# Patient Record
Sex: Female | Born: 1965
Health system: Southern US, Community
[De-identification: ages and names within clinical notes are randomized; demographics above are authoritative.]

## PROBLEM LIST (undated history)

## (undated) DIAGNOSIS — I639 Cerebral infarction, unspecified: Secondary | ICD-10-CM

## (undated) DIAGNOSIS — I119 Hypertensive heart disease without heart failure: Secondary | ICD-10-CM

## (undated) DIAGNOSIS — E785 Hyperlipidemia, unspecified: Secondary | ICD-10-CM

## (undated) DIAGNOSIS — R7303 Prediabetes: Secondary | ICD-10-CM

## (undated) DIAGNOSIS — I10 Essential (primary) hypertension: Secondary | ICD-10-CM

## (undated) DIAGNOSIS — E669 Obesity, unspecified: Secondary | ICD-10-CM

## (undated) DIAGNOSIS — F419 Anxiety disorder, unspecified: Secondary | ICD-10-CM

## (undated) DIAGNOSIS — I21A1 Myocardial infarction type 2: Secondary | ICD-10-CM

## (undated) DIAGNOSIS — I671 Cerebral aneurysm, nonruptured: Secondary | ICD-10-CM

## (undated) DIAGNOSIS — D219 Benign neoplasm of connective and other soft tissue, unspecified: Secondary | ICD-10-CM

## (undated) DIAGNOSIS — D509 Iron deficiency anemia, unspecified: Secondary | ICD-10-CM

## (undated) DIAGNOSIS — N920 Excessive and frequent menstruation with regular cycle: Secondary | ICD-10-CM

## (undated) DIAGNOSIS — K7689 Other specified diseases of liver: Secondary | ICD-10-CM

## (undated) HISTORY — DX: Essential (primary) hypertension: I10

## (undated) HISTORY — DX: Anxiety disorder, unspecified: F41.9

## (undated) HISTORY — PX: CEREBRAL ANEURYSM REPAIR: SHX164

## (undated) HISTORY — DX: Iron deficiency anemia, unspecified: D50.9

## (undated) HISTORY — DX: Benign neoplasm of connective and other soft tissue, unspecified: D21.9

## (undated) HISTORY — DX: Hyperlipidemia, unspecified: E78.5

## (undated) HISTORY — DX: Hypertensive heart disease without heart failure: I11.9

## (undated) HISTORY — DX: Myocardial infarction type 2: I21.A1

## (undated) HISTORY — DX: Prediabetes: R73.03

## (undated) HISTORY — DX: Obesity, unspecified: E66.9

## (undated) HISTORY — DX: Cerebral infarction, unspecified: I63.9

## (undated) HISTORY — DX: Other specified diseases of liver: K76.89

## (undated) HISTORY — DX: Excessive and frequent menstruation with regular cycle: N92.0

## (undated) HISTORY — DX: Cerebral aneurysm, nonruptured: I67.1

---

## 1998-02-19 ENCOUNTER — Emergency Department (HOSPITAL_COMMUNITY): Admission: EM | Admit: 1998-02-19 | Discharge: 1998-02-19 | Payer: Self-pay | Admitting: Emergency Medicine

## 1998-09-06 ENCOUNTER — Emergency Department (HOSPITAL_COMMUNITY): Admission: EM | Admit: 1998-09-06 | Discharge: 1998-09-06 | Payer: Self-pay | Admitting: Internal Medicine

## 1999-07-21 ENCOUNTER — Inpatient Hospital Stay (HOSPITAL_COMMUNITY): Admission: AD | Admit: 1999-07-21 | Discharge: 1999-07-21 | Payer: Self-pay | Admitting: Obstetrics & Gynecology

## 2000-07-26 ENCOUNTER — Emergency Department (HOSPITAL_COMMUNITY): Admission: EM | Admit: 2000-07-26 | Discharge: 2000-07-26 | Payer: Self-pay | Admitting: Emergency Medicine

## 2003-05-01 ENCOUNTER — Emergency Department (HOSPITAL_COMMUNITY): Admission: EM | Admit: 2003-05-01 | Discharge: 2003-05-02 | Payer: Self-pay | Admitting: Emergency Medicine

## 2006-09-23 ENCOUNTER — Emergency Department (HOSPITAL_COMMUNITY): Admission: EM | Admit: 2006-09-23 | Discharge: 2006-09-23 | Payer: Self-pay | Admitting: Emergency Medicine

## 2007-08-11 ENCOUNTER — Inpatient Hospital Stay (HOSPITAL_COMMUNITY): Admission: AD | Admit: 2007-08-11 | Discharge: 2007-08-11 | Payer: Self-pay | Admitting: Obstetrics & Gynecology

## 2009-05-18 ENCOUNTER — Emergency Department (HOSPITAL_COMMUNITY): Admission: EM | Admit: 2009-05-18 | Discharge: 2009-05-18 | Payer: Self-pay | Admitting: Emergency Medicine

## 2013-01-12 ENCOUNTER — Encounter (HOSPITAL_COMMUNITY): Payer: Self-pay | Admitting: *Deleted

## 2013-01-12 ENCOUNTER — Emergency Department (HOSPITAL_COMMUNITY)
Admission: EM | Admit: 2013-01-12 | Discharge: 2013-01-12 | Disposition: A | Discharge status: Home / Self Care | Payer: Self-pay | Attending: Emergency Medicine | Admitting: Emergency Medicine

## 2013-01-12 ENCOUNTER — Emergency Department (HOSPITAL_COMMUNITY): Payer: Self-pay

## 2013-01-12 DIAGNOSIS — D649 Anemia, unspecified: Secondary | ICD-10-CM | POA: Insufficient documentation

## 2013-01-12 DIAGNOSIS — R0602 Shortness of breath: Secondary | ICD-10-CM | POA: Insufficient documentation

## 2013-01-12 DIAGNOSIS — R079 Chest pain, unspecified: Secondary | ICD-10-CM | POA: Insufficient documentation

## 2013-01-12 DIAGNOSIS — I10 Essential (primary) hypertension: Secondary | ICD-10-CM | POA: Insufficient documentation

## 2013-01-12 DIAGNOSIS — R1013 Epigastric pain: Secondary | ICD-10-CM | POA: Insufficient documentation

## 2013-01-12 LAB — CBC WITH DIFFERENTIAL/PLATELET
Basophils Absolute: 0.1 10*3/uL (ref 0.0–0.1)
Basophils Relative: 1 % (ref 0–1)
Eosinophils Absolute: 0.1 10*3/uL (ref 0.0–0.7)
Eosinophils Relative: 2 % (ref 0–5)
HCT: 30 % — ABNORMAL LOW (ref 36.0–46.0)
Hemoglobin: 7.9 g/dL — ABNORMAL LOW (ref 12.0–15.0)
Lymphocytes Relative: 45 % (ref 12–46)
Lymphs Abs: 3.2 10*3/uL (ref 0.7–4.0)
MCH: 15.6 pg — ABNORMAL LOW (ref 26.0–34.0)
MCHC: 26.3 g/dL — ABNORMAL LOW (ref 30.0–36.0)
MCV: 59.3 fL — ABNORMAL LOW (ref 78.0–100.0)
Monocytes Absolute: 0.4 10*3/uL (ref 0.1–1.0)
Monocytes Relative: 6 % (ref 3–12)
Neutro Abs: 3.4 10*3/uL (ref 1.7–7.7)
Neutrophils Relative %: 46 % (ref 43–77)
Platelets: 167 10*3/uL (ref 150–400)
RBC: 5.06 MIL/uL (ref 3.87–5.11)
RDW: 21.1 % — ABNORMAL HIGH (ref 11.5–15.5)
WBC: 7.2 10*3/uL (ref 4.0–10.5)

## 2013-01-12 LAB — COMPREHENSIVE METABOLIC PANEL
ALT: 14 U/L (ref 0–35)
AST: 19 U/L (ref 0–37)
Albumin: 3.5 g/dL (ref 3.5–5.2)
Alkaline Phosphatase: 63 U/L (ref 39–117)
BUN: 8 mg/dL (ref 6–23)
CO2: 23 mEq/L (ref 19–32)
Calcium: 9.4 mg/dL (ref 8.4–10.5)
Chloride: 102 mEq/L (ref 96–112)
Creatinine, Ser: 0.8 mg/dL (ref 0.50–1.10)
GFR calc Af Amer: >90 mL/min (ref >90)
GFR calc non Af Amer: 88 mL/min — ABNORMAL LOW (ref >90)
Glucose, Bld: 87 mg/dL (ref 70–99)
Potassium: 3.4 mEq/L — ABNORMAL LOW (ref 3.5–5.1)
Sodium: 136 mEq/L (ref 135–145)
Total Bilirubin: 0.3 mg/dL (ref 0.3–1.2)
Total Protein: 8 g/dL (ref 6.0–8.3)

## 2013-01-12 LAB — D-DIMER, QUANTITATIVE: D-Dimer, Quant: <0.27 ug/mL-FEU (ref 0.00–0.48)

## 2013-01-12 LAB — POCT I-STAT TROPONIN I
Troponin i, poc: 0 ng/mL (ref 0.00–0.08)
Troponin i, poc: 0 ng/mL (ref 0.00–0.08)

## 2013-01-12 LAB — OCCULT BLOOD, POC DEVICE: Fecal Occult Bld: NEGATIVE

## 2013-01-12 MED ORDER — ASPIRIN 81 MG PO CHEW
324.0000 mg | CHEWABLE_TABLET | Freq: Once | ORAL | Status: AC
  Administered 2013-01-12: 324 mg via ORAL
  Filled 2013-01-12: qty 4

## 2013-01-12 MED ORDER — GI COCKTAIL ~~LOC~~
30.0000 mL | Freq: Once | ORAL | Status: AC
  Administered 2013-01-12: 30 mL via ORAL
  Filled 2013-01-12: qty 30

## 2013-01-12 MED ORDER — FERROUS SULFATE 325 (65 FE) MG PO TABS: 325.0000 mg | ORAL_TABLET | Freq: Every day | ORAL | Status: DC

## 2013-01-12 NOTE — ED Notes (Signed)
Patient is alert and oriented x3.  She is complaining of chest pain that started today and has progressively gotten worse In the last hour.  She states that she has had this issue before but not this bad.  Currently she rates her pain 6 of 10.

## 2013-01-12 NOTE — ED Provider Notes (Signed)
CSN: [id number]     Arrival date & time 01/12/13  0013 History   First MD Initiated Contact with Patient 01/12/13 0035     Chief Complaint  Patient presents with  . Chest Pain   HPI  History provided by patient. Patient is a 47 year old female with prior history of cerebral aneurysm who presents with complaints of chest pain and discomfort. Symptoms first began earlier in the day and had been present through the evening tonight. Symptoms were initially somewhat mild and patient thought they may be related to gas and took 2 TUMS. She denied any skin relief. She does report 1 episode of severe pains with associated shortness of breath symptoms. Pain did radiate some to the back by the shoulder blades.  She did not have any nausea or diaphoresis. This lasted several minutes but currently her symptoms have improved. She denies any continued shortness of breath. There has been no cough or hemoptysis. She has not traveled anywhere recently or had any pain or swelling in the lower extremities. No prior history of DVT or PE. She has no prior history of similar symptoms. Denies any other aggravating or alleviating factors. No other associated symptoms.     No past medical history on file. Past Surgical History  Procedure Laterality Date  . Cerebral aneurysm repair     No family history on file. History  Substance Use Topics  . Smoking status: Never Smoker   . Smokeless tobacco: Not on file  . Alcohol Use: Yes     Comment: rare   OB History   Grav Para Term Preterm Abortions TAB SAB Ect Mult Living                 Review of Systems  Constitutional: Negative for fever, chills and diaphoresis.  Respiratory: Positive for shortness of breath.   Cardiovascular: Positive for chest pain. Negative for palpitations and leg swelling.  Gastrointestinal: Negative for nausea, vomiting, abdominal pain, diarrhea and constipation.  All other systems reviewed and are negative.    Allergies  Review of  patient's allergies indicates no known allergies.  Home Medications  No current outpatient prescriptions on file. BP 161/95  Pulse 75  Temp(Src) 98.7 F (37.1 C) (Oral)  Resp 14  SpO2 100%  LMP 12/15/2012 Physical Exam  Nursing note and vitals reviewed. Constitutional: She is oriented to person, place, and time. She appears well-developed and well-nourished. No distress.  HENT:  Head: Normocephalic and atraumatic.  Mouth/Throat: Oropharynx is clear and moist.  Eyes: Conjunctivae are normal.  Neck: Normal range of motion. Neck supple. No JVD present.  Cardiovascular: Normal rate and regular rhythm.   No murmur heard. Pulmonary/Chest: Effort normal and breath sounds normal. No respiratory distress. She has no wheezes. She has no rales.  Abdominal: Soft. She exhibits no distension. There is no rigidity, no rebound, no guarding, no CVA tenderness, no tenderness at McBurney's point and negative Murphy's sign.  Mild epigastric tenderness.  Musculoskeletal: Normal range of motion. She exhibits no edema and no tenderness.  No clinical signs concerning for DVT  Neurological: She is alert and oriented to person, place, and time.  Skin: Skin is warm and dry. No rash noted.  Psychiatric: She has a normal mood and affect. Her behavior is normal.    ED Course  Procedures   Results for orders placed during the hospital encounter of 01/12/13  CBC WITH DIFFERENTIAL      Result Value Range   WBC 7.2  4.0 -  10.5 K/uL   RBC 5.06  3.87 - 5.11 MIL/uL   Hemoglobin 7.9 (*) 12.0 - 15.0 g/dL   HCT 40.9 (*) 81.1 - 91.4 %   MCV 59.3 (*) 78.0 - 100.0 fL   MCH 15.6 (*) 26.0 - 34.0 pg   MCHC 26.3 (*) 30.0 - 36.0 g/dL   RDW 78.2 (*) 95.6 - 21.3 %   Platelets 167  150 - 400 K/uL   Neutrophils Relative % 46  43 - 77 %   Lymphocytes Relative 45  12 - 46 %   Monocytes Relative 6  3 - 12 %   Eosinophils Relative 2  0 - 5 %   Basophils Relative 1  0 - 1 %   Neutro Abs 3.4  1.7 - 7.7 K/uL   Lymphs Abs  3.2  0.7 - 4.0 K/uL   Monocytes Absolute 0.4  0.1 - 1.0 K/uL   Eosinophils Absolute 0.1  0.0 - 0.7 K/uL   Basophils Absolute 0.1  0.0 - 0.1 K/uL   RBC Morphology TEARDROP CELLS     Smear Review GIANT PLATELETS SEEN    COMPREHENSIVE METABOLIC PANEL      Result Value Range   Sodium 136  135 - 145 mEq/L   Potassium 3.4 (*) 3.5 - 5.1 mEq/L   Chloride 102  96 - 112 mEq/L   CO2 23  19 - 32 mEq/L   Glucose, Bld 87  70 - 99 mg/dL   BUN 8  6 - 23 mg/dL   Creatinine, Ser 0.86  0.50 - 1.10 mg/dL   Calcium 9.4  8.4 - 57.8 mg/dL   Total Protein 8.0  6.0 - 8.3 g/dL   Albumin 3.5  3.5 - 5.2 g/dL   AST 19  0 - 37 U/L   ALT 14  0 - 35 U/L   Alkaline Phosphatase 63  39 - 117 U/L   Total Bilirubin 0.3  0.3 - 1.2 mg/dL   GFR calc non Af Amer 88 (*) >90 mL/min   GFR calc Af Amer >90  >90 mL/min  D-DIMER, QUANTITATIVE      Result Value Range   D-Dimer, Quant <0.27  0.00 - 0.48 ug/mL-FEU  POCT I-STAT TROPONIN I      Result Value Range   Troponin i, poc 0.00  0.00 - 0.08 ng/mL   Comment 3           OCCULT BLOOD, POC DEVICE      Result Value Range   Fecal Occult Bld NEGATIVE  NEGATIVE  POCT I-STAT TROPONIN I      Result Value Range   Troponin i, poc 0.00  0.00 - 0.08 ng/mL   Comment 3              Imaging Review Dg Chest 2 View  01/12/2013   *RADIOLOGY REPORT*  Clinical Data: Chest pain times 1 day.  CHEST - 2 VIEW  Comparison: None available at time of study interpretation.  Findings: The cardiac silhouette is upper limits of normal, mediastinal silhouette is unremarkable.  The lungs are clear without pleural effusions or focal consolidations.  The pulmonary vasculature is unremarkable.   Trachea projects midline and there is no pneumothorax.  The included soft tissue planes and osseous structures are non suspicious; mild lower thoracic levoscoliosis with mild degenerative change.  IMPRESSION: Borderline cardiomegaly, no acute pulmonary process.   Original Report Authenticated By: Awilda Metro     MDM   1. Chest pain  2. Anemia   3. Hypertension      Patient seen and evaluated. The patient well-appearing no acute distress. Symptoms have been present all day long with some waxing and waning. Unrelieved with TUMS. Patient is PERC negative.  Patient without any other history of hypertension, diabetes or hypercholesterolemia. She is a nonsmoker. She does report an aunt who had cardiac history in her 78s. No other significant family history.  2:20AM Patient with low hemoglobin 7.9. I discussed finding with the patient. She reports past history of anemia and was told by her OB/GYN last year during her annual that she had a low hemoglobin level and to take iron. Patient does not take iron regularly. She does not report significantly heavy menstrual bleeding. She reports 4 days of visual. With the first 2 days being heavy bleeding. She has not noticed any dark stools or rectal bleeding. Will have nurse perform Hemoccult.  Labs have been unremarkable. Patient continues to be asymptomatic. At this time we'll discharge home with PCP and cardiology referral.     Date: 01/12/2013  Rate: 75  Rhythm: normal sinus rhythm  QRS Axis: normal  Intervals: normal  ST/T Wave abnormalities: normal  Conduction Disutrbances:none  Narrative Interpretation:   Old EKG Reviewed: none available      Angus Seller, PA-C 01/12/13 [phone]

## 2013-01-12 NOTE — ED Notes (Signed)
Patient transported to X-ray 

## 2013-01-12 NOTE — ED Notes (Signed)
Patient refused occult blood stool. Patient's hgb was discussed in addition to the necessity of the test and the patient states " I am always low, I was lower than that on my last physical. I do not want anything inserted into my rectum, I'm here for chest pain and I actually feel better". Attending will be informed.

## 2013-01-13 NOTE — ED Provider Notes (Signed)
Medical screening examination/treatment/procedure(s) were performed by non-physician practitioner and as supervising physician I was immediately available for consultation/collaboration.  Naylee Frankowski, MD 01/13/13 0027 

## 2013-07-29 ENCOUNTER — Encounter (HOSPITAL_COMMUNITY): Payer: Self-pay | Admitting: Emergency Medicine

## 2013-07-29 ENCOUNTER — Emergency Department (HOSPITAL_COMMUNITY)
Admission: EM | Admit: 2013-07-29 | Discharge: 2013-07-29 | Disposition: A | Discharge status: Home / Self Care | Payer: 59 | Attending: Emergency Medicine | Admitting: Emergency Medicine

## 2013-07-29 ENCOUNTER — Emergency Department (HOSPITAL_COMMUNITY): Payer: 59

## 2013-07-29 DIAGNOSIS — N201 Calculus of ureter: Secondary | ICD-10-CM

## 2013-07-29 DIAGNOSIS — Z862 Personal history of diseases of the blood and blood-forming organs and certain disorders involving the immune mechanism: Secondary | ICD-10-CM | POA: Insufficient documentation

## 2013-07-29 DIAGNOSIS — N83209 Unspecified ovarian cyst, unspecified side: Secondary | ICD-10-CM | POA: Insufficient documentation

## 2013-07-29 DIAGNOSIS — N83202 Unspecified ovarian cyst, left side: Secondary | ICD-10-CM

## 2013-07-29 DIAGNOSIS — R1032 Left lower quadrant pain: Secondary | ICD-10-CM

## 2013-07-29 DIAGNOSIS — Z3202 Encounter for pregnancy test, result negative: Secondary | ICD-10-CM | POA: Insufficient documentation

## 2013-07-29 LAB — COMPREHENSIVE METABOLIC PANEL
ALT: 12 U/L (ref 0–35)
AST: 18 U/L (ref 0–37)
Albumin: 3.1 g/dL — ABNORMAL LOW (ref 3.5–5.2)
Alkaline Phosphatase: 62 U/L (ref 39–117)
BUN: 9 mg/dL (ref 6–23)
CO2: 23 mEq/L (ref 19–32)
Calcium: 8.9 mg/dL (ref 8.4–10.5)
Chloride: 105 mEq/L (ref 96–112)
Creatinine, Ser: 0.88 mg/dL (ref 0.50–1.10)
GFR calc Af Amer: >90 mL/min (ref >90)
GFR calc non Af Amer: 78 mL/min — ABNORMAL LOW (ref >90)
Glucose, Bld: 117 mg/dL — ABNORMAL HIGH (ref 70–99)
Potassium: 3.6 mEq/L — ABNORMAL LOW (ref 3.7–5.3)
Sodium: 142 mEq/L (ref 137–147)
Total Bilirubin: <0.2 mg/dL — ABNORMAL LOW (ref 0.3–1.2)
Total Protein: 7.3 g/dL (ref 6.0–8.3)

## 2013-07-29 LAB — URINALYSIS, ROUTINE W REFLEX MICROSCOPIC
Bilirubin Urine: NEGATIVE
Glucose, UA: NEGATIVE mg/dL
Ketones, ur: 15 mg/dL — AB
Leukocytes, UA: NEGATIVE
Nitrite: NEGATIVE
Protein, ur: 30 mg/dL — AB
Specific Gravity, Urine: 1.034 — ABNORMAL HIGH (ref 1.005–1.030)
Urobilinogen, UA: 1 mg/dL (ref 0.0–1.0)
pH: 5.5 (ref 5.0–8.0)

## 2013-07-29 LAB — CBC
HCT: 31.2 % — ABNORMAL LOW (ref 36.0–46.0)
Hemoglobin: 9 g/dL — ABNORMAL LOW (ref 12.0–15.0)
MCH: 18.4 pg — ABNORMAL LOW (ref 26.0–34.0)
MCHC: 28.8 g/dL — ABNORMAL LOW (ref 30.0–36.0)
MCV: 63.7 fL — ABNORMAL LOW (ref 78.0–100.0)
Platelets: 309 10*3/uL (ref 150–400)
RBC: 4.9 MIL/uL (ref 3.87–5.11)
RDW: 19.5 % — ABNORMAL HIGH (ref 11.5–15.5)
WBC: 6.6 10*3/uL (ref 4.0–10.5)

## 2013-07-29 LAB — URINE MICROSCOPIC-ADD ON

## 2013-07-29 LAB — WET PREP, GENITAL
Trich, Wet Prep: NONE SEEN
Yeast Wet Prep HPF POC: NONE SEEN

## 2013-07-29 LAB — PREGNANCY, URINE: Preg Test, Ur: NEGATIVE

## 2013-07-29 LAB — GC/CHLAMYDIA PROBE AMP
CT Probe RNA: NEGATIVE
GC Probe RNA: NEGATIVE

## 2013-07-29 MED ORDER — HYDROCODONE-ACETAMINOPHEN 5-325 MG PO TABS: 1.0000 | ORAL_TABLET | ORAL | Status: DC | PRN

## 2013-07-29 MED ORDER — TAMSULOSIN HCL 0.4 MG PO CAPS: 0.4000 mg | ORAL_CAPSULE | Freq: Two times a day (BID) | ORAL | Status: DC

## 2013-07-29 MED ORDER — KETOROLAC TROMETHAMINE 60 MG/2ML IM SOLN
60.0000 mg | Freq: Once | INTRAMUSCULAR | Status: AC
  Administered 2013-07-29: 60 mg via INTRAMUSCULAR
  Filled 2013-07-29: qty 2

## 2013-07-29 MED ORDER — PROMETHAZINE HCL 25 MG PO TABS: 25.0000 mg | ORAL_TABLET | Freq: Four times a day (QID) | ORAL | Status: DC | PRN

## 2013-07-29 NOTE — ED Notes (Signed)
Patient states lower abdominal cramping and scant bleeding, patient states unusual for bleeding for her menstrual cycle, patient states d/c with odor, patient states she also feels constipated and like she is retaining water over past 3-4 days

## 2013-07-29 NOTE — ED Provider Notes (Signed)
CSN: [id number]     Arrival date & time 07/29/13  0422 History   First MD Initiated Contact with Patient 07/29/13 [id number]     Chief Complaint  Patient presents with  . Abdominal Pain    x 4 days  . Vaginal Bleeding     (Consider location/radiation/quality/duration/timing/severity/associated sxs/prior Treatment) Patient is a 48 y.o. female presenting with abdominal pain and vaginal bleeding. The history is provided by the patient and medical records. No language interpreter was used.  Abdominal Pain Associated symptoms: vaginal bleeding and vaginal discharge   Associated symptoms: no chest pain, no constipation, no cough, no diarrhea, no dysuria, no fatigue, no fever, no hematuria, no nausea, no shortness of breath and no vomiting   Vaginal Bleeding Associated symptoms: abdominal pain (LLQ) and vaginal discharge   Associated symptoms: no back pain, no dysuria, no fatigue, no fever and no nausea     Jessica Cruz is a 48 y.o. female  with a hx of cerebral aneurysm repair, anemia presents to the Emergency Department complaining of gradual, persistent, progressively worsening LLQ abd pain and cramping onset 4 days ago.  Pt reports the pain is sharp and radiates to her back. She reports Hx of constipation and took laxative this weekend assuming this was the cause of the pain.  Pt reports adequate bowel movements after this, but the pain persists.  Associated symptoms include scant vaginal bleeding and odorous discharge.  Pt reports she missed her March menses which is very unsusal for her.  She is sexually active with 1 female partner, is not using any birth control and denies hx of STD.  Nothing seems to make the symptoms better and movement and palpation makes it worse.  She denies hx of abd surgeries.  Pt denies fever, chills, headache, neck pain, chest pain, SOB, N/V/D, weakness, dizziness, syncope, dysuria, hematuria.      History reviewed. No pertinent past medical history. Past Surgical History   Procedure Laterality Date  . Cerebral aneurysm repair     No family history on file. History  Substance Use Topics  . Smoking status: Never Smoker   . Smokeless tobacco: Not on file  . Alcohol Use: Yes     Comment: rare   OB History   Grav Para Term Preterm Abortions TAB SAB Ect Mult Living                 Review of Systems  Constitutional: Negative for fever, diaphoresis, appetite change, fatigue and unexpected weight change.  HENT: Negative for mouth sores.   Eyes: Negative for visual disturbance.  Respiratory: Negative for cough, chest tightness, shortness of breath and wheezing.   Cardiovascular: Negative for chest pain.  Gastrointestinal: Positive for abdominal pain (LLQ). Negative for nausea, vomiting, diarrhea and constipation.  Endocrine: Negative for polydipsia, polyphagia and polyuria.  Genitourinary: Positive for vaginal bleeding and vaginal discharge. Negative for dysuria, urgency, frequency and hematuria.  Musculoskeletal: Negative for back pain and neck stiffness.  Skin: Negative for rash.  Allergic/Immunologic: Negative for immunocompromised state.  Neurological: Negative for syncope, light-headedness and headaches.  Hematological: Does not bruise/bleed easily.  Psychiatric/Behavioral: Negative for sleep disturbance. The patient is not nervous/anxious.       Allergies  Review of patient's allergies indicates no known allergies.  Home Medications   Current Outpatient Rx  Name  Route  Sig  Dispense  Refill  . HYDROcodone-acetaminophen (NORCO/VICODIN) 5-325 MG per tablet   Oral   Take 1-2 tablets by mouth every 4 (four)  hours as needed for severe pain.   10 tablet   0   . promethazine (PHENERGAN) 25 MG tablet   Oral   Take 1 tablet (25 mg total) by mouth every 6 (six) hours as needed for nausea or vomiting.   12 tablet   0   . tamsulosin (FLOMAX) 0.4 MG CAPS capsule   Oral   Take 1 capsule (0.4 mg total) by mouth 2 (two) times daily.   10  capsule   0    BP 141/84  Pulse 59  Temp(Src) 98.6 F (37 C) (Oral)  Resp 18  Ht 5\' 5"  (1.651 m)  Wt 220 lb (99.791 kg)  BMI 36.61 kg/m2  SpO2 99% Physical Exam  Nursing note and vitals reviewed. Constitutional: She appears well-developed and well-nourished. No distress.  Awake, alert, nontoxic appearance  HENT:  Head: Normocephalic and atraumatic.  Mouth/Throat: Oropharynx is clear and moist. No oropharyngeal exudate.  Eyes: Conjunctivae are normal. No scleral icterus.  Neck: Normal range of motion. Neck supple.  Cardiovascular: Normal rate, regular rhythm, normal heart sounds and intact distal pulses.   No murmur heard. RRR  Pulmonary/Chest: Effort normal and breath sounds normal. No respiratory distress. She has no wheezes.  Abdominal: Soft. Bowel sounds are normal. She exhibits no distension and no mass. There is no hepatosplenomegaly. There is tenderness in the left lower quadrant. There is guarding. There is no rebound and no CVA tenderness. Hernia confirmed negative in the right inguinal area and confirmed negative in the left inguinal area.  LLQ abd tenderness with mild guarding; no rebound or peritoneal signs  Genitourinary: Uterus normal. Pelvic exam was performed with patient supine. There is no rash, tenderness or lesion on the right labia. There is no rash, tenderness or lesion on the left labia. Uterus is not deviated, not fixed and not tender. Cervix exhibits no motion tenderness, no discharge and no friability. Right adnexum displays no mass, no tenderness and no fullness. Left adnexum displays tenderness. Left adnexum displays no mass and no fullness. There is bleeding (scant) around the vagina. No erythema or tenderness around the vagina. No foreign body around the vagina. No signs of injury around the vagina. Vaginal discharge (thick, white, malodorous) found.  L adnexal tenderness without fullness or mass  Musculoskeletal: Normal range of motion. She exhibits no  edema.  Lymphadenopathy:    She has no cervical adenopathy.       Right: No inguinal adenopathy present.       Left: No inguinal adenopathy present.  Neurological: She is alert.  Speech is clear and goal oriented Moves extremities without ataxia  Skin: Skin is warm and dry. No rash noted. She is not diaphoretic.  Psychiatric: She has a normal mood and affect.    ED Course  Procedures (including critical care time) Labs Review Labs Reviewed  WET PREP, GENITAL - Abnormal; Notable for the following:    Clue Cells Wet Prep HPF POC FEW (*)    WBC, Wet Prep HPF POC FEW (*)    All other components within normal limits  URINALYSIS, ROUTINE W REFLEX MICROSCOPIC - Abnormal; Notable for the following:    APPearance CLOUDY (*)    Specific Gravity, Urine 1.034 (*)    Hgb urine dipstick LARGE (*)    Ketones, ur 15 (*)    Protein, ur 30 (*)    All other components within normal limits  URINE MICROSCOPIC-ADD ON - Abnormal; Notable for the following:    Squamous Epithelial /  LPF FEW (*)    Bacteria, UA FEW (*)    All other components within normal limits  CBC - Abnormal; Notable for the following:    Hemoglobin 9.0 (*)    HCT 31.2 (*)    MCV 63.7 (*)    MCH 18.4 (*)    MCHC 28.8 (*)    RDW 19.5 (*)    All other components within normal limits  COMPREHENSIVE METABOLIC PANEL - Abnormal; Notable for the following:    Potassium 3.6 (*)    Glucose, Bld 117 (*)    Albumin 3.1 (*)    Total Bilirubin <0.2 (*)    GFR calc non Af Amer 78 (*)    All other components within normal limits  GC/CHLAMYDIA PROBE AMP  PREGNANCY, URINE  HCG, QUANTITATIVE, PREGNANCY   Imaging Review Ct Abdomen Pelvis Wo Contrast  07/29/2013   CLINICAL DATA:  ABDOMINAL PAIN VAGINAL BLEEDING ABDOMINAL PAIN VAGINAL BLEEDING  EXAM: CT ABDOMEN AND PELVIS WITHOUT CONTRAST  TECHNIQUE: Multidetector CT imaging of the abdomen and pelvis was performed following the standard protocol without intravenous contrast.  COMPARISON:   US PELVIS COMPLETE dated 07/29/2013  FINDINGS: The lung bases are clear  The noncontrast evaluation of the liver, spleen, adrenals, pancreas, kidneys are unremarkable.  The bowel is negative. The appendix is appreciated and is unremarkable.  No abdominal masses, free fluid, loculated fluid collections nor adenopathy.  No abdominal aortic aneurysm.  Findings consistent with left ovarian cysts appreciated on previous ultrasound.  Multiple small phleboliths are identified within the pelvis. The left ureter is poorly visualized distally. There are small 1-2 mm size calcifications appreciated along the expected course of the distal left ureter and a small nonobstructing a distal left ureteral calculus cannot be excluded. There is no evidence of hydronephrosis nor hydroureter on the right or left.  Otherwise no evidence of pelvic free fluid, loculated fluid collections, masses nor adenopathy.  No evidence of abdominal wall nor inguinal hernia.  There no aggressive appearing osseous lesions.  IMPRESSION: No evidence of obstructive uropathy. There are small 1-2 mm sized calcific densities appreciated along the expected course of the distal left ureter. A small nonobstructing distal left ureteral calculus cannot be totally excluded.  Findings consistent with left ovarian cysts.  Otherwise no evidence of abdominal or pelvic pathology.   Electronically Signed   By: Margaree Mackintosh M.D.   On: 07/29/2013 11:31   Dg Abd 1 View  07/29/2013   CLINICAL DATA:  Abdominal pain.  EXAM: ABDOMEN - 1 VIEW  COMPARISON:  None.  FINDINGS: Small calcified scratch has small rounded calcifications in the pelvis, most likely calcified phleboliths. Recommend correlation for flank pain or hematuria to completely exclude distal ureteral stone. Normal bowel gas pattern. No free air organomegaly. No acute bony abnormality.  IMPRESSION: Suspect multiple calcified phleboliths in the anatomic pelvis. Recommend clinical correlation for flank  pain/hematuria to completely exclude ureteral stone.   Electronically Signed   By: Rolm Baptise M.D.   On: 07/29/2013 07:44   US Ob Transvaginal  07/29/2013   CLINICAL DATA:  ABDOMINAL PAIN; LLQ VAGINAL BLEEDING r/o torsion  EXAM: TRANSABDOMINAL AND TRANSVAGINAL ULTRASOUND OF PELVIS  DOPPLER ULTRASOUND OF OVARIES  TECHNIQUE: Both transabdominal and transvaginal ultrasound examinations of the pelvis were performed. Transabdominal technique was performed for global imaging of the pelvis including uterus, ovaries, adnexal regions, and pelvic cul-de-sac.  It was necessary to proceed with endovaginal exam following the transabdominal exam to visualize the uterus, endometrium, ovaries and  adnexal regions. Color and duplex Doppler ultrasound was utilized to evaluate blood flow to the ovaries.  COMPARISON:  None.  FINDINGS: Uterus  Measurements: 9.7 x 6.3 x 6.2 cm. No fibroids or other mass visualized.  Endometrium  Thickness: 6 mm.  No focal abnormality visualized.  Right ovary  Measurements: 2.4 x 2.1 x 3.7 cm. Normal appearance/no adnexal mass.  Left ovary  Measurements: 7.1 x 3.5 x 6.7 cm.  The left ovary contains 2 benign-appearing cysts measuring 3.6 x 2.4 x 4.9 cm and 2.9 x 1.9 x 3.4 cm. These dominant cysts contribute to the increased size of the left ovary which is otherwise unremarkable.  Pulsed Doppler evaluation of both ovaries demonstrates normal low-resistance arterial and venous waveforms.  Other findings  No free fluid.  IMPRESSION: Unremarkable pelvic ultrasound, specifically no sonographic evidence of ovarian torsion.   Electronically Signed   By: Margaree Mackintosh M.D.   On: 07/29/2013 08:24   US Pelvis Complete  07/29/2013   CLINICAL DATA:  ABDOMINAL PAIN; LLQ VAGINAL BLEEDING r/o torsion  EXAM: TRANSABDOMINAL AND TRANSVAGINAL ULTRASOUND OF PELVIS  DOPPLER ULTRASOUND OF OVARIES  TECHNIQUE: Both transabdominal and transvaginal ultrasound examinations of the pelvis were performed. Transabdominal  technique was performed for global imaging of the pelvis including uterus, ovaries, adnexal regions, and pelvic cul-de-sac.  It was necessary to proceed with endovaginal exam following the transabdominal exam to visualize the uterus, endometrium, ovaries and adnexal regions. Color and duplex Doppler ultrasound was utilized to evaluate blood flow to the ovaries.  COMPARISON:  None.  FINDINGS: Uterus  Measurements: 9.7 x 6.3 x 6.2 cm. No fibroids or other mass visualized.  Endometrium  Thickness: 6 mm.  No focal abnormality visualized.  Right ovary  Measurements: 2.4 x 2.1 x 3.7 cm. Normal appearance/no adnexal mass.  Left ovary  Measurements: 7.1 x 3.5 x 6.7 cm.  The left ovary contains 2 benign-appearing cysts measuring 3.6 x 2.4 x 4.9 cm and 2.9 x 1.9 x 3.4 cm. These dominant cysts contribute to the increased size of the left ovary which is otherwise unremarkable.  Pulsed Doppler evaluation of both ovaries demonstrates normal low-resistance arterial and venous waveforms.  Other findings  No free fluid.  IMPRESSION: Unremarkable pelvic ultrasound, specifically no sonographic evidence of ovarian torsion.   Electronically Signed   By: Margaree Mackintosh M.D.   On: 07/29/2013 08:24   [place] Art/ven Flow Abd Pelv Doppler  07/29/2013   CLINICAL DATA:  ABDOMINAL PAIN; LLQ VAGINAL BLEEDING r/o torsion  EXAM: TRANSABDOMINAL AND TRANSVAGINAL ULTRASOUND OF PELVIS  DOPPLER ULTRASOUND OF OVARIES  TECHNIQUE: Both transabdominal and transvaginal ultrasound examinations of the pelvis were performed. Transabdominal technique was performed for global imaging of the pelvis including uterus, ovaries, adnexal regions, and pelvic cul-de-sac.  It was necessary to proceed with endovaginal exam following the transabdominal exam to visualize the uterus, endometrium, ovaries and adnexal regions. Color and duplex Doppler ultrasound was utilized to evaluate blood flow to the ovaries.  COMPARISON:  None.  FINDINGS: Uterus  Measurements: 9.7 x 6.3 x  6.2 cm. No fibroids or other mass visualized.  Endometrium  Thickness: 6 mm.  No focal abnormality visualized.  Right ovary  Measurements: 2.4 x 2.1 x 3.7 cm. Normal appearance/no adnexal mass.  Left ovary  Measurements: 7.1 x 3.5 x 6.7 cm.  The left ovary contains 2 benign-appearing cysts measuring 3.6 x 2.4 x 4.9 cm and 2.9 x 1.9 x 3.4 cm. These dominant cysts contribute to the increased size of the left  ovary which is otherwise unremarkable.  Pulsed Doppler evaluation of both ovaries demonstrates normal low-resistance arterial and venous waveforms.  Other findings  No free fluid.  IMPRESSION: Unremarkable pelvic ultrasound, specifically no sonographic evidence of ovarian torsion.   Electronically Signed   By: Margaree Mackintosh M.D.   On: 07/29/2013 08:24     EKG Interpretation None      MDM   Final diagnoses:  LLQ abdominal pain  Left ovarian cyst  Ureteral stone   Jessica Cruz presents with LLQ abd pain, vaginal discharge and vaginal bleeding.  Pt with L adnexal tenderness and discharge on pelvic exam.  CBC with anemia but no leukocytosis. CMP. Sharing. Wet prep without evidence of infection. Pregnancy test negative.  UA with hemoglobin, increased specific gravity and ketones.  Patient without CVA tenderness on exam. Will obtain ultrasound of the pelvis and KUB to assess for constipation.  Pt declines pain control at this time.    9:00AM Labs returned without leukocytosis. Wet prep without evidence of infection. Pregnancy test negative. Urinalysis with large hemoglobin and increased specific gravity.  Unclear whether or not hematuria is from possible stones versus vaginal bleeding.   KUB shows Suspect multiple calcified phleboliths in the anatomic pelvis.  Ultrasound of pelvis shows 2 benign-appearing cysts on the left ovary but no evidence of torsion.  Will obtain CT without contrast to assess for possible kidney stones versus diverticulitis. Patient remains alert, oriented, nontoxic nonseptic  appearing. She remained afebrile without nausea and vomiting.  9:30AM Pt with increasing pain after [place].  Will give Toradol.    10:28 AM Patient with pain control at this time. CT abdomen pelvis pending.  11:54 AM CT abd pelvis with No evidence of obstructive uropathy. There are small 1-2 mm sized calcific densities appreciated along the expected course of the distal left ureter. Findings consistent with left ovarian cysts.  I personally reviewed the imaging tests through PACS system  I reviewed available ER/hospitalization records through the EMR  Etiology of pt's hematuria is unclear as she has both likely ureteral stones and a scant amount of vaginal bleeding.  Recommended close followup within 3 days the patient's OB/GYN for evaluation of her vaginal bleeding and left lower course abdominal pain in addition to her left ovarian cyst.  Patient will be discharged home with pain control, Flomax and nausea prevention. She is alert and oriented, nontoxic and nonseptic appearing. She is tolerating by mouth here in the department without difficulty.  Patient has been instructed to return to the emergency department for intractable vomiting, increasing pain or high fevers.  It has been determined that no acute conditions requiring further emergency intervention are present at this time. The patient/guardian have been advised of the diagnosis and plan. We have discussed signs and symptoms that warrant return to the ED, such as changes or worsening in symptoms.   Vital signs are stable at discharge.   BP 141/84  Pulse 59  Temp(Src) 98.6 F (37 C) (Oral)  Resp 18  Ht 5\' 5"  (1.651 m)  Wt 220 lb (99.791 kg)  BMI 36.61 kg/m2  SpO2 99%  Patient/guardian has voiced understanding and agreed to follow-up with the PCP or specialist.          Abigail Butts, PA-C 07/29/13 1157

## 2013-07-29 NOTE — Discharge Instructions (Signed)
1. Medications: flomax to help pass stones, vicodin for pain as needed, phenergan for nausea as needed, usual home medications 2. Treatment: rest, drink plenty of fluids,  3. Follow Up: Please followup with OB/GYN in 3 days for discussion of your LLQ abd pain and vaginal bleeding and further evaluation after today's visit; if you do not have a primary care doctor use the resource guide provided to find one;  Return to the ED for worsening pain, high fevers, or vomiting that will not stop  Ovarian Cyst An ovarian cyst is a fluid-filled sac that forms on an ovary. The ovaries are small organs that produce eggs in women. Various types of cysts can form on the ovaries. Most are not cancerous. Many do not cause problems, and they often go away on their own. Some may cause symptoms and require treatment. Common types of ovarian cysts include:  Functional cysts These cysts may occur every month during the menstrual cycle. This is normal. The cysts usually go away with the next menstrual cycle if the woman does not get pregnant. Usually, there are no symptoms with a functional cyst.  Endometrioma cysts These cysts form from the tissue that lines the uterus. They are also called "chocolate cysts" because they become filled with blood that turns brown. This type of cyst can cause pain in the lower abdomen during intercourse and with your menstrual period.  Cystadenoma cysts This type develops from the cells on the outside of the ovary. These cysts can get very big and cause lower abdomen pain and pain with intercourse. This type of cyst can twist on itself, cut off its blood supply, and cause severe pain. It can also easily rupture and cause a lot of pain.  Dermoid cysts This type of cyst is sometimes found in both ovaries. These cysts may contain different kinds of body tissue, such as skin, teeth, hair, or cartilage. They usually do not cause symptoms unless they get very big.  Theca lutein cysts These cysts  occur when too much of a certain hormone (human chorionic gonadotropin) is produced and overstimulates the ovaries to produce an egg. This is most common after procedures used to assist with the conception of a baby (in vitro fertilization). CAUSES   Fertility drugs can cause a condition in which multiple large cysts are formed on the ovaries. This is called ovarian hyperstimulation syndrome.  A condition called polycystic ovary syndrome can cause hormonal imbalances that can lead to nonfunctional ovarian cysts. SIGNS AND SYMPTOMS  Many ovarian cysts do not cause symptoms. If symptoms are present, they may include:  Pelvic pain or pressure.  Pain in the lower abdomen.  Pain during sexual intercourse.  Increasing girth (swelling) of the abdomen.  Abnormal menstrual periods.  Increasing pain with menstrual periods.  Stopping having menstrual periods without being pregnant. DIAGNOSIS  These cysts are commonly found during a routine or annual pelvic exam. Tests may be ordered to find out more about the cyst. These tests may include:  Ultrasound.  X-ray of the pelvis.  CT scan.  MRI.  Blood tests. TREATMENT  Many ovarian cysts go away on their own without treatment. Your health care provider may want to check your cyst regularly for 2 3 months to see if it changes. For women in menopause, it is particularly important to monitor a cyst closely because of the higher rate of ovarian cancer in menopausal women. When treatment is needed, it may include any of the following:  A procedure to  drain the cyst (aspiration). This may be done using a long needle and ultrasound. It can also be done through a laparoscopic procedure. This involves using a thin, lighted tube with a tiny camera on the end (laparoscope) inserted through a small incision.  Surgery to remove the whole cyst. This may be done using laparoscopic surgery or an open surgery involving a larger incision in the lower  abdomen.  Hormone treatment or birth control pills. These methods are sometimes used to help dissolve a cyst. HOME CARE INSTRUCTIONS   Only take over-the-counter or prescription medicines as directed by your health care provider.  Follow up with your health care provider as directed.  Get regular pelvic exams and Pap tests. SEEK MEDICAL CARE IF:   Your periods are late, irregular, or painful, or they stop.  Your pelvic pain or abdominal pain does not go away.  Your abdomen becomes larger or swollen.  You have pressure on your bladder or trouble emptying your bladder completely.  You have pain during sexual intercourse.  You have feelings of fullness, pressure, or discomfort in your stomach.  You lose weight for no apparent reason.  You feel generally ill.  You become constipated.  You lose your appetite.  You develop acne.  You have an increase in body and facial hair.  You are gaining weight, without changing your exercise and eating habits.  You think you are pregnant. SEEK IMMEDIATE MEDICAL CARE IF:   You have increasing abdominal pain.  You feel sick to your stomach (nauseous), and you throw up (vomit).  You develop a fever that comes on suddenly.  You have abdominal pain during a bowel movement.  Your menstrual periods become heavier than usual. Document Released: 04/03/2005 Document Revised: 01/22/2013 Document Reviewed: 12/09/2012 [facility] Patient Information 2014 [place].   Ureteral Colic (Kidney Stones) Ureteral colic is the result of a condition when kidney stones form inside the kidney. Once kidney stones are formed they may move into the tube that connects the kidney with the bladder (ureter). If this occurs, this condition may cause pain (colic) in the ureter.  CAUSES  Pain is caused by stone movement in the ureter and the obstruction caused by the stone. SYMPTOMS  The pain comes and goes as the ureter contracts around the stone. The  pain is usually intense, sharp, and stabbing in character. The location of the pain may move as the stone moves through the ureter. When the stone is near the kidney the pain is usually located in the back and radiates to the belly (abdomen). When the stone is ready to pass into the bladder the pain is often located in the lower abdomen on the side the stone is located. At this location, the symptoms may mimic those of a urinary tract infection with urinary frequency. Once the stone is located here it often passes into the bladder and the pain disappears completely. TREATMENT   Your caregiver will provide you with medicine for pain relief.  You may require specialized follow-up X-rays.  The absence of pain does not always mean that the stone has passed. It may have just stopped moving. If the urine remains completely obstructed, it can cause loss of kidney function or even complete destruction of the involved kidney. It is your responsibility and in your interest that X-rays and follow-ups as suggested by your caregiver are completed. Relief of pain without passage of the stone can be associated with severe damage to the kidney, including loss of kidney  function on that side.  If your stone does not pass on its own, additional measures may be taken by your caregiver to ensure its removal. HOME CARE INSTRUCTIONS   Increase your fluid intake. Water is the preferred fluid since juices containing vitamin C may acidify the urine making it less likely for certain stones (uric acid stones) to pass.  Strain all urine. A strainer will be provided. Keep all particulate matter or stones for your caregiver to inspect.  Take your pain medicine as directed.  Make a follow-up appointment with your caregiver as directed.  Remember that the goal is passage of your stone. The absence of pain does not mean the stone is gone. Follow your caregiver's instructions.  Only take over-the-counter or prescription  medicines for pain, discomfort, or fever as directed by your caregiver. SEEK MEDICAL CARE IF:   Pain cannot be controlled with the prescribed medicine.  You have a fever.  Pain continues for longer than your caregiver advises it should.  There is a change in the pain, and you develop chest discomfort or constant abdominal pain.  You feel faint or pass out. MAKE SURE YOU:   Understand these instructions.  Will watch your condition.  Will get help right away if you are not doing well or get worse. Document Released: 01/11/2005 Document Revised: 07/29/2012 Document Reviewed: 09/28/2010 [facility] Patient Information 2014 [place], [place].

## 2013-07-30 NOTE — ED Provider Notes (Signed)
History/physical exam/procedure(s) were performed by non-physician practitioner and as supervising physician I was immediately available for consultation/collaboration. I have reviewed all notes and am in agreement with care and plan.   Shaune Pollack, MD 07/30/13 [phone]

## 2013-11-12 ENCOUNTER — Emergency Department (HOSPITAL_COMMUNITY): Payer: 59

## 2013-11-12 ENCOUNTER — Emergency Department (HOSPITAL_COMMUNITY)
Admission: EM | Admit: 2013-11-12 | Discharge: 2013-11-12 | Disposition: A | Discharge status: Home / Self Care | Payer: 59 | Attending: Emergency Medicine | Admitting: Emergency Medicine

## 2013-11-12 ENCOUNTER — Encounter (HOSPITAL_COMMUNITY): Payer: Self-pay | Admitting: Emergency Medicine

## 2013-11-12 DIAGNOSIS — G44209 Tension-type headache, unspecified, not intractable: Secondary | ICD-10-CM | POA: Diagnosis not present

## 2013-11-12 DIAGNOSIS — R51 Headache: Secondary | ICD-10-CM | POA: Insufficient documentation

## 2013-11-12 DIAGNOSIS — Z79899 Other long term (current) drug therapy: Secondary | ICD-10-CM | POA: Insufficient documentation

## 2013-11-12 MED ORDER — KETOROLAC TROMETHAMINE 30 MG/ML IJ SOLN
30.0000 mg | Freq: Once | INTRAMUSCULAR | Status: AC
  Administered 2013-11-12: 30 mg via INTRAMUSCULAR

## 2013-11-12 MED ORDER — IBUPROFEN 600 MG PO TABS: 600.0000 mg | ORAL_TABLET | Freq: Four times a day (QID) | ORAL | Status: DC | PRN

## 2013-11-12 MED ORDER — KETOROLAC TROMETHAMINE 30 MG/ML IJ SOLN
30.0000 mg | Freq: Once | INTRAMUSCULAR | Status: DC
  Filled 2013-11-12: qty 1

## 2013-11-12 MED ORDER — METOCLOPRAMIDE HCL 5 MG/ML IJ SOLN
10.0000 mg | Freq: Once | INTRAMUSCULAR | Status: DC
  Filled 2013-11-12: qty 2

## 2013-11-12 MED ORDER — METOCLOPRAMIDE HCL 5 MG/ML IJ SOLN
10.0000 mg | Freq: Once | INTRAMUSCULAR | Status: AC
  Administered 2013-11-12: 10 mg via INTRAMUSCULAR

## 2013-11-12 MED ORDER — SODIUM CHLORIDE 0.9 % IV BOLUS (SEPSIS): 250.0000 mL | Freq: Once | INTRAVENOUS | Status: DC

## 2013-11-12 NOTE — Discharge Instructions (Signed)

## 2013-11-12 NOTE — ED Provider Notes (Signed)
CSN: [id number]     Arrival date & time 11/12/13  1915 History   First MD Initiated Contact with Patient 11/12/13 1957     Chief Complaint  Patient presents with  . Headache     (Consider location/radiation/quality/duration/timing/severity/associated sxs/prior Treatment) HPI Comments: Patient reports, with a two-week history of neck pain, that she is attributing to an old pillow.  The pain is in her neck, bilaterally, and across her shoulders, and she also reports, that for the past 2-3, days.  She's had intermittent, right frontal headache.  She is tried Tylenol, once or twice, but nothing on a consistent basis.  She also states, that she had some nausea associated with this headache.  She denies any photophobia, sinus congestion, rhinitis, wrote, myalgias. She does, state that while she was in the store today.  Bonnie dipstick.  She had trouble ascertaining called a but is able to read for herself and she's had no slurred speech.  No difficulty swallowing.  No ataxia.  No depth perception difficulties  Patient is a 48 y.o. female presenting with headaches. The history is provided by the patient.  Headache Pain location:  Frontal Quality:  Dull Radiates to:  Does not radiate Severity currently:  5/10 Severity at highest:  9/10 Onset quality:  Gradual Timing:  Intermittent Progression:  Unchanged Chronicity:  New Similar to prior headaches: yes   Context: not exposure to bright light, not coughing, not eating and not straining   Relieved by:  Nothing Worsened by:  Nothing tried Ineffective treatments:  Acetaminophen Associated symptoms: neck pain   Associated symptoms: no pain, no facial pain, no fever, no focal weakness, no hearing loss, no loss of balance, no myalgias, no numbness, no paresthesias, no photophobia, no sinus pressure, no sore throat and no weakness     History reviewed. No pertinent past medical history. Past Surgical History  Procedure Laterality Date  . Cerebral  aneurysm repair     No family history on file. History  Substance Use Topics  . Smoking status: Never Smoker   . Smokeless tobacco: Not on file  . Alcohol Use: Yes     Comment: rare   OB History   Grav Para Term Preterm Abortions TAB SAB Ect Mult Living                 Review of Systems  Constitutional: Negative for fever.  HENT: Negative for hearing loss, sinus pressure and sore throat.   Eyes: Negative for photophobia and pain.  Respiratory: Negative for shortness of breath.   Cardiovascular: Negative for chest pain.  Genitourinary: Negative for dysuria.  Musculoskeletal: Positive for neck pain. Negative for myalgias.  Skin: Negative for rash.  Neurological: Positive for headaches. Negative for focal weakness, numbness, paresthesias and loss of balance.  All other systems reviewed and are negative.     Allergies  Review of patient's allergies indicates no known allergies.  Home Medications   Prior to Admission medications   Medication Sig Start Date End Date Taking? Authorizing Provider  tamsulosin (FLOMAX) 0.4 MG CAPS capsule Take 0.4 mg by mouth 2 (two) times daily.   Yes Historical Provider, MD  ibuprofen (ADVIL,MOTRIN) 600 MG tablet Take 1 tablet (600 mg total) by mouth every 6 (six) hours as needed. 11/12/13   Garald Balding, NP   BP 148/92  Pulse 77  Temp(Src) 98.8 F (37.1 C) (Oral)  Resp 18  Ht 5\' 6"  (1.676 m)  Wt 230 lb (104.327 kg)  BMI 37.14  kg/m2  SpO2 100%  LMP 10/18/2013 Physical Exam  Nursing note and vitals reviewed. Constitutional: She is oriented to person, place, and time. She appears well-developed and well-nourished.  HENT:  Head: Normocephalic.  Right Ear: External ear normal.  Left Ear: External ear normal.  Eyes: Pupils are equal, round, and reactive to light.  Neck: Normal range of motion.  Cardiovascular: Normal rate.   Pulmonary/Chest: Effort normal.  Musculoskeletal: Normal range of motion. She exhibits tenderness. She exhibits  no edema.  Lymphadenopathy:    She has no cervical adenopathy.  Neurological: She is alert and oriented to person, place, and time. No cranial nerve deficit. Coordination normal.  Skin: Skin is warm and dry. No rash noted.    ED Course  Procedures (including critical care time) Labs Review Labs Reviewed - No data to display  Imaging Review Ct Head (brain) Wo Contrast  11/12/2013   CLINICAL DATA:  Headaches  EXAM: CT HEAD WITHOUT CONTRAST  TECHNIQUE: Contiguous axial images were obtained from the base of the skull through the vertex without intravenous contrast.  COMPARISON:  None.  FINDINGS: Bony calvarium shows postoperative changes on the left with evidence of aneurysm clipping. No acute hemorrhage, acute infarction or space-occupying mass lesion is noted.  IMPRESSION: Postsurgical changes are seen.  No acute abnormality is noted.   Electronically Signed   By: Inez Catalina M.D.   On: 11/12/2013 20:10     EKG Interpretation None      MDM  Patient has a normal, neuro examination, and a normal CT scan we'll treat patient with headache Reglan and Toradol and reevaluate headaceh improved  Final diagnoses:  Tension-type headache, not intractable, unspecified chronicity pattern         Garald Balding, NP 11/12/13 2156

## 2013-11-12 NOTE — ED Notes (Signed)
Pt. reports intermittent headache for 1 week , occasional nausea/vomitting , denies injury .

## 2013-11-12 NOTE — ED Notes (Signed)
IV attempted x 1 without success.  Thedore Mins, Charge RN to attempt [place] IV.

## 2013-11-13 NOTE — ED Provider Notes (Signed)
  Medical screening examination/treatment/procedure(s) were performed by non-physician practitioner and as supervising physician I was immediately available for consultation/collaboration.   EKG Interpretation None         Carmin Muskrat, MD 11/13/13 [phone]

## 2016-02-15 ENCOUNTER — Emergency Department (HOSPITAL_COMMUNITY): Payer: 59

## 2016-02-15 ENCOUNTER — Encounter (HOSPITAL_COMMUNITY): Payer: Self-pay | Admitting: *Deleted

## 2016-02-15 ENCOUNTER — Inpatient Hospital Stay (HOSPITAL_COMMUNITY)
Admission: EM | Admit: 2016-02-15 | Discharge: 2016-02-18 | DRG: 287 | Disposition: A | Discharge status: Home / Self Care | Payer: 59 | Attending: Internal Medicine | Admitting: Internal Medicine

## 2016-02-15 DIAGNOSIS — N920 Excessive and frequent menstruation with regular cycle: Secondary | ICD-10-CM | POA: Diagnosis present

## 2016-02-15 DIAGNOSIS — I248 Other forms of acute ischemic heart disease: Secondary | ICD-10-CM | POA: Diagnosis present

## 2016-02-15 DIAGNOSIS — Z87442 Personal history of urinary calculi: Secondary | ICD-10-CM | POA: Diagnosis not present

## 2016-02-15 DIAGNOSIS — R0902 Hypoxemia: Secondary | ICD-10-CM

## 2016-02-15 DIAGNOSIS — I10 Essential (primary) hypertension: Secondary | ICD-10-CM | POA: Diagnosis present

## 2016-02-15 DIAGNOSIS — Z8249 Family history of ischemic heart disease and other diseases of the circulatory system: Secondary | ICD-10-CM | POA: Diagnosis not present

## 2016-02-15 DIAGNOSIS — I249 Acute ischemic heart disease, unspecified: Secondary | ICD-10-CM | POA: Diagnosis present

## 2016-02-15 DIAGNOSIS — K7689 Other specified diseases of liver: Secondary | ICD-10-CM | POA: Diagnosis present

## 2016-02-15 DIAGNOSIS — D500 Iron deficiency anemia secondary to blood loss (chronic): Secondary | ICD-10-CM | POA: Diagnosis present

## 2016-02-15 DIAGNOSIS — R079 Chest pain, unspecified: Secondary | ICD-10-CM

## 2016-02-15 DIAGNOSIS — I214 Non-ST elevation (NSTEMI) myocardial infarction: Secondary | ICD-10-CM

## 2016-02-15 DIAGNOSIS — I161 Hypertensive emergency: Secondary | ICD-10-CM | POA: Diagnosis present

## 2016-02-15 LAB — DIFFERENTIAL
Basophils Absolute: 0 10*3/uL (ref 0.0–0.1)
Basophils Relative: 0 %
Eosinophils Absolute: 0.1 10*3/uL (ref 0.0–0.7)
Eosinophils Relative: 2 %
Lymphocytes Relative: 37 %
Lymphs Abs: 2.5 10*3/uL (ref 0.7–4.0)
Monocytes Absolute: 0.3 10*3/uL (ref 0.1–1.0)
Monocytes Relative: 5 %
Neutro Abs: 3.9 10*3/uL (ref 1.7–7.7)
Neutrophils Relative %: 56 %

## 2016-02-15 LAB — CBC
HCT: 27.3 % — ABNORMAL LOW (ref 36.0–46.0)
Hemoglobin: 7.2 g/dL — ABNORMAL LOW (ref 12.0–15.0)
MCH: 15.8 pg — ABNORMAL LOW (ref 26.0–34.0)
MCHC: 26.4 g/dL — ABNORMAL LOW (ref 30.0–36.0)
MCV: 60 fL — ABNORMAL LOW (ref 78.0–100.0)
Platelets: 297 10*3/uL (ref 150–400)
RBC: 4.55 MIL/uL (ref 3.87–5.11)
RDW: 19.3 % — ABNORMAL HIGH (ref 11.5–15.5)
WBC: 6.8 10*3/uL (ref 4.0–10.5)

## 2016-02-15 LAB — LIPID PANEL
Cholesterol: 196 mg/dL (ref 0–200)
HDL: 33 mg/dL — ABNORMAL LOW (ref >40)
LDL Cholesterol: 131 mg/dL — ABNORMAL HIGH (ref 0–99)
Total CHOL/HDL Ratio: 5.9 RATIO
Triglycerides: 158 mg/dL — ABNORMAL HIGH (ref <150)
VLDL: 32 mg/dL (ref 0–40)

## 2016-02-15 LAB — COMPREHENSIVE METABOLIC PANEL
ALT: 16 U/L (ref 14–54)
AST: 21 U/L (ref 15–41)
Albumin: 3.4 g/dL — ABNORMAL LOW (ref 3.5–5.0)
Alkaline Phosphatase: 58 U/L (ref 38–126)
Anion gap: 6 (ref 5–15)
BUN: 9 mg/dL (ref 6–20)
CO2: 23 mmol/L (ref 22–32)
Calcium: 8.5 mg/dL — ABNORMAL LOW (ref 8.9–10.3)
Chloride: 108 mmol/L (ref 101–111)
Creatinine, Ser: 0.74 mg/dL (ref 0.44–1.00)
GFR calc Af Amer: >60 mL/min (ref >60)
GFR calc non Af Amer: >60 mL/min (ref >60)
Glucose, Bld: 138 mg/dL — ABNORMAL HIGH (ref 65–99)
Potassium: 3.1 mmol/L — ABNORMAL LOW (ref 3.5–5.1)
Sodium: 137 mmol/L (ref 135–145)
Total Bilirubin: 0.6 mg/dL (ref 0.3–1.2)
Total Protein: 7.3 g/dL (ref 6.5–8.1)

## 2016-02-15 LAB — PROTIME-INR
INR: 1.13
Prothrombin Time: 14.5 seconds (ref 11.4–15.2)

## 2016-02-15 LAB — TROPONIN I: Troponin I: 0.29 ng/mL (ref <0.03)

## 2016-02-15 LAB — APTT: aPTT: 28 seconds (ref 24–36)

## 2016-02-15 MED ORDER — IOPAMIDOL (ISOVUE-370) INJECTION 76%
INTRAVENOUS | Status: AC
  Filled 2016-02-15: qty 100

## 2016-02-15 MED ORDER — IOPAMIDOL (ISOVUE-370) INJECTION 76%
100.0000 mL | Freq: Once | INTRAVENOUS | Status: AC | PRN
  Administered 2016-02-15: 100 mL via INTRAVENOUS

## 2016-02-15 MED ORDER — NITROGLYCERIN 0.4 MG SL SUBL
0.4000 mg | SUBLINGUAL_TABLET | SUBLINGUAL | Status: DC | PRN
  Administered 2016-02-15 (×2): 0.4 mg via SUBLINGUAL
  Filled 2016-02-15 (×3): qty 1

## 2016-02-15 MED ORDER — HEPARIN SODIUM (PORCINE) 5000 UNIT/ML IJ SOLN: 4000.0000 [IU] | Freq: Once | INTRAMUSCULAR | Status: DC

## 2016-02-15 MED ORDER — NITROGLYCERIN IN D5W 200-5 MCG/ML-% IV SOLN
INTRAVENOUS | Status: AC
  Administered 2016-02-15: 5 ug via INTRAVENOUS
  Filled 2016-02-15: qty 250

## 2016-02-15 MED ORDER — NITROGLYCERIN IN D5W 200-5 MCG/ML-% IV SOLN
0.0000 ug/min | Freq: Once | INTRAVENOUS | Status: AC
  Administered 2016-02-15: 5 ug via INTRAVENOUS

## 2016-02-15 MED ORDER — ASPIRIN 81 MG PO CHEW
324.0000 mg | CHEWABLE_TABLET | Freq: Once | ORAL | Status: AC
  Administered 2016-02-15: 324 mg via ORAL

## 2016-02-15 MED ORDER — ONDANSETRON HCL 4 MG/2ML IJ SOLN
INTRAMUSCULAR | Status: AC
  Administered 2016-02-15: 4 mg
  Filled 2016-02-15: qty 2

## 2016-02-15 NOTE — ED Provider Notes (Signed)
[facility] DEPT Provider Note   CSN: [id number] Arrival date & time: 02/15/16  2056     History   Chief Complaint Chief Complaint  Patient presents with  . Chest Pain    HPI Jessica Cruz is a 50 y.o. female.  Patient presents with intermittent left chest pressure radiating to her upper back and left arm, worsened over the last 1-2 hours prior to admission. Denies dyspnea. She has no significant past medical history and denies history of heart disease or hypertension. Does have a strong family history of heart disease as her father died of an MI in his 63s.    The history is provided by the patient. No language interpreter was used.  Chest Pain   This is a new problem. The current episode started 12 to 24 hours ago. The problem occurs constantly. The problem has been gradually worsening. The pain is associated with rest. The pain is present in the substernal region. The pain is at a severity of 8/10. The pain is moderate. The quality of the pain is described as heavy and pressure-like. The pain radiates to the upper back and left arm. Duration of episode(s) is 2 days. The symptoms are aggravated by deep breathing. Associated symptoms include back pain, nausea and vomiting. Pertinent negatives include no abdominal pain, no cough, no dizziness, no fever, no headaches and no shortness of breath. She has tried rest for the symptoms. The treatment provided no relief. There are no known risk factors.  Pertinent negatives for past medical history include no CAD, no hypertension and no MI.  Her family medical history is significant for early MI.  Procedure history is negative for cardiac catheterization, stress echo and exercise treadmill test.    History reviewed. No pertinent past medical history.  Patient Active Problem List   Diagnosis Date Noted  . ACS (acute coronary syndrome) ([place]) 02/16/2016  . Hypertension 02/16/2016  . Hypertensive emergency   . Hypoxemia   . Chest pain     . NSTEMI (non-ST elevated myocardial infarction) [facility])     Past Surgical History:  Procedure Laterality Date  . CARDIAC CATHETERIZATION N/A 02/16/2016   Procedure: Left Heart Cath and Coronary Angiography;  Surgeon: Wellington Hampshire, MD;  Location: [facility];  Service: Cardiovascular;  Laterality: N/A;  . CEREBRAL ANEURYSM REPAIR      OB History    No data available       Home Medications    Prior to Admission medications   Medication Sig Start Date End Date Taking? Authorizing Provider  acetaminophen (TYLENOL) 325 MG tablet Take 325-650 mg by mouth every 6 (six) hours as needed for headache.   Yes Historical Provider, MD  diphenhydramine-acetaminophen (TYLENOL PM EXTRA STRENGTH) 25-500 MG TABS tablet Take 1 tablet by mouth at bedtime as needed (for sleep).   Yes Historical Provider, MD  ibuprofen (ADVIL,MOTRIN) 600 MG tablet Take 1 tablet (600 mg total) by mouth every 6 (six) hours as needed. Patient not taking: Reported on 02/15/2016 11/12/13   Junius Creamer, NP    Family History Family History  Problem Relation Age of Onset  . Heart attack Father 61  . Hypertension Brother     Social History Social History  Substance Use Topics  . Smoking status: Never Smoker  . Smokeless tobacco: Never Used  . Alcohol use Yes     Comment: rare     Allergies   Pineapple and Shellfish allergy   Review of Systems Review of Systems  Constitutional: Negative for fever.  HENT: Negative.   Respiratory: Negative for cough and shortness of breath.   Cardiovascular: Positive for chest pain.  Gastrointestinal: Positive for nausea and vomiting. Negative for abdominal pain.  Genitourinary: Negative.   Musculoskeletal: Positive for back pain.  Skin: Negative.   Neurological: Negative for dizziness and headaches.  Hematological: Does not bruise/bleed easily.  Psychiatric/Behavioral: Negative.      Physical Exam Updated Vital Signs BP (!) 158/104   Pulse 88   Temp 97.8 F  (36.6 C) (Oral)   Resp 20   Ht 5\' 5"  (1.651 m)   Wt 105.1 kg   LMP 01/15/2016   SpO2 100%   BMI 38.56 kg/m   Physical Exam  Constitutional: She is oriented to person, place, and time. She appears well-developed and well-nourished. No distress.  HENT:  Head: Normocephalic and atraumatic.  Eyes: Conjunctivae and EOM are normal. No scleral icterus.  Neck: Normal range of motion. Neck supple.  Cardiovascular: Normal rate, regular rhythm, normal heart sounds and intact distal pulses.  Exam reveals no friction rub.   No murmur heard. Pulses:      Radial pulses are 2+ on the right side, and 2+ on the left side.  Equal bilateral radial pulses  Pulmonary/Chest: Effort normal and breath sounds normal. No accessory muscle usage. She has no decreased breath sounds. She has no wheezes. She has no rhonchi. She has no rales.  Abdominal: Soft. She exhibits no distension. There is no tenderness. There is no guarding.  Neurological: She is alert and oriented to person, place, and time.  Skin: Skin is warm and dry. No rash noted. She is not diaphoretic. No pallor.  Psychiatric: She has a normal mood and affect. Her behavior is normal. Judgment and thought content normal.     ED Treatments / Results  Labs (all labs ordered are listed, but only abnormal results are displayed) Labs Reviewed  CBC - Abnormal; Notable for the following:       Result Value   Hemoglobin 7.2 (*)    HCT 27.3 (*)    MCV 60.0 (*)    MCH 15.8 (*)    MCHC 26.4 (*)    RDW 19.3 (*)    All other components within normal limits  COMPREHENSIVE METABOLIC PANEL - Abnormal; Notable for the following:    Potassium 3.1 (*)    Glucose, Bld 138 (*)    Calcium 8.5 (*)    Albumin 3.4 (*)    All other components within normal limits  TROPONIN I - Abnormal; Notable for the following:    Troponin I 0.29 (*)    All other components within normal limits  LIPID PANEL - Abnormal; Notable for the following:    Triglycerides 158 (*)     HDL 33 (*)    LDL Cholesterol 131 (*)    All other components within normal limits  RAPID URINE DRUG SCREEN, HOSP PERFORMED - Abnormal; Notable for the following:    Benzodiazepines POSITIVE (*)    All other components within normal limits  TROPONIN I - Abnormal; Notable for the following:    Troponin I 0.68 (*)    All other components within normal limits  LIPID PANEL - Abnormal; Notable for the following:    Cholesterol 206 (*)    HDL 39 (*)    LDL Cholesterol 152 (*)    All other components within normal limits  BASIC METABOLIC PANEL - Abnormal; Notable for the following:    Glucose,  Bld 140 (*)    Calcium 8.6 (*)    All other components within normal limits  TROPONIN I - Abnormal; Notable for the following:    Troponin I 2.11 (*)    All other components within normal limits  TROPONIN I - Abnormal; Notable for the following:    Troponin I 3.43 (*)    All other components within normal limits  CBC - Abnormal; Notable for the following:    Hemoglobin 7.2 (*)    HCT 27.9 (*)    MCV 60.1 (*)    MCH 15.5 (*)    MCHC 25.8 (*)    RDW 19.1 (*)    All other components within normal limits  IRON AND TIBC - Abnormal; Notable for the following:    Iron 13 (*)    Saturation Ratios 3 (*)    All other components within normal limits  FERRITIN - Abnormal; Notable for the following:    Ferritin 4 (*)    All other components within normal limits  MRSA PCR SCREENING  DIFFERENTIAL  PROTIME-INR  APTT  VITAMIN B12  FOLATE  RETICULOCYTES  HEMOGLOBIN A1C  TROPONIN I  CBC  I-STAT BETA HCG BLOOD, ED ([facility], [facility], [facility] ONLY)  POC URINE PREG, ED  TYPE AND SCREEN  ABO/RH  PREPARE RBC (CROSSMATCH)    EKG  EKG Interpretation  Date/Time:  Tuesday February 15 2016 21:17:16 EDT Ventricular Rate:  107 PR Interval:    QRS Duration: 87 QT Interval:  374 QTC Calculation: 499 R Axis:   3 Text Interpretation:  Sinus tachycardia Borderline ST depression, lateral leads ST elevation, consider  inferior injury Borderline prolonged QT interval Confirmed by Maryan Rued  MD, WHITNEY ([id number]) on 02/15/2016 9:23:52 PM       Radiology Dg Chest Portable 1 View  Result Date: 02/15/2016 CLINICAL DATA:  Worsening chest pain and dyspnea for 1 day. EXAM: PORTABLE CHEST 1 VIEW COMPARISON:  01/12/2013 FINDINGS: A single AP portable view of the chest demonstrates no focal airspace consolidation or alveolar edema. The lungs are grossly clear. There is no large effusion or pneumothorax. Cardiac and mediastinal contours appear unremarkable. IMPRESSION: No active disease. Electronically Signed   By: Andreas Newport M.D.   On: 02/15/2016 21:38   Ct Angio Chest/abd/pel For Dissection W And/or Wo Contrast  Result Date: 02/15/2016 CLINICAL DATA:  50 y/o F; chest pain and left arm pain to the back and vomiting since this morning. EXAM: CT ANGIOGRAPHY CHEST, ABDOMEN AND PELVIS TECHNIQUE: Multidetector CT imaging through the chest, abdomen and pelvis was performed using the standard protocol during bolus administration of intravenous contrast. Multiplanar reconstructed images and MIPs were obtained and reviewed to evaluate the vascular anatomy. CONTRAST:  100 cc Isovue 370 COMPARISON:  07/29/2013 CT of abdomen and pelvis. FINDINGS: CTA CHEST FINDINGS Cardiovascular: Preferential opacification of the thoracic aorta. No evidence of thoracic aortic aneurysm or dissection. Normal heart size. No pericardial effusion. Normal caliber main pulmonary artery. No central or lobar pulmonary embolus. Mediastinum/Nodes: No enlarged mediastinal, hilar, or axillary lymph nodes. Thyroid gland, trachea, and esophagus demonstrate no significant findings. Lungs/Pleura: Lungs are clear. No pleural effusion or pneumothorax. Musculoskeletal: No chest wall abnormality. No acute or significant osseous findings. Review of the MIP images confirms the above findings. CTA ABDOMEN AND PELVIS FINDINGS VASCULAR Aorta: Normal caliber aorta without  aneurysm, dissection, vasculitis or significant stenosis. Celiac: Patent without evidence of aneurysm, dissection, vasculitis or significant stenosis. SMA: Patent without evidence of aneurysm, dissection, vasculitis or significant stenosis.  Renals: Both renal arteries are patent without evidence of aneurysm, dissection, vasculitis, fibromuscular dysplasia or significant stenosis. IMA: Patent without evidence of aneurysm, dissection, vasculitis or significant stenosis. Inflow: Patent without evidence of aneurysm, dissection, vasculitis or significant stenosis. Veins: No obvious venous abnormality within the limitations of this arterial phase study. Review of the MIP images confirms the above findings. NON-VASCULAR Hepatobiliary: 6 mm hyper attenuating focus in segment 2 (series 7, image 66). Otherwise no focal liver abnormality is seen. No gallstones, gallbladder wall thickening, or biliary dilatation. Pancreas: Unremarkable. No pancreatic ductal dilatation or surrounding inflammatory changes. Spleen: Normal in size without focal abnormality. Several small splenules anterior to the spleen the largest measuring 17 mm per Adrenals/Urinary Tract: Adrenal glands are unremarkable. Kidneys are normal, without renal calculi, focal lesion, or hydronephrosis. Bladder is unremarkable. Stomach/Bowel: Stomach is within normal limits. Appendix appears normal. No evidence of bowel wall thickening, distention, or inflammatory changes. Lymphatic: No significant vascular findings are present. No enlarged abdominal or pelvic lymph nodes. Reproductive: Uterus and bilateral adnexa are unremarkable. Other: No abdominal wall hernia or abnormality. No abdominopelvic ascites. Musculoskeletal: No acute or significant osseous findings. Right fourth lateral rib a bone island. Mild thoracic spine endplate degenerative changes of lower lumbar facet arthrosis. Review of the MIP images confirms the above findings. IMPRESSION: 1. No acute finding  as explanation for pain. 2. Normal aorta without evidence for aneurysm, dissection, vasculitis, or significant stenosis. 3. 6 mm hyperattenuating focus in liver segment 2, probably benign. In a low risk patient no additional follow-up is necessary. In a high risk patient follow-up with MRI in 3-6 months is recommended. This recommendation follows ACR consensus guidelines: Management of Incidental Liver Lesions on CT: A White Paper of the ACR Incidental Findings Committee. J Am Coll Radiol 2017; article currently in press. 4. Otherwise unremarkable CTA of chest abdomen and pelvis. Electronically Signed   By: Kristine Garbe M.D.   On: 02/15/2016 23:55    Procedures Procedures (including critical care time)  Medications Ordered in ED Medications  nitroGLYCERIN (NITROSTAT) SL tablet 0.4 mg ( Sublingual MAR Unhold 02/16/16 0944)  iopamidol (ISOVUE-370) 76 % injection (not administered)  lisinopril (PRINIVIL,ZESTRIL) tablet 10 mg (10 mg Oral Given 02/16/16 1129)  aspirin chewable tablet 324 mg ( Oral MAR Unhold 02/16/16 0944)    Or  aspirin suppository 300 mg ( Rectal MAR Unhold 02/16/16 0944)  aspirin EC tablet 81 mg ( Oral MAR Unhold 02/16/16 0944)  acetaminophen (TYLENOL) tablet 650 mg ( Oral MAR Unhold 02/16/16 0944)  ondansetron (ZOFRAN) injection 4 mg ( Intravenous MAR Unhold 02/16/16 0944)  morphine 4 MG/ML injection 2 mg ( Intravenous MAR Unhold 02/16/16 0944)  morphine 4 MG/ML injection (not administered)  nitroGLYCERIN 50 mg in dextrose 5 % 250 mL (0.2 mg/mL) infusion (0 mcg/min Intravenous Stopped 02/16/16 1148)  atorvastatin (LIPITOR) tablet 80 mg ( Oral MAR Unhold 02/16/16 0944)  sodium chloride flush (NS) 0.9 % injection 3 mL (3 mLs Intravenous Given 02/16/16 1330)  sodium chloride flush (NS) 0.9 % injection 3 mL (not administered)  0.9 %  sodium chloride infusion (not administered)  0.9 %  sodium chloride infusion ( Intravenous Rate/Dose Change 02/16/16 0945)  carvedilol (COREG)  tablet 6.25 mg (not administered)  enoxaparin (LOVENOX) injection 40 mg (not administered)  ferumoxytol (FERAHEME) 510 mg in sodium chloride 0.9 % 100 mL IVPB (not administered)  aspirin chewable tablet 324 mg (324 mg Oral Given 02/15/16 2120)  ondansetron (ZOFRAN) 4 MG/2ML injection (4 mg  Given 02/15/16  2221)  iopamidol (ISOVUE-370) 76 % injection 100 mL (100 mLs Intravenous Contrast Given 02/15/16 2247)  nitroGLYCERIN 50 mg in dextrose 5 % 250 mL (0.2 mg/mL) infusion (50 mcg/min Intravenous Rate/Dose Change 02/16/16 0322)  heparin bolus via infusion 4,000 Units (4,000 Units Intravenous Bolus from Bag 02/16/16 0317)  0.9 %  sodium chloride infusion ( Intravenous New Bag/Given 02/16/16 1030)  acetaminophen (TYLENOL) tablet 650 mg (650 mg Oral Given 02/16/16 1231)  diphenhydrAMINE (BENADRYL) injection 25 mg (25 mg Intravenous Given 02/16/16 1232)     Initial Impression / Assessment and Plan / ED Course  I have reviewed the triage vital signs and the nursing notes.  Pertinent labs & imaging results that were available during my care of the patient were reviewed by me and considered in my medical decision making (see chart for details).  Clinical Course    Patient presents with concern for hypertensive emergency versus NSTEMI after she has been having worsening chest pain radiating to her upper back and left arm, no reported exacerbating factors. She has a strong family history of early heart disease. She was initially called as a code STEMI due to her EKG printout though on review of multiple serial EKGs, along with discussion with cardiology, she has no actual ST elevations. Does have borderline lateral T wave depressions which are stable through multiple EKGs here. Pressures elevated to the 200s/110s with no known history of hypertension. She was given 324 mg of chewable aspirin. Initial EKG was 0.29. Discussed this with cardiology who felt that this was more consistent with hypertensive emergency  and would hold anticoagulation now. CTA was performed to evaluate for dissection given pain radiating to her back and her new hypertension, this was negative for dissection or aneurysm. She was started on a nitro drip for blood pressure control. Critical care team was consulted given need for vasoactive drip, recommended stepdown care. She will be admitted to hospitalist service with cardiology consulting on the stepdown unit.  Final Clinical Impressions(s) / ED Diagnoses   Final diagnoses:  NSTEMI (non-ST elevated myocardial infarction) [facility])  Hypertensive emergency    New Prescriptions Current Discharge Medication List       Harlin Heys, MD 02/16/16 [place], MD 02/16/16 1739

## 2016-02-15 NOTE — ED Notes (Signed)
Positive troponin reported to dr Dulcy Fanny

## 2016-02-15 NOTE — Progress Notes (Signed)
Responded to code stemi, canceled after Chaplain arrived.  No assessment was made.  [employer] [phone]

## 2016-02-15 NOTE — ED Triage Notes (Signed)
The pt is c/o chest pain all day getting worse with sob and nausea  No past medical history

## 2016-02-16 ENCOUNTER — Encounter (HOSPITAL_COMMUNITY)
Admission: EM | Disposition: A | Discharge status: Home / Self Care | Payer: Self-pay | Source: Home / Self Care | Attending: Internal Medicine

## 2016-02-16 ENCOUNTER — Encounter (HOSPITAL_COMMUNITY): Payer: Self-pay | Admitting: Internal Medicine

## 2016-02-16 DIAGNOSIS — Z8249 Family history of ischemic heart disease and other diseases of the circulatory system: Secondary | ICD-10-CM | POA: Diagnosis not present

## 2016-02-16 DIAGNOSIS — Z87442 Personal history of urinary calculi: Secondary | ICD-10-CM | POA: Diagnosis not present

## 2016-02-16 DIAGNOSIS — R748 Abnormal levels of other serum enzymes: Secondary | ICD-10-CM

## 2016-02-16 DIAGNOSIS — K7689 Other specified diseases of liver: Secondary | ICD-10-CM | POA: Diagnosis present

## 2016-02-16 DIAGNOSIS — I214 Non-ST elevation (NSTEMI) myocardial infarction: Secondary | ICD-10-CM

## 2016-02-16 DIAGNOSIS — I10 Essential (primary) hypertension: Secondary | ICD-10-CM | POA: Diagnosis present

## 2016-02-16 DIAGNOSIS — D500 Iron deficiency anemia secondary to blood loss (chronic): Secondary | ICD-10-CM | POA: Diagnosis present

## 2016-02-16 DIAGNOSIS — R0902 Hypoxemia: Secondary | ICD-10-CM | POA: Diagnosis present

## 2016-02-16 DIAGNOSIS — I161 Hypertensive emergency: Principal | ICD-10-CM

## 2016-02-16 DIAGNOSIS — I248 Other forms of acute ischemic heart disease: Secondary | ICD-10-CM | POA: Diagnosis present

## 2016-02-16 DIAGNOSIS — I249 Acute ischemic heart disease, unspecified: Secondary | ICD-10-CM | POA: Diagnosis not present

## 2016-02-16 DIAGNOSIS — R079 Chest pain, unspecified: Secondary | ICD-10-CM

## 2016-02-16 DIAGNOSIS — I160 Hypertensive urgency: Secondary | ICD-10-CM

## 2016-02-16 DIAGNOSIS — N920 Excessive and frequent menstruation with regular cycle: Secondary | ICD-10-CM | POA: Diagnosis present

## 2016-02-16 HISTORY — PX: CARDIAC CATHETERIZATION: SHX172

## 2016-02-16 LAB — CBC
HCT: 27.9 % — ABNORMAL LOW (ref 36.0–46.0)
HCT: 30.4 % — ABNORMAL LOW (ref 36.0–46.0)
Hemoglobin: 7.2 g/dL — ABNORMAL LOW (ref 12.0–15.0)
Hemoglobin: 8.2 g/dL — ABNORMAL LOW (ref 12.0–15.0)
MCH: 15.5 pg — ABNORMAL LOW (ref 26.0–34.0)
MCH: 17 pg — ABNORMAL LOW (ref 26.0–34.0)
MCHC: 25.8 g/dL — ABNORMAL LOW (ref 30.0–36.0)
MCHC: 27 g/dL — ABNORMAL LOW (ref 30.0–36.0)
MCV: 60.1 fL — ABNORMAL LOW (ref 78.0–100.0)
MCV: 63.1 fL — ABNORMAL LOW (ref 78.0–100.0)
Platelets: 389 10*3/uL (ref 150–400)
Platelets: 301 10*3/uL (ref 150–400)
RBC: 4.64 MIL/uL (ref 3.87–5.11)
RBC: 4.82 MIL/uL (ref 3.87–5.11)
RDW: 19.1 % — ABNORMAL HIGH (ref 11.5–15.5)
RDW: 21.4 % — ABNORMAL HIGH (ref 11.5–15.5)
WBC: 9.4 10*3/uL (ref 4.0–10.5)
WBC: 8.9 10*3/uL (ref 4.0–10.5)

## 2016-02-16 LAB — LIPID PANEL
Cholesterol: 206 mg/dL — ABNORMAL HIGH (ref 0–200)
HDL: 39 mg/dL — ABNORMAL LOW (ref >40)
LDL Cholesterol: 152 mg/dL — ABNORMAL HIGH (ref 0–99)
Total CHOL/HDL Ratio: 5.3 RATIO
Triglycerides: 75 mg/dL (ref <150)
VLDL: 15 mg/dL (ref 0–40)

## 2016-02-16 LAB — IRON AND TIBC
Iron: 13 ug/dL — ABNORMAL LOW (ref 28–170)
Saturation Ratios: 3 % — ABNORMAL LOW (ref 10.4–31.8)
TIBC: 441 ug/dL (ref 250–450)
UIBC: 428 ug/dL

## 2016-02-16 LAB — BASIC METABOLIC PANEL
Anion gap: 7 (ref 5–15)
BUN: 7 mg/dL (ref 6–20)
CO2: 24 mmol/L (ref 22–32)
Calcium: 8.6 mg/dL — ABNORMAL LOW (ref 8.9–10.3)
Chloride: 104 mmol/L (ref 101–111)
Creatinine, Ser: 0.7 mg/dL (ref 0.44–1.00)
GFR calc Af Amer: >60 mL/min (ref >60)
GFR calc non Af Amer: >60 mL/min (ref >60)
Glucose, Bld: 140 mg/dL — ABNORMAL HIGH (ref 65–99)
Potassium: 3.6 mmol/L (ref 3.5–5.1)
Sodium: 135 mmol/L (ref 135–145)

## 2016-02-16 LAB — TROPONIN I
Troponin I: 0.68 ng/mL (ref <0.03)
Troponin I: 2.11 ng/mL (ref <0.03)
Troponin I: 3.43 ng/mL (ref <0.03)
Troponin I: 3.03 ng/mL (ref <0.03)

## 2016-02-16 LAB — FERRITIN: Ferritin: 4 ng/mL — ABNORMAL LOW (ref 11–307)

## 2016-02-16 LAB — MRSA PCR SCREENING: MRSA by PCR: NEGATIVE

## 2016-02-16 LAB — RAPID URINE DRUG SCREEN, HOSP PERFORMED
Amphetamines: NOT DETECTED
Barbiturates: NOT DETECTED
Benzodiazepines: POSITIVE — AB
Cocaine: NOT DETECTED
Opiates: NOT DETECTED
Tetrahydrocannabinol: NOT DETECTED

## 2016-02-16 LAB — FOLATE: Folate: 13.4 ng/mL (ref >5.9)

## 2016-02-16 LAB — RETICULOCYTES
RBC.: 4.62 MIL/uL (ref 3.87–5.11)
Retic Count, Absolute: 55.4 10*3/uL (ref 19.0–186.0)
Retic Ct Pct: 1.2 % (ref 0.4–3.1)

## 2016-02-16 LAB — POC URINE PREG, ED: Preg Test, Ur: NEGATIVE

## 2016-02-16 LAB — PREPARE RBC (CROSSMATCH)

## 2016-02-16 LAB — VITAMIN B12: Vitamin B-12: 637 pg/mL (ref 180–914)

## 2016-02-16 LAB — ABO/RH: ABO/RH(D): O POS

## 2016-02-16 SURGERY — LEFT HEART CATH AND CORONARY ANGIOGRAPHY

## 2016-02-16 MED ORDER — ACETAMINOPHEN 325 MG PO TABS
650.0000 mg | ORAL_TABLET | Freq: Once | ORAL | Status: AC
  Administered 2016-02-16: 650 mg via ORAL
  Filled 2016-02-16: qty 2

## 2016-02-16 MED ORDER — MIDAZOLAM HCL 2 MG/2ML IJ SOLN
INTRAMUSCULAR | Status: DC | PRN
  Administered 2016-02-16: 1 mg via INTRAVENOUS

## 2016-02-16 MED ORDER — FUROSEMIDE 10 MG/ML IJ SOLN: 20.0000 mg | Freq: Once | INTRAMUSCULAR | Status: DC

## 2016-02-16 MED ORDER — SODIUM CHLORIDE 0.9 % WEIGHT BASED INFUSION: 3.0000 mL/kg/h | INTRAVENOUS | Status: DC

## 2016-02-16 MED ORDER — FENTANYL CITRATE (PF) 100 MCG/2ML IJ SOLN
INTRAMUSCULAR | Status: AC
  Filled 2016-02-16: qty 2

## 2016-02-16 MED ORDER — ATORVASTATIN CALCIUM 80 MG PO TABS
80.0000 mg | ORAL_TABLET | Freq: Every day | ORAL | Status: DC
  Administered 2016-02-16 – 2016-02-17 (×2): 80 mg via ORAL
  Filled 2016-02-16 (×2): qty 1

## 2016-02-16 MED ORDER — SODIUM CHLORIDE 0.9 % WEIGHT BASED INFUSION: 1.0000 mL/kg/h | INTRAVENOUS | Status: DC

## 2016-02-16 MED ORDER — ASPIRIN EC 81 MG PO TBEC
81.0000 mg | DELAYED_RELEASE_TABLET | Freq: Every day | ORAL | Status: DC
  Administered 2016-02-17 – 2016-02-18 (×2): 81 mg via ORAL
  Filled 2016-02-16 (×2): qty 1

## 2016-02-16 MED ORDER — FENTANYL CITRATE (PF) 100 MCG/2ML IJ SOLN
INTRAMUSCULAR | Status: DC | PRN
  Administered 2016-02-16: 50 ug via INTRAVENOUS

## 2016-02-16 MED ORDER — VERAPAMIL HCL 2.5 MG/ML IV SOLN
INTRAVENOUS | Status: DC | PRN
  Administered 2016-02-16: 10 mL via INTRA_ARTERIAL

## 2016-02-16 MED ORDER — ENOXAPARIN SODIUM 40 MG/0.4ML ~~LOC~~ SOLN
40.0000 mg | SUBCUTANEOUS | Status: DC
  Administered 2016-02-16 – 2016-02-17 (×2): 40 mg via SUBCUTANEOUS
  Filled 2016-02-16 (×2): qty 0.4

## 2016-02-16 MED ORDER — NITROGLYCERIN IN D5W 200-5 MCG/ML-% IV SOLN: 0.0000 ug/min | INTRAVENOUS | Status: DC

## 2016-02-16 MED ORDER — SODIUM CHLORIDE 0.9 % IV SOLN
INTRAVENOUS | Status: DC | PRN
  Administered 2016-02-16: 315 mL/h via INTRAVENOUS

## 2016-02-16 MED ORDER — SODIUM CHLORIDE 0.9 % IV SOLN: INTRAVENOUS | Status: AC

## 2016-02-16 MED ORDER — SODIUM CHLORIDE 0.9% FLUSH: 3.0000 mL | INTRAVENOUS | Status: DC | PRN

## 2016-02-16 MED ORDER — MORPHINE SULFATE (PF) 4 MG/ML IV SOLN
2.0000 mg | INTRAVENOUS | Status: DC | PRN
  Administered 2016-02-16 (×3): 2 mg via INTRAVENOUS
  Filled 2016-02-16 (×4): qty 1

## 2016-02-16 MED ORDER — HEPARIN (PORCINE) IN NACL 2-0.9 UNIT/ML-% IJ SOLN
INTRAMUSCULAR | Status: AC
  Filled 2016-02-16: qty 500

## 2016-02-16 MED ORDER — LISINOPRIL 10 MG PO TABS
10.0000 mg | ORAL_TABLET | Freq: Every day | ORAL | Status: DC
  Administered 2016-02-16 – 2016-02-17 (×2): 10 mg via ORAL
  Filled 2016-02-16 (×2): qty 1

## 2016-02-16 MED ORDER — ACETAMINOPHEN 325 MG PO TABS
650.0000 mg | ORAL_TABLET | ORAL | Status: DC | PRN
  Administered 2016-02-17 – 2016-02-18 (×3): 650 mg via ORAL
  Filled 2016-02-16 (×3): qty 2

## 2016-02-16 MED ORDER — ASPIRIN 81 MG PO CHEW
CHEWABLE_TABLET | ORAL | Status: DC | PRN
  Administered 2016-02-16: 81 mg via ORAL

## 2016-02-16 MED ORDER — MORPHINE SULFATE (PF) 4 MG/ML IV SOLN
INTRAVENOUS | Status: AC
  Filled 2016-02-16: qty 1

## 2016-02-16 MED ORDER — ASPIRIN 81 MG PO CHEW
CHEWABLE_TABLET | ORAL | Status: AC
  Filled 2016-02-16: qty 1

## 2016-02-16 MED ORDER — ASPIRIN 300 MG RE SUPP: 300.0000 mg | RECTAL | Status: AC

## 2016-02-16 MED ORDER — ATORVASTATIN CALCIUM 10 MG PO TABS: 10.0000 mg | ORAL_TABLET | Freq: Every day | ORAL | Status: DC

## 2016-02-16 MED ORDER — SODIUM CHLORIDE 0.9% FLUSH: 3.0000 mL | Freq: Two times a day (BID) | INTRAVENOUS | Status: DC

## 2016-02-16 MED ORDER — IOPAMIDOL (ISOVUE-370) INJECTION 76%
INTRAVENOUS | Status: DC | PRN
  Administered 2016-02-16: 135 mL via INTRA_ARTERIAL

## 2016-02-16 MED ORDER — LIDOCAINE HCL (PF) 1 % IJ SOLN
INTRAMUSCULAR | Status: AC
  Filled 2016-02-16: qty 30

## 2016-02-16 MED ORDER — MIDAZOLAM HCL 2 MG/2ML IJ SOLN
INTRAMUSCULAR | Status: AC
  Filled 2016-02-16: qty 2

## 2016-02-16 MED ORDER — SODIUM CHLORIDE 0.9 % IV SOLN: 250.0000 mL | INTRAVENOUS | Status: DC | PRN

## 2016-02-16 MED ORDER — HEPARIN (PORCINE) IN NACL 2-0.9 UNIT/ML-% IJ SOLN
INTRAMUSCULAR | Status: AC
  Filled 2016-02-16: qty 1000

## 2016-02-16 MED ORDER — VERAPAMIL HCL 2.5 MG/ML IV SOLN
INTRAVENOUS | Status: AC
  Filled 2016-02-16: qty 2

## 2016-02-16 MED ORDER — HEPARIN SODIUM (PORCINE) 1000 UNIT/ML IJ SOLN
INTRAMUSCULAR | Status: AC
  Filled 2016-02-16: qty 1

## 2016-02-16 MED ORDER — SODIUM CHLORIDE 0.9 % IV SOLN
Freq: Once | INTRAVENOUS | Status: AC
  Administered 2016-02-16: 11:00:00 via INTRAVENOUS

## 2016-02-16 MED ORDER — ASPIRIN 81 MG PO CHEW: 324.0000 mg | CHEWABLE_TABLET | ORAL | Status: AC

## 2016-02-16 MED ORDER — HEPARIN SODIUM (PORCINE) 1000 UNIT/ML IJ SOLN
INTRAMUSCULAR | Status: DC | PRN
  Administered 2016-02-16: 5000 [IU] via INTRAVENOUS

## 2016-02-16 MED ORDER — IOPAMIDOL (ISOVUE-370) INJECTION 76%
INTRAVENOUS | Status: AC
  Filled 2016-02-16: qty 100

## 2016-02-16 MED ORDER — ONDANSETRON HCL 4 MG/2ML IJ SOLN
INTRAMUSCULAR | Status: DC | PRN
  Administered 2016-02-16: 4 mg via INTRAVENOUS

## 2016-02-16 MED ORDER — METOPROLOL TARTRATE 12.5 MG HALF TABLET: 12.5000 mg | ORAL_TABLET | Freq: Two times a day (BID) | ORAL | Status: DC

## 2016-02-16 MED ORDER — SODIUM CHLORIDE 0.9 % IV SOLN
510.0000 mg | INTRAVENOUS | Status: DC
  Administered 2016-02-16: 510 mg via INTRAVENOUS
  Filled 2016-02-16 (×2): qty 17

## 2016-02-16 MED ORDER — HEPARIN (PORCINE) IN NACL 100-0.45 UNIT/ML-% IJ SOLN
950.0000 [IU]/h | INTRAMUSCULAR | Status: DC
  Administered 2016-02-16: 950 [IU]/h via INTRAVENOUS
  Filled 2016-02-16: qty 250

## 2016-02-16 MED ORDER — HEPARIN BOLUS VIA INFUSION
4000.0000 [IU] | Freq: Once | INTRAVENOUS | Status: AC
  Administered 2016-02-16: 4000 [IU] via INTRAVENOUS
  Filled 2016-02-16: qty 4000

## 2016-02-16 MED ORDER — SODIUM CHLORIDE 0.9% FLUSH
3.0000 mL | Freq: Two times a day (BID) | INTRAVENOUS | Status: DC
  Administered 2016-02-16 – 2016-02-18 (×5): 3 mL via INTRAVENOUS

## 2016-02-16 MED ORDER — DIPHENHYDRAMINE HCL 50 MG/ML IJ SOLN
25.0000 mg | Freq: Once | INTRAMUSCULAR | Status: AC
  Administered 2016-02-16: 25 mg via INTRAVENOUS
  Filled 2016-02-16: qty 1

## 2016-02-16 MED ORDER — LIDOCAINE HCL (PF) 1 % IJ SOLN
INTRAMUSCULAR | Status: DC | PRN
Start: 2016-02-16 — End: 2016-02-16
  Administered 2016-02-16: 4 mL

## 2016-02-16 MED ORDER — ONDANSETRON HCL 4 MG/2ML IJ SOLN: 4.0000 mg | Freq: Four times a day (QID) | INTRAMUSCULAR | Status: DC | PRN

## 2016-02-16 MED ORDER — CARVEDILOL 3.125 MG PO TABS
6.2500 mg | ORAL_TABLET | Freq: Two times a day (BID) | ORAL | Status: DC
  Administered 2016-02-16: 6.25 mg via ORAL
  Filled 2016-02-16 (×2): qty 2

## 2016-02-16 MED ORDER — HEPARIN (PORCINE) IN NACL 2-0.9 UNIT/ML-% IJ SOLN
INTRAMUSCULAR | Status: DC | PRN
  Administered 2016-02-16: 1000 mL

## 2016-02-16 MED ORDER — ONDANSETRON HCL 4 MG/2ML IJ SOLN
INTRAMUSCULAR | Status: AC
  Filled 2016-02-16: qty 2

## 2016-02-16 MED ORDER — ASPIRIN 81 MG PO CHEW
81.0000 mg | CHEWABLE_TABLET | ORAL | Status: DC
Start: 2016-02-17 — End: 2016-02-16

## 2016-02-16 SURGICAL SUPPLY — 9 items
CATH 5FR JL3.5 JR4 ANG PIG MP (CATHETERS) ×1 IMPLANT
DEVICE RAD COMP TR BAND LRG (VASCULAR PRODUCTS) ×1 IMPLANT
GLIDESHEATH SLEND SS 6F .021 (SHEATH) ×1 IMPLANT
KIT HEART LEFT (KITS) ×2 IMPLANT
PACK CARDIAC CATHETERIZATION (CUSTOM PROCEDURE TRAY) ×2 IMPLANT
SYR MEDRAD MARK V 150ML (SYRINGE) ×2 IMPLANT
TRANSDUCER W/STOPCOCK (MISCELLANEOUS) ×2 IMPLANT
TUBING CIL FLEX 10 FLL-RA (TUBING) ×2 IMPLANT
WIRE SAFE-T 1.5MM-J .035X260CM (WIRE) ×1 IMPLANT

## 2016-02-16 NOTE — H&P (Signed)
History and Physical    Jessica Cruz [id number] DOB: October 02, 1965 DOA: 02/15/2016   PCP: No PCP Per Patient Chief Complaint:  Chief Complaint  Patient presents with  . Chest Pain  . Code STEMI    HPI: Jessica Cruz is a 50 y.o. female with medical history significant of cerebral aneurysm repair.  Patient presents to the ED with c/o chest pain.  Symptoms onset yesterday.  Pain was severe, located in central chest with radiation to back.  Pain worse with deep breaths or any activity.  Came in to the ED, was hypertensive with BP 200/132.  Ct chest showed no dissection.  Cardiology was consulted and they felt initially that this was more likely hypertensive urgency and recommended NTG gtt.  Patient was placed on NTG gtt and pain initially improved some what as did her BP; however, despite remaining on NTG gtt and BP now 160/100, patients CP has become somewhat severe again (patient states 7-8 in intensity).  Initial troponin was 0.29, a repeat troponin has now come back at 0.68.  Review of Systems: As per HPI otherwise 10 point review of systems negative.    History reviewed. No pertinent past medical history.  Past Surgical History:  Procedure Laterality Date  . CEREBRAL ANEURYSM REPAIR       reports that she has never smoked. She has never used smokeless tobacco. She reports that she drinks alcohol. She reports that she does not use drugs.  Allergies  Allergen Reactions  . Pineapple Itching and Swelling    TONGUE AND THROAT AFFECTED  . Shellfish Allergy Itching and Swelling    THROAT AND TONGUE ARE AFFECTED    Family History  Problem Relation Age of Onset  . Heart attack Father 73  . Hypertension Brother       Prior to Admission medications   Medication Sig Start Date End Date Taking? Authorizing Provider  acetaminophen (TYLENOL) 325 MG tablet Take 325-650 mg by mouth every 6 (six) hours as needed for headache.   Yes Historical Provider, MD    diphenhydramine-acetaminophen (TYLENOL PM EXTRA STRENGTH) 25-500 MG TABS tablet Take 1 tablet by mouth at bedtime as needed (for sleep).   Yes Historical Provider, MD  ibuprofen (ADVIL,MOTRIN) 600 MG tablet Take 1 tablet (600 mg total) by mouth every 6 (six) hours as needed. Patient not taking: Reported on 02/15/2016 11/12/13   Junius Creamer, NP    Physical Exam: Vitals:   02/16/16 0115 02/16/16 0130 02/16/16 0145 02/16/16 0200  BP: 177/97 174/93 (!) 190/115 163/93  Pulse: 91 73 85 92  Resp: 12 17 18 17   Temp:      TempSrc:      SpO2: 100% 100% 100% 100%  Weight:      Height:          Constitutional: NAD, calm, comfortable Eyes: PERRL, lids and conjunctivae normal ENMT: Mucous membranes are moist. Posterior pharynx clear of any exudate or lesions.Normal dentition.  Neck: normal, supple, no masses, no thyromegaly Respiratory: clear to auscultation bilaterally, no wheezing, no crackles. Normal respiratory effort. No accessory muscle use.  Cardiovascular: Regular rate and rhythm, no murmurs / rubs / gallops. No extremity edema. 2+ pedal pulses. No carotid bruits.  Abdomen: no tenderness, no masses palpated. No hepatosplenomegaly. Bowel sounds positive.  Musculoskeletal: no clubbing / cyanosis. No joint deformity upper and lower extremities. Good ROM, no contractures. Normal muscle tone.  Skin: no rashes, lesions, ulcers. No induration Neurologic: CN 2-12 grossly intact. Sensation intact, DTR normal.  Strength 5/5 in all 4.  Psychiatric: Normal judgment and insight. Alert and oriented x 3. Normal mood.    Labs on Admission: I have personally reviewed following labs and imaging studies  CBC:  Recent Labs Lab 02/15/16 2111  WBC 6.8  NEUTROABS 3.9  HGB 7.2*  HCT 27.3*  MCV 60.0*  PLT 123XX123   Basic Metabolic Panel:  Recent Labs Lab 02/15/16 2111  NA 137  K 3.1*  CL 108  CO2 23  GLUCOSE 138*  BUN 9  CREATININE 0.74  CALCIUM 8.5*   GFR: Estimated Creatinine Clearance:  98.2 mL/min (by C-G formula based on SCr of 0.74 mg/dL). Liver Function Tests:  Recent Labs Lab 02/15/16 2111  AST 21  ALT 16  ALKPHOS 58  BILITOT 0.6  PROT 7.3  ALBUMIN 3.4*   No results for input(s): LIPASE, AMYLASE in the last 168 hours. No results for input(s): AMMONIA in the last 168 hours. Coagulation Profile:  Recent Labs Lab 02/15/16 2111  INR 1.13   Cardiac Enzymes:  Recent Labs Lab 02/15/16 2111 02/16/16 0144  TROPONINI 0.29* 0.68*   BNP (last 3 results) No results for input(s): PROBNP in the last 8760 hours. HbA1C: No results for input(s): HGBA1C in the last 72 hours. CBG: No results for input(s): GLUCAP in the last 168 hours. Lipid Profile:  Recent Labs  02/15/16 2111  CHOL 196  HDL 33*  LDLCALC 131*  TRIG 158*  CHOLHDL 5.9   Thyroid Function Tests: No results for input(s): TSH, T4TOTAL, FREET4, T3FREE, THYROIDAB in the last 72 hours. Anemia Panel: No results for input(s): VITAMINB12, FOLATE, FERRITIN, TIBC, IRON, RETICCTPCT in the last 72 hours. Urine analysis:    Component Value Date/Time   COLORURINE YELLOW 07/29/2013 0507   APPEARANCEUR CLOUDY (A) 07/29/2013 0507   LABSPEC 1.034 (H) 07/29/2013 0507   PHURINE 5.5 07/29/2013 0507   GLUCOSEU NEGATIVE 07/29/2013 0507   HGBUR LARGE (A) 07/29/2013 0507   BILIRUBINUR NEGATIVE 07/29/2013 0507   KETONESUR 15 (A) 07/29/2013 0507   PROTEINUR 30 (A) 07/29/2013 0507   UROBILINOGEN 1.0 07/29/2013 0507   NITRITE NEGATIVE 07/29/2013 0507   LEUKOCYTESUR NEGATIVE 07/29/2013 0507   Sepsis Labs: @LABRCNTIP (procalcitonin:4,lacticidven:4) )No results found for this or any previous visit (from the past 240 hour(s)).   Radiological Exams on Admission: Dg Chest Portable 1 View  Result Date: 02/15/2016 CLINICAL DATA:  Worsening chest pain and dyspnea for 1 day. EXAM: PORTABLE CHEST 1 VIEW COMPARISON:  01/12/2013 FINDINGS: A single AP portable view of the chest demonstrates no focal airspace  consolidation or alveolar edema. The lungs are grossly clear. There is no large effusion or pneumothorax. Cardiac and mediastinal contours appear unremarkable. IMPRESSION: No active disease. Electronically Signed   By: Andreas Newport M.D.   On: 02/15/2016 21:38   Ct Angio Chest/abd/pel For Dissection W And/or Wo Contrast  Result Date: 02/15/2016 CLINICAL DATA:  50 y/o F; chest pain and left arm pain to the back and vomiting since this morning. EXAM: CT ANGIOGRAPHY CHEST, ABDOMEN AND PELVIS TECHNIQUE: Multidetector CT imaging through the chest, abdomen and pelvis was performed using the standard protocol during bolus administration of intravenous contrast. Multiplanar reconstructed images and MIPs were obtained and reviewed to evaluate the vascular anatomy. CONTRAST:  100 cc Isovue 370 COMPARISON:  07/29/2013 CT of abdomen and pelvis. FINDINGS: CTA CHEST FINDINGS Cardiovascular: Preferential opacification of the thoracic aorta. No evidence of thoracic aortic aneurysm or dissection. Normal heart size. No pericardial effusion. Normal caliber main pulmonary artery.  No central or lobar pulmonary embolus. Mediastinum/Nodes: No enlarged mediastinal, hilar, or axillary lymph nodes. Thyroid gland, trachea, and esophagus demonstrate no significant findings. Lungs/Pleura: Lungs are clear. No pleural effusion or pneumothorax. Musculoskeletal: No chest wall abnormality. No acute or significant osseous findings. Review of the MIP images confirms the above findings. CTA ABDOMEN AND PELVIS FINDINGS VASCULAR Aorta: Normal caliber aorta without aneurysm, dissection, vasculitis or significant stenosis. Celiac: Patent without evidence of aneurysm, dissection, vasculitis or significant stenosis. SMA: Patent without evidence of aneurysm, dissection, vasculitis or significant stenosis. Renals: Both renal arteries are patent without evidence of aneurysm, dissection, vasculitis, fibromuscular dysplasia or significant stenosis. IMA:  Patent without evidence of aneurysm, dissection, vasculitis or significant stenosis. Inflow: Patent without evidence of aneurysm, dissection, vasculitis or significant stenosis. Veins: No obvious venous abnormality within the limitations of this arterial phase study. Review of the MIP images confirms the above findings. NON-VASCULAR Hepatobiliary: 6 mm hyper attenuating focus in segment 2 (series 7, image 66). Otherwise no focal liver abnormality is seen. No gallstones, gallbladder wall thickening, or biliary dilatation. Pancreas: Unremarkable. No pancreatic ductal dilatation or surrounding inflammatory changes. Spleen: Normal in size without focal abnormality. Several small splenules anterior to the spleen the largest measuring 17 mm per Adrenals/Urinary Tract: Adrenal glands are unremarkable. Kidneys are normal, without renal calculi, focal lesion, or hydronephrosis. Bladder is unremarkable. Stomach/Bowel: Stomach is within normal limits. Appendix appears normal. No evidence of bowel wall thickening, distention, or inflammatory changes. Lymphatic: No significant vascular findings are present. No enlarged abdominal or pelvic lymph nodes. Reproductive: Uterus and bilateral adnexa are unremarkable. Other: No abdominal wall hernia or abnormality. No abdominopelvic ascites. Musculoskeletal: No acute or significant osseous findings. Right fourth lateral rib a bone island. Mild thoracic spine endplate degenerative changes of lower lumbar facet arthrosis. Review of the MIP images confirms the above findings. IMPRESSION: 1. No acute finding as explanation for pain. 2. Normal aorta without evidence for aneurysm, dissection, vasculitis, or significant stenosis. 3. 6 mm hyperattenuating focus in liver segment 2, probably benign. In a low risk patient no additional follow-up is necessary. In a high risk patient follow-up with MRI in 3-6 months is recommended. This recommendation follows ACR consensus guidelines: Management of  Incidental Liver Lesions on CT: A White Paper of the ACR Incidental Findings Committee. J Am Coll Radiol 2017; article currently in press. 4. Otherwise unremarkable CTA of chest abdomen and pelvis. Electronically Signed   By: Kristine Garbe M.D.   On: 02/15/2016 23:55    EKG: Independently reviewed.  Assessment/Plan Principal Problem:   ACS (acute coronary syndrome) (HCC) Active Problems:   Hypertension    1. ACS - Troponin elevating and CP persisting / worsening despite NTG gtt and BPs 160/100.  Picture is worrisome for NSTEMI.  Have re-paged cardiology to update. 1. In addition to NTG gtt, lisinopril, ASA: 2. Lipitor 10 3. Lipid pnl 4. Heparin gtt 5. Tele monitor in SDU 6. Morphine PRN pain 7. Continue to up-titrate nitro gtt 8. Serial trops ordered 9. Checking A1C 10. Keeping NPO, as I am suspicious she may end up getting heart cath tomorrow.   DVT prophylaxis: Heparin gtt Code Status: Full Family Communication: Family at bedside Consults called: Cardiology Admission status: Admit to inpatient   Etta Quill DO [facility] Pager [phone] from 7PM-7AM  If 7AM-7PM, please contact the day physician for the patient [email or web address] Password [email or web address]  02/16/2016, 2:48 AM

## 2016-02-16 NOTE — Care Management Note (Signed)
Case Management Note  Patient Details  Name: Jessica Cruz MRN: [id number] Date of Birth: 18-May-1965  Subjective/Objective:     NSTEMI, HTN                Action/Plan: Discharge Planning: NCM spoke to pt and states her new insurance coverage begins today. States insurance coverage is with her husband's job. Pt will have husband bring a card. Explained to pt that she can call the toll free number on her new insurance card to inquire about provider list. Explained she will need to follow up with PCP. Pt states she works full-time.     Expected Discharge Date:                  Expected Discharge Plan:  Home/Self Care  In-House Referral:  NA  Discharge planning Services  CM Consult  Post Acute Care Choice:  NA Choice offered to:  NA  DME Arranged:  N/A DME Agency:  NA  HH Arranged:  NA HH Agency:  NA  Status of Service:  Completed, signed off  If discussed at [place] of Stay Meetings, dates discussed:    Additional Comments:  Erenest Rasher, RN 02/16/2016, 4:43 PM

## 2016-02-16 NOTE — Progress Notes (Signed)
MEDICATION RELATED CONSULT NOTE - INITIAL   Pharmacy Consult for IV iron Indication: anemia  Allergies  Allergen Reactions  . Pineapple Itching and Swelling    TONGUE AND THROAT AFFECTED  . Shellfish Allergy Itching and Swelling    THROAT AND TONGUE ARE AFFECTED    Patient Measurements: Height: 5\' 5"  (165.1 cm) Weight: 231 lb 11.3 oz (105.1 kg) IBW/kg (Calculated) : 57   Labs:  Recent Labs  02/15/16 2111 02/16/16 0543  WBC 6.8 9.4  HGB 7.2* 7.2*  HCT 27.3* 27.9*  PLT 297 389  APTT 28  --   CREATININE 0.74 0.70  ALBUMIN 3.4*  --   PROT 7.3  --   AST 21  --   ALT 16  --   ALKPHOS 58  --   BILITOT 0.6  --    Estimated Creatinine Clearance: 103.5 mL/min (by C-G formula based on SCr of 0.7 mg/dL).  Lab Results  Component Value Date   IRON 13 (L) 02/16/2016   TIBC 441 02/16/2016   FERRITIN 4 (L) 02/16/2016    Medical History: History reviewed. No pertinent past medical history.  Medications:  Scheduled:  . aspirin  324 mg Oral NOW   Or  . aspirin  300 mg Rectal NOW  . [START ON 02/17/2016] aspirin EC  81 mg Oral Daily  . atorvastatin  80 mg Oral q1800  . carvedilol  6.25 mg Oral BID WC  . enoxaparin (LOVENOX) injection  40 mg Subcutaneous Q24H  . lisinopril  10 mg Oral Daily  . morphine      . sodium chloride flush  3 mL Intravenous Q12H     Plan:  Will give Feraheme 510 mg IV x 2 doses, given 3 days apart.  Uvaldo Rising, BCPS  Clinical Pharmacist Pager [phone]  02/16/2016 1:46 PM

## 2016-02-16 NOTE — ED Notes (Signed)
Attempted report 

## 2016-02-16 NOTE — Progress Notes (Signed)
PROGRESS NOTE    Jessica Cruz  [id number] DOB: 02/07/1966 DOA: 02/15/2016 PCP: No PCP Per Patient    Brief Narrative:  Jessica Cruz is a 50 yo female with a medical history significant for cerebral aneurysm repair, Fe def anemia from fibroids, presented to the hospital on 10/31 with chest pain and hypertensive emergency. She was admitted and started on nitroglycerin infusion, her troponins gradually increased, cardiology was consulted, she underwent a cardiac catheterization that showed normal coronaries.    Assessment & Plan:  NSTEMI: Troponin markedly elevated-underwent LHC-showed normal coronaries. Chest CT showed no dissection. Since continued to have chest pain earlier this am-we will go ahead and transfuse 1 unit of PRBC. Continue aspirin, statin and Coreg. Attempt to Wean off nitroglycerin drip.Await Echo  Hypertensive emergency: Started on a nitroglycerin infusion on admission, has not been started on lisinopril and Coreg-plans are to slowly titrate off nitroglycerin infusion. We will follow and adjust medications accordingly.  Microcytic anemia due to Fe Deficiency: Likely from menorrhagia-has known history of fibroid uterus. Since continues to have chest pain, will go it and transfuse 1 unit of PRBC. Will dose IV Iron per pharmacy, plan to start oral Fe supplementation on discharge. Patient to follow up with her primary GYN for definite management of fibroid uterus  H/O intracranial aneurysm rupture: Clipped at [facility] 2007; no records available.Needs BP optimization  Liver nodule: fu per primary care   DVT prophylaxis: Lovenox Code Status: Full Family Communication: Husband at bedside.  Disposition Plan: Home in next 1-2 days   Consultants:   Cardiology  Procedures:   Cardiac catheterization 11/1 shows left ventricular ejection fraction is 55-65% by visual estimate.Very tortuous vessels suggestive of prolonged hypertension. Normal LV systolic function and LV  end-diastolic pressure.  Echo 11/1 pending  Antimicrobials:   None    Subjective: Patient states CP was a 20/10 on admission, 5/10 before medication this morning-but during my eval-she was chest pain free.    Family history is significant for heart disease in mom and dad. Dad died of MI at age 20.  Objective: Vitals:   02/16/16 0927 02/16/16 0941 02/16/16 0944 02/16/16 0956  BP: 127/83 (!) 142/85  119/72  Pulse: 91  85 78  Resp: 14  15 14   Temp:      TempSrc:      SpO2: 100%  96% 98%  Weight:      Height:        Intake/Output Summary (Last 24 hours) at 02/16/16 1027 Last data filed at 02/16/16 1000  Gross per 24 hour  Intake           615.86 ml  Output                0 ml  Net           615.86 ml   Filed Weights   02/15/16 2104 02/16/16 0420  Weight: 95.3 kg (210 lb) 105.1 kg (231 lb 11.3 oz)    Examination:  General exam: Appears calm and comfortable  Respiratory system: Clear to auscultation. Respiratory effort normal. Cardiovascular system: S1 & S2 heard, RRR. No JVD, murmurs, rubs, gallops or clicks. No pedal edema. Gastrointestinal system: Abdomen is nondistended, soft and nontender. No organomegaly or masses felt.  Central nervous system: Alert and oriented. No focal neurological deficits. Psychiatry: Judgement and insight appear normal. Mood & affect appropriate.     Data Reviewed: I have personally reviewed following labs and imaging studies  CBC:  Recent Labs  Lab 02/15/16 2111 02/16/16 0543  WBC 6.8 9.4  NEUTROABS 3.9  --   HGB 7.2* 7.2*  HCT 27.3* 27.9*  MCV 60.0* 60.1*  PLT 297 [id number]   Basic Metabolic Panel:  Recent Labs Lab 02/15/16 2111 02/16/16 0543  NA 137 135  K 3.1* 3.6  CL 108 104  CO2 23 24  GLUCOSE 138* 140*  BUN 9 7  CREATININE 0.74 0.70  CALCIUM 8.5* 8.6*   GFR: Estimated Creatinine Clearance: 103.5 mL/min (by C-G formula based on SCr of 0.7 mg/dL). Liver Function Tests:  Recent Labs Lab 02/15/16 2111  AST 21    ALT 16  ALKPHOS 58  BILITOT 0.6  PROT 7.3  ALBUMIN 3.4*   No results for input(s): LIPASE, AMYLASE in the last 168 hours. No results for input(s): AMMONIA in the last 168 hours. Coagulation Profile:  Recent Labs Lab 02/15/16 2111  INR 1.13   Cardiac Enzymes:  Recent Labs Lab 02/15/16 2111 02/16/16 0144 02/16/16 0543  TROPONINI 0.29* 0.68* 2.11*   BNP (last 3 results) No results for input(s): PROBNP in the last 8760 hours. HbA1C: No results for input(s): HGBA1C in the last 72 hours. CBG: No results for input(s): GLUCAP in the last 168 hours. Lipid Profile:  Recent Labs  02/15/16 2111 02/16/16 0543  CHOL 196 206*  HDL 33* 39*  LDLCALC 131* 152*  TRIG 158* 75  CHOLHDL 5.9 5.3   Thyroid Function Tests: No results for input(s): TSH, T4TOTAL, FREET4, T3FREE, THYROIDAB in the last 72 hours. Anemia Panel: No results for input(s): VITAMINB12, FOLATE, FERRITIN, TIBC, IRON, RETICCTPCT in the last 72 hours. Sepsis Labs: No results for input(s): PROCALCITON, LATICACIDVEN in the last 168 hours.  Recent Results (from the past 240 hour(s))  MRSA PCR Screening     Status: None   Collection Time: 02/16/16  4:13 AM  Result Value Ref Range Status   MRSA by PCR NEGATIVE NEGATIVE Final    Comment:        The GeneXpert MRSA Assay (FDA approved for NASAL specimens only), is one component of a comprehensive MRSA colonization surveillance program. It is not intended to diagnose MRSA infection nor to guide or monitor treatment for MRSA infections.          Radiology Studies: Dg Chest Portable 1 View  Result Date: 02/15/2016 CLINICAL DATA:  Worsening chest pain and dyspnea for 1 day. EXAM: PORTABLE CHEST 1 VIEW COMPARISON:  01/12/2013 FINDINGS: A single AP portable view of the chest demonstrates no focal airspace consolidation or alveolar edema. The lungs are grossly clear. There is no large effusion or pneumothorax. Cardiac and mediastinal contours appear  unremarkable. IMPRESSION: No active disease. Electronically Signed   By: Andreas Newport M.D.   On: 02/15/2016 21:38   Ct Angio Chest/abd/pel For Dissection W And/or Wo Contrast  Result Date: 02/15/2016 CLINICAL DATA:  50 y/o F; chest pain and left arm pain to the back and vomiting since this morning. EXAM: CT ANGIOGRAPHY CHEST, ABDOMEN AND PELVIS TECHNIQUE: Multidetector CT imaging through the chest, abdomen and pelvis was performed using the standard protocol during bolus administration of intravenous contrast. Multiplanar reconstructed images and MIPs were obtained and reviewed to evaluate the vascular anatomy. CONTRAST:  100 cc Isovue 370 COMPARISON:  07/29/2013 CT of abdomen and pelvis. FINDINGS: CTA CHEST FINDINGS Cardiovascular: Preferential opacification of the thoracic aorta. No evidence of thoracic aortic aneurysm or dissection. Normal heart size. No pericardial effusion. Normal caliber main pulmonary artery. No central or lobar pulmonary  embolus. Mediastinum/Nodes: No enlarged mediastinal, hilar, or axillary lymph nodes. Thyroid gland, trachea, and esophagus demonstrate no significant findings. Lungs/Pleura: Lungs are clear. No pleural effusion or pneumothorax. Musculoskeletal: No chest wall abnormality. No acute or significant osseous findings. Review of the MIP images confirms the above findings. CTA ABDOMEN AND PELVIS FINDINGS VASCULAR Aorta: Normal caliber aorta without aneurysm, dissection, vasculitis or significant stenosis. Celiac: Patent without evidence of aneurysm, dissection, vasculitis or significant stenosis. SMA: Patent without evidence of aneurysm, dissection, vasculitis or significant stenosis. Renals: Both renal arteries are patent without evidence of aneurysm, dissection, vasculitis, fibromuscular dysplasia or significant stenosis. IMA: Patent without evidence of aneurysm, dissection, vasculitis or significant stenosis. Inflow: Patent without evidence of aneurysm, dissection,  vasculitis or significant stenosis. Veins: No obvious venous abnormality within the limitations of this arterial phase study. Review of the MIP images confirms the above findings. NON-VASCULAR Hepatobiliary: 6 mm hyper attenuating focus in segment 2 (series 7, image 66). Otherwise no focal liver abnormality is seen. No gallstones, gallbladder wall thickening, or biliary dilatation. Pancreas: Unremarkable. No pancreatic ductal dilatation or surrounding inflammatory changes. Spleen: Normal in size without focal abnormality. Several small splenules anterior to the spleen the largest measuring 17 mm per Adrenals/Urinary Tract: Adrenal glands are unremarkable. Kidneys are normal, without renal calculi, focal lesion, or hydronephrosis. Bladder is unremarkable. Stomach/Bowel: Stomach is within normal limits. Appendix appears normal. No evidence of bowel wall thickening, distention, or inflammatory changes. Lymphatic: No significant vascular findings are present. No enlarged abdominal or pelvic lymph nodes. Reproductive: Uterus and bilateral adnexa are unremarkable. Other: No abdominal wall hernia or abnormality. No abdominopelvic ascites. Musculoskeletal: No acute or significant osseous findings. Right fourth lateral rib a bone island. Mild thoracic spine endplate degenerative changes of lower lumbar facet arthrosis. Review of the MIP images confirms the above findings. IMPRESSION: 1. No acute finding as explanation for pain. 2. Normal aorta without evidence for aneurysm, dissection, vasculitis, or significant stenosis. 3. 6 mm hyperattenuating focus in liver segment 2, probably benign. In a low risk patient no additional follow-up is necessary. In a high risk patient follow-up with MRI in 3-6 months is recommended. This recommendation follows ACR consensus guidelines: Management of Incidental Liver Lesions on CT: A White Paper of the ACR Incidental Findings Committee. J Am Coll Radiol 2017; article currently in press. 4.  Otherwise unremarkable CTA of chest abdomen and pelvis. Electronically Signed   By: Kristine Garbe M.D.   On: 02/15/2016 23:55        Scheduled Meds: . aspirin  324 mg Oral NOW   Or  . aspirin  300 mg Rectal NOW  . [START ON 02/17/2016] aspirin EC  81 mg Oral Daily  . atorvastatin  80 mg Oral q1800  . carvedilol  6.25 mg Oral BID WC  . iopamidol      . lisinopril  10 mg Oral Daily  . morphine      . sodium chloride flush  3 mL Intravenous Q12H   Continuous Infusions: . sodium chloride 100 mL/hr at 02/16/16 0945  . nitroGLYCERIN 20 mcg/min (02/16/16 0915)     LOS: 0 days    Time spent: [place], PA-S [facility] Pager [phone]  If 7PM-7AM, please contact night-coverage [email or web address] Password [facility] 02/16/2016, 10:27 AM   Attending MD note  Patient was seen, examined,treatment plan was discussed with the PA-S.  I have personally reviewed the clinical findings, lab, imaging studies and management of this patient in detail. I agree  with the documentation, as recorded by the PA-S.   50 year old female with prior history of aneurysmal rupture requiring clipping-does not follow up with a primary M.D. for regular care-admitted with chest pain, uncontrolled hypertension. Troponins became significantly elevated post admission. She was started on IV nitroglycerin, IV heparin and cardiology was consulted. This morning, she continues to have intermittent pain, she was just given IV morphine prior to our rounds this morning-with subsequent relief of her chest pain.  On Exam: Gen. exam: Awake, alert, not in any distress Chest: Good air entry bilaterally, no rhonchi or rales CVS: S1-S2 regular, no murmurs Abdomen: Soft, nontender and nondistended Neurology: Non-focal Skin: No rash or lesions  Impression: Hypertensive emergency Non-STEMI Iron deficiency anemia due to menorrhagia Fibroid uterus  Plan Titrate off nitroglycerin infusion-continue  lisinopril and Coreg-titrate accordingly Will transfuse one unit of PRBC as patient continues to have chest pain Iron supplementation Will need outpatient follow-up with GI and for definite management of fibroid uterus  Rest as above  [facility]

## 2016-02-16 NOTE — Progress Notes (Signed)
    Subjective:  Denies CP at present but intermittent through the evening; dyspnea when having CP; HA   Objective:  Vitals:   02/16/16 0315 02/16/16 0330 02/16/16 0420 02/16/16 0515  BP: 167/93 157/95 (!) 145/97 139/76  Pulse: 82 94 91 96  Resp: 20 16 19 20   Temp:   98.4 F (36.9 C)   TempSrc:      SpO2: 95% 98% 100% 97%  Weight:   231 lb 11.3 oz (105.1 kg)   Height:   5\' 5"  (1.651 m)     Intake/Output from previous day:  Intake/Output Summary (Last 24 hours) at 02/16/16 0735 Last data filed at 02/16/16 0600  Gross per 24 hour  Intake             86.4 ml  Output                0 ml  Net             86.4 ml    Physical Exam: Physical exam: Well-developed well-nourished in no acute distress.  Skin is warm and dry.  HEENT is normal.  Neck is supple.  Chest is clear to auscultation with normal expansion.  Cardiovascular exam is regular rate and rhythm.  Abdominal exam nontender or distended. No masses palpated. Extremities show no edema. neuro grossly intact    Lab Results: Basic Metabolic Panel:  Recent Labs  02/15/16 2111 02/16/16 0543  NA 137 135  K 3.1* 3.6  CL 108 104  CO2 23 24  GLUCOSE 138* 140*  BUN 9 7  CREATININE 0.74 0.70  CALCIUM 8.5* 8.6*   CBC:  Recent Labs  02/15/16 2111 02/16/16 0543  WBC 6.8 9.4  NEUTROABS 3.9  --   HGB 7.2* 7.2*  HCT 27.3* 27.9*  MCV 60.0* 60.1*  PLT 297 389   Cardiac Enzymes:  Recent Labs  02/15/16 2111 02/16/16 0144 02/16/16 0543  TROPONINI 0.29* 0.68* 2.11*     Assessment/Plan:  1 NSTEMI-pt has ruled in; continue ASA, heparin; change lipitor to 80 mg daily; add metoprolol. Proceed with cardiac cath (risks and benefits including MI, CVA and death discussed and pt agrees to proceed). Note chest CT showed no dissection. Echo to assess LV function.   2 Hypertension-continue present meds and increase as needed.  3 Microcytic anemia-likely from heavy menstrual cycles; add Feso4.  4 H/O intracranial  aneurysm rupture-clipped at [facility] 2007; no records available.  5 Liver nodule-fu per primary care.  Kirk Ruths 02/16/2016, 7:35 AM

## 2016-02-16 NOTE — Progress Notes (Signed)
TR band removed at 1245 and proper dressing applied. Level 0. Pt instructed to still be cautious with arm. BP stable.

## 2016-02-16 NOTE — Progress Notes (Signed)
[facility] Progress Note Patient Name: Jessica Cruz DOB: 22-Jun-1965 MRN: [id number]   Date of Service  02/16/2016  HPI/Events of Note  Patient transferred from the emergency room to stepdown unit. Admitted with chest pain and hypertensive urgency.   Patient seen, sleeping, comfortable, not in distress.  Blood pressure 145/97, heart rate 92, respiratory rate 20, sats 96% on nasal cannula.   On nitroglycerin drip. Labs reviewed.   eICU Interventions  Continue to wean off nitroglycerin drip. Overlap with by mouth antihypertensives. Continue other meds  TH is primary service.      Intervention Category Evaluation Type: Other  [place] 02/16/2016, 4:26 AM

## 2016-02-16 NOTE — Consult Note (Signed)
Name: LENIYA FREGOSO MRN: [id number] DOB: [birth year]    ADMISSION DATE:  02/15/2016 CONSULTATION DATE:  02/16/2016  REFERRING MD :  Dr. Maryan Rued EDP  CHIEF COMPLAINT:  Chest pain  BRIEF PATIENT DESCRIPTION: 50 year old female with past medical history including cerebral aneurysm presenting with chest pain 10/31. Found to be profoundly hypertensive in the emergency department started on nitroglycerin infusion. Cardiology does not think this is ACS.  SIGNIFICANT EVENTS    STUDIES:  CT angio 10/31> No acute finding as explanation for pain. Normal aorta without evidence for aneurysm, dissection, vasculitis, or significant stenosis. 6 mm hyperattenuating focus in liver segment 2, probably benign. In a low risk patient no additional follow-up is necessary.   HISTORY OF PRESENT ILLNESS:  50 year old female with past medical history of cerebral aneurysm status post clip in 2007. She denies tobacco, alcohol, illegal drug use. Presented to the emergency department 10/31 with complaints of chest pain. She reports a strong family history of cardiac disease. On the morning of her presentation she was awoken from sleep several times with chest pain radiating to the back and left arm. She progressed to develop shortness of breath, nausea, and vomiting. For these reasons she presented to [facility] emergency department. In the ED she was profoundly hypertensive with systolic blood pressures greater than 200. Troponin elevated. She was started on a nitroglycerin infusion for blood pressure and pain control. She was seen by cardiology who feels as though this pain is likely not related to acute coronary syndrome. PCCM asked to admit.  PAST MEDICAL HISTORY :   has no past medical history on file.  has a past surgical history that includes Cerebral aneurysm repair. Prior to Admission medications   Medication Sig Start Date End Date Taking? Authorizing Provider  acetaminophen (TYLENOL) 325 MG tablet Take  325-650 mg by mouth every 6 (six) hours as needed for headache.   Yes Historical Provider, MD  diphenhydramine-acetaminophen (TYLENOL PM EXTRA STRENGTH) 25-500 MG TABS tablet Take 1 tablet by mouth at bedtime as needed (for sleep).   Yes Historical Provider, MD  ibuprofen (ADVIL,MOTRIN) 600 MG tablet Take 1 tablet (600 mg total) by mouth every 6 (six) hours as needed. Patient not taking: Reported on 02/15/2016 11/12/13   Junius Creamer, NP   Allergies  Allergen Reactions  . Pineapple Itching and Swelling    TONGUE AND THROAT AFFECTED  . Shellfish Allergy Itching and Swelling    THROAT AND TONGUE ARE AFFECTED    FAMILY HISTORY:  family history is not on file. SOCIAL HISTORY:  reports that she has never smoked. She has never used smokeless tobacco. She reports that she drinks alcohol. She reports that she does not use drugs.  REVIEW OF SYSTEMS:   Bolds are positive  Constitutional: weight loss, gain, night sweats, Fevers, chills, fatigue .  HEENT: headaches, Sore throat, sneezing, nasal congestion, post nasal drip, Difficulty swallowing, Tooth/dental problems, visual complaints visual changes, ear ache CV:  chest pain, radiates: back and L arm, however, worse with deep inspiration and palpation. ,Orthopnea, PND, swelling in lower extremities, dizziness, palpitations, syncope.  GI  heartburn, indigestion, abdominal pain, nausea, vomiting, diarrhea, change in bowel habits, loss of appetite, bloody stools.  Resp: cough, productive: , hemoptysis, dyspnea, chest pain, pleuritic.  Skin: rash or itching or icterus GU: dysuria, change in color of urine, urgency or frequency. flank pain, hematuria  MS: joint pain or swelling. decreased range of motion  Psych: change in mood or affect. depression or anxiety.  Neuro: difficulty with speech, weakness, numbness, ataxia    SUBJECTIVE:   VITAL SIGNS: Temp:  [98.2 F (36.8 C)] 98.2 F (36.8 C) (10/31 2103) Pulse Rate:  [73-102] 73 (11/01 0130) Resp:   [12-22] 17 (11/01 0130) BP: (166-200)/(91-137) 174/93 (11/01 0130) SpO2:  [98 %-100 %] 100 % (11/01 0130) Weight:  [95.3 kg (210 lb)] 95.3 kg (210 lb) (10/31 2104)  PHYSICAL EXAMINATION: General:  Obese female in no acute distress Neuro:  Alert, oriented, nonfocal HEENT:  /AT, PERRL, no JVD Cardiovascular:  RRR, no MRG Lungs:  Clear, no distress Abdomen:  Soft, non-tender, non-distended Musculoskeletal:  No acute deformity or ROM limitation Skin:  Grossly intact   Recent Labs Lab 02/15/16 2111  NA 137  K 3.1*  CL 108  CO2 23  BUN 9  CREATININE 0.74  GLUCOSE 138*    Recent Labs Lab 02/15/16 2111  HGB 7.2*  HCT 27.3*  WBC 6.8  PLT 297   Dg Chest Portable 1 View  Result Date: 02/15/2016 CLINICAL DATA:  Worsening chest pain and dyspnea for 1 day. EXAM: PORTABLE CHEST 1 VIEW COMPARISON:  01/12/2013 FINDINGS: A single AP portable view of the chest demonstrates no focal airspace consolidation or alveolar edema. The lungs are grossly clear. There is no large effusion or pneumothorax. Cardiac and mediastinal contours appear unremarkable. IMPRESSION: No active disease. Electronically Signed   By: Andreas Newport M.D.   On: 02/15/2016 21:38   Ct Angio Chest/abd/pel For Dissection W And/or Wo Contrast  Result Date: 02/15/2016 CLINICAL DATA:  50 y/o F; chest pain and left arm pain to the back and vomiting since this morning. EXAM: CT ANGIOGRAPHY CHEST, ABDOMEN AND PELVIS TECHNIQUE: Multidetector CT imaging through the chest, abdomen and pelvis was performed using the standard protocol during bolus administration of intravenous contrast. Multiplanar reconstructed images and MIPs were obtained and reviewed to evaluate the vascular anatomy. CONTRAST:  100 cc Isovue 370 COMPARISON:  07/29/2013 CT of abdomen and pelvis. FINDINGS: CTA CHEST FINDINGS Cardiovascular: Preferential opacification of the thoracic aorta. No evidence of thoracic aortic aneurysm or dissection. Normal heart size.  No pericardial effusion. Normal caliber main pulmonary artery. No central or lobar pulmonary embolus. Mediastinum/Nodes: No enlarged mediastinal, hilar, or axillary lymph nodes. Thyroid gland, trachea, and esophagus demonstrate no significant findings. Lungs/Pleura: Lungs are clear. No pleural effusion or pneumothorax. Musculoskeletal: No chest wall abnormality. No acute or significant osseous findings. Review of the MIP images confirms the above findings. CTA ABDOMEN AND PELVIS FINDINGS VASCULAR Aorta: Normal caliber aorta without aneurysm, dissection, vasculitis or significant stenosis. Celiac: Patent without evidence of aneurysm, dissection, vasculitis or significant stenosis. SMA: Patent without evidence of aneurysm, dissection, vasculitis or significant stenosis. Renals: Both renal arteries are patent without evidence of aneurysm, dissection, vasculitis, fibromuscular dysplasia or significant stenosis. IMA: Patent without evidence of aneurysm, dissection, vasculitis or significant stenosis. Inflow: Patent without evidence of aneurysm, dissection, vasculitis or significant stenosis. Veins: No obvious venous abnormality within the limitations of this arterial phase study. Review of the MIP images confirms the above findings. NON-VASCULAR Hepatobiliary: 6 mm hyper attenuating focus in segment 2 (series 7, image 66). Otherwise no focal liver abnormality is seen. No gallstones, gallbladder wall thickening, or biliary dilatation. Pancreas: Unremarkable. No pancreatic ductal dilatation or surrounding inflammatory changes. Spleen: Normal in size without focal abnormality. Several small splenules anterior to the spleen the largest measuring 17 mm per Adrenals/Urinary Tract: Adrenal glands are unremarkable. Kidneys are normal, without renal calculi, focal lesion, or hydronephrosis. Bladder is  unremarkable. Stomach/Bowel: Stomach is within normal limits. Appendix appears normal. No evidence of bowel wall thickening,  distention, or inflammatory changes. Lymphatic: No significant vascular findings are present. No enlarged abdominal or pelvic lymph nodes. Reproductive: Uterus and bilateral adnexa are unremarkable. Other: No abdominal wall hernia or abnormality. No abdominopelvic ascites. Musculoskeletal: No acute or significant osseous findings. Right fourth lateral rib a bone island. Mild thoracic spine endplate degenerative changes of lower lumbar facet arthrosis. Review of the MIP images confirms the above findings. IMPRESSION: 1. No acute finding as explanation for pain. 2. Normal aorta without evidence for aneurysm, dissection, vasculitis, or significant stenosis. 3. 6 mm hyperattenuating focus in liver segment 2, probably benign. In a low risk patient no additional follow-up is necessary. In a high risk patient follow-up with MRI in 3-6 months is recommended. This recommendation follows ACR consensus guidelines: Management of Incidental Liver Lesions on CT: A White Paper of the ACR Incidental Findings Committee. J Am Coll Radiol 2017; article currently in press. 4. Otherwise unremarkable CTA of chest abdomen and pelvis. Electronically Signed   By: Kristine Garbe M.D.   On: 02/15/2016 23:55    ASSESSMENT / PLAN:  Hypertensive urgency  - Culture monitoring - Admitted to stepdown unit under hospitalist care - Antihypertensives per cardiology, however, I feel as though this can be managed PRN with the addition of oral agents rather than continuous nitroglycerin infusion if felt not to be acute coronary syndrome. Especially considering nitroglycerin has not improved her pain. - Target 25% reduction in max SBP for first 24 hours. SBP goal 150-155mmHg - Trend troponin - Assess urine drug screen  Chest pain with elevated troponin: EKG with non-specific ST changes. - Management per cardiology - Echo pending - Trend troponin  Georgann Housekeeper, AGACNP-BC [facility] [facility] Pager [phone] or  [phone]  02/16/2016 2:23 AM  Attending Note:  50 year old female with PMH of cerebral aneurysm who presents with CP and severe HTN.  Seen by cards, recommended NTG drip.  On exam, lungs are clear and CP worse with palpation and deep inspiration.  I reviewed chest CT myself, no evidence of dissection.  Discussed with PCCM-NP.  CP:  - Morphine as needed.  - O2  - ASA  - Cards consult  HTN:  - NTG drip.  - Lisinopril.  - May need to consider beta blockers given chest pain but will defer to cards.  Positive troponin:  - Cards following.  - EKG  - Serial troponins.  Hypoxemia:  - Titrate O2 for sat of 92-95%  - Assume will come off quickly once above is addressed.  No further role for PCCM here.  Recommendations placed.  PCCM will sign off, please call back if needed.  Patient seen and examined, agree with above note.  I dictated the care and orders written for this patient under my direction.  Rush Farmer, MD [phone]

## 2016-02-16 NOTE — H&P (View-Only) (Signed)
    Subjective:  Denies CP at present but intermittent through the evening; dyspnea when having CP; HA   Objective:  Vitals:   02/16/16 0315 02/16/16 0330 02/16/16 0420 02/16/16 0515  BP: 167/93 157/95 (!) 145/97 139/76  Pulse: 82 94 91 96  Resp: 20 16 19 20   Temp:   98.4 F (36.9 C)   TempSrc:      SpO2: 95% 98% 100% 97%  Weight:   231 lb 11.3 oz (105.1 kg)   Height:   5\' 5"  (1.651 m)     Intake/Output from previous day:  Intake/Output Summary (Last 24 hours) at 02/16/16 0735 Last data filed at 02/16/16 0600  Gross per 24 hour  Intake             86.4 ml  Output                0 ml  Net             86.4 ml    Physical Exam: Physical exam: Well-developed well-nourished in no acute distress.  Skin is warm and dry.  HEENT is normal.  Neck is supple.  Chest is clear to auscultation with normal expansion.  Cardiovascular exam is regular rate and rhythm.  Abdominal exam nontender or distended. No masses palpated. Extremities show no edema. neuro grossly intact    Lab Results: Basic Metabolic Panel:  Recent Labs  02/15/16 2111 02/16/16 0543  NA 137 135  K 3.1* 3.6  CL 108 104  CO2 23 24  GLUCOSE 138* 140*  BUN 9 7  CREATININE 0.74 0.70  CALCIUM 8.5* 8.6*   CBC:  Recent Labs  02/15/16 2111 02/16/16 0543  WBC 6.8 9.4  NEUTROABS 3.9  --   HGB 7.2* 7.2*  HCT 27.3* 27.9*  MCV 60.0* 60.1*  PLT 297 389   Cardiac Enzymes:  Recent Labs  02/15/16 2111 02/16/16 0144 02/16/16 0543  TROPONINI 0.29* 0.68* 2.11*     Assessment/Plan:  1 NSTEMI-pt has ruled in; continue ASA, heparin; change lipitor to 80 mg daily; add metoprolol. Proceed with cardiac cath (risks and benefits including MI, CVA and death discussed and pt agrees to proceed). Note chest CT showed no dissection. Echo to assess LV function.   2 Hypertension-continue present meds and increase as needed.  3 Microcytic anemia-likely from heavy menstrual cycles; add Feso4.  4 H/O intracranial  aneurysm rupture-clipped at [facility] 2007; no records available.  5 Liver nodule-fu per primary care.  Kirk Ruths 02/16/2016, 7:35 AM

## 2016-02-16 NOTE — ED Notes (Signed)
Transported to [facility] unit in stable condition .

## 2016-02-16 NOTE — Progress Notes (Addendum)
ANTICOAGULATION CONSULT NOTE - Initial Consult  Pharmacy Consult for Heparin Indication: chest pain/ACS  Allergies  Allergen Reactions  . Pineapple Itching and Swelling    TONGUE AND THROAT AFFECTED  . Shellfish Allergy Itching and Swelling    THROAT AND TONGUE ARE AFFECTED    Patient Measurements: Height: 5\' 5"  (165.1 cm) Weight: 210 lb (95.3 kg) IBW/kg (Calculated) : 57 Heparin Dosing Weight: 78 kg  Vital Signs: Temp: 98.2 F (36.8 C) (10/31 2103) Temp Source: Oral (10/31 2103) BP: 163/93 (11/01 0200) Pulse Rate: 92 (11/01 0200)  Labs:  Recent Labs  02/15/16 2111 02/16/16 0144  HGB 7.2*  --   HCT 27.3*  --   PLT 297  --   APTT 28  --   LABPROT 14.5  --   INR 1.13  --   CREATININE 0.74  --   TROPONINI 0.29* 0.68*    Estimated Creatinine Clearance: 98.2 mL/min (by C-G formula based on SCr of 0.74 mg/dL).   Medical History: History reviewed. No pertinent past medical history.  Medications:  See electronic med rec  Assessment: 50 y.o. F presents with CP. Now trop up to 0.68. To begin heparin for r/o ACS. Hgb 7.2 (baseline 7.9-9 over past couple of years). Plt wnl. Noted pt with fibroid dz which causes GYN bleeding. F/u Hgb closely.  Goal of Therapy:  Heparin level 0.3-0.7 units/ml Monitor platelets by anticoagulation protocol: Yes   Plan:  Heparin IV bolus 4000 units Heparin gtt at 950 units/hr Will f/u heparin level in 6 hours Daily heparin level and CBC  Sherlon Handing, PharmD, BCPS Clinical pharmacist, pager [phone] 02/16/2016,2:51 AM

## 2016-02-16 NOTE — Consult Note (Signed)
CARDIOLOGY CONSULT NOTE      Patient ID: Jessica Cruz MRN: [id number] DOB/AGE: 48/03/1966 48 y.o.  Admit date: 02/15/2016 Referring PhysicianWhitney Maryan Rued, MD Primary PhysicianNo PCP Per Patient Primary Cardiologist new Reason for Consultation Chest pain, elevated troponin, high blood pressure  HPI: 50 year old woman with a strong family history of coronary artery disease. She has had dyspnea on exertion when walking at work for several months. She has had occasional arm pain and numbness.  Yesterday, she developed severe chest discomfort radiating to her back. She came to the emergency room. She was found to be very hypertensive with systolic greater than [id number] and diastolic greater than [id number]. Due to the character of her chest pain, chest CT was performed. This showed no aortic dissection. Troponin was found to be elevated. She is referred to [place] for further management.  The patient has not had a primary care doctor in several years. She has seen a gynecologist that there was no mention of high blood pressure. She was told she had high blood pressure a few years ago when she had a kidney stone. It was attributed to pain from the kidney stone at that time. She has never been on blood pressure medicines. She does have a younger brother who takes blood pressure medicines. She states her father was very young when he developed high blood pressure.  Earlier today, a code STEMI was called on the patient. This was canceled due to her ECG not meeting STEMI criteria. IV nitroglycerin was started. This has brought down her blood pressure somewhat. She does report a headache at this time.  Of note, she has fibroid disease and this causes GYN bleeding. She has not been taking iron regularly to treat her anemia.  Review of systems complete and found to be negative unless listed above   History reviewed. No pertinent past medical history.  No family history on file.  Social History   Social History    . Marital status: Single    Spouse name: N/A  . Number of children: N/A  . Years of education: N/A   Occupational History  . Not on file.   Social History Main Topics  . Smoking status: Never Smoker  . Smokeless tobacco: Never Used  . Alcohol use Yes     Comment: rare  . Drug use: No  . Sexual activity: Not on file   Other Topics Concern  . Not on file   Social History Narrative  . No narrative on file    Past Surgical History:  Procedure Laterality Date  . CEREBRAL ANEURYSM REPAIR        (Not in a hospital admission)  Physical Exam: Vitals:   Vitals:   02/15/16 2230 02/16/16 0000 02/16/16 0115 02/16/16 0130  BP: (!) 177/112 166/91 177/97 174/93  Pulse: 91 91 91 73  Resp: 21 20 12 17   Temp:      TempSrc:      SpO2: 100% 98% 100% 100%  Weight:      Height:       I&O's:  No intake or output data in the 24 hours ending 02/16/16 0147 Physical exam:  [place]/AT EOMI No JVD, No carotid bruit RRR S1S2  No wheezing Soft. NT, nondistended Chronic right ankle edema. No focal motor or sensory deficits Normal affect No rash  Labs:   Lab Results  Component Value Date   WBC 6.8 02/15/2016   HGB 7.2 (L) 02/15/2016   HCT 27.3 (L) 02/15/2016   MCV  60.0 (L) 02/15/2016   PLT 297 02/15/2016    Recent Labs Lab 02/15/16 2111  NA 137  K 3.1*  CL 108  CO2 23  BUN 9  CREATININE 0.74  CALCIUM 8.5*  PROT 7.3  BILITOT 0.6  ALKPHOS 58  ALT 16  AST 21  GLUCOSE 138*   Lab Results  Component Value Date   TROPONINI 0.29 (HH) 02/15/2016    Lab Results  Component Value Date   CHOL 196 02/15/2016   Lab Results  Component Value Date   HDL 33 (L) 02/15/2016   Lab Results  Component Value Date   LDLCALC 131 (H) 02/15/2016   Lab Results  Component Value Date   TRIG 158 (H) 02/15/2016   Lab Results  Component Value Date   CHOLHDL 5.9 02/15/2016   No results found for: LDLDIRECT    Radiology:No active disease on chest x-ray from today EKG:*Normal sinus  rhythm, nonspecific ST-T segment changes  ASSESSMENT AND PLAN:  Active Problems:   * No active hospital problems. *  Hypertensive urgency: Continue IV nitroglycerin to help bring down blood pressure. Start lisinopril 10 mg daily. Uptitrate this quickly as needed for blood pressure control. She may need more than one medication.  Elevated troponin: This may be strain. We'll have to check the pattern of her troponin elevation. Check echocardiogram as well. If there is a clear rise and fall, would pursue invasive ischemia workup. May need to do this regardless depending on her LV function.  Long-term, she will need follow-up with a primary care doctor.  Signed:   Mina Marble, MD, [facility] 02/16/2016, 1:47 AM

## 2016-02-16 NOTE — Interval H&P Note (Signed)
Cath Lab Visit (complete for each Cath Lab visit)  Clinical Evaluation Leading to the Procedure:   ACS: Yes.    Non-ACS:    Anginal Classification: CCS IV  Anti-ischemic medical therapy: Maximal Therapy (2 or more classes of medications)  Non-Invasive Test Results: No non-invasive testing performed  Prior CABG: No previous CABG      History and Physical Interval Note:  02/16/2016 8:39 AM  Jessica Cruz  has presented today for surgery, with the diagnosis of elevator trip  The various methods of treatment have been discussed with the patient and family. After consideration of risks, benefits and other options for treatment, the patient has consented to  Procedure(s): Left Heart Cath and Coronary Angiography (N/A) as a surgical intervention .  The patient's history has been reviewed, patient examined, no change in status, stable for surgery.  I have reviewed the patient's chart and labs.  Questions were answered to the patient's satisfaction.     Kathlyn Sacramento

## 2016-02-17 ENCOUNTER — Inpatient Hospital Stay (HOSPITAL_COMMUNITY): Payer: 59

## 2016-02-17 DIAGNOSIS — D500 Iron deficiency anemia secondary to blood loss (chronic): Secondary | ICD-10-CM

## 2016-02-17 DIAGNOSIS — I10 Essential (primary) hypertension: Secondary | ICD-10-CM

## 2016-02-17 DIAGNOSIS — I214 Non-ST elevation (NSTEMI) myocardial infarction: Secondary | ICD-10-CM

## 2016-02-17 LAB — BASIC METABOLIC PANEL
Anion gap: 6 (ref 5–15)
BUN: 9 mg/dL (ref 6–20)
CO2: 24 mmol/L (ref 22–32)
Calcium: 8.4 mg/dL — ABNORMAL LOW (ref 8.9–10.3)
Chloride: 107 mmol/L (ref 101–111)
Creatinine, Ser: 0.77 mg/dL (ref 0.44–1.00)
GFR calc Af Amer: >60 mL/min (ref >60)
GFR calc non Af Amer: >60 mL/min (ref >60)
Glucose, Bld: 126 mg/dL — ABNORMAL HIGH (ref 65–99)
Potassium: 3.7 mmol/L (ref 3.5–5.1)
Sodium: 137 mmol/L (ref 135–145)

## 2016-02-17 LAB — HEMOGLOBIN A1C
Hgb A1c MFr Bld: 6.2 % — ABNORMAL HIGH (ref 4.8–5.6)
Mean Plasma Glucose: 131 mg/dL

## 2016-02-17 LAB — CBC
HCT: 28.9 % — ABNORMAL LOW (ref 36.0–46.0)
Hemoglobin: 7.8 g/dL — ABNORMAL LOW (ref 12.0–15.0)
MCH: 17 pg — ABNORMAL LOW (ref 26.0–34.0)
MCHC: 27 g/dL — ABNORMAL LOW (ref 30.0–36.0)
MCV: 63 fL — ABNORMAL LOW (ref 78.0–100.0)
Platelets: 260 10*3/uL (ref 150–400)
RBC: 4.59 MIL/uL (ref 3.87–5.11)
RDW: 21.6 % — ABNORMAL HIGH (ref 11.5–15.5)
WBC: 6.8 10*3/uL (ref 4.0–10.5)

## 2016-02-17 LAB — ECHOCARDIOGRAM COMPLETE
Height: 65 in
Weight: 3707.26 oz

## 2016-02-17 LAB — TYPE AND SCREEN
ABO/RH(D): O POS
Antibody Screen: NEGATIVE
Unit division: 0

## 2016-02-17 MED ORDER — CARVEDILOL 12.5 MG PO TABS
12.5000 mg | ORAL_TABLET | Freq: Two times a day (BID) | ORAL | Status: DC
  Administered 2016-02-17 – 2016-02-18 (×3): 12.5 mg via ORAL
  Filled 2016-02-17 (×3): qty 1

## 2016-02-17 MED ORDER — HYDROCHLOROTHIAZIDE 12.5 MG PO CAPS
12.5000 mg | ORAL_CAPSULE | Freq: Every day | ORAL | Status: DC
  Administered 2016-02-17 – 2016-02-18 (×2): 12.5 mg via ORAL
  Filled 2016-02-17 (×2): qty 1

## 2016-02-17 NOTE — Progress Notes (Signed)
     Marcene Brawn, RN called about ms Kochan's chest pain. Discussed with Dr. Stanford Breed who felt we should treat with tylenol.   Angelena Form PA-C  [facility]

## 2016-02-17 NOTE — Progress Notes (Signed)
*  PRELIMINARY RESULTS* Echocardiogram 2D Echocardiogram has been performed.  Leavy Cella 02/17/2016, 9:18 AM

## 2016-02-17 NOTE — Progress Notes (Signed)
PROGRESS NOTE    Jessica Cruz  [id number] DOB: [birth year] DOA: 02/15/2016 PCP: No PCP Per Patient    Brief Narrative: Jessica Cruz is a 50 yo female with a medical history significant for cerebral aneurysm repair, Fe def anemia from fibroids, presented to the hospital on 10/31 with chest pain and hypertensive emergency. She was admitted and started on nitroglycerin infusion, her troponins gradually increased, cardiology was consulted, she underwent a cardiac catheterization that showed normal coronaries. See below for further details    Assessment & Plan: NSTEMI: Troponin markedly elevated-underwent LHC-showed normal coronaries. Chest CT showed no dissection. Had 1 unit of PRBC transfused 11/1. Continue aspirin, statin and Coreg.Has been weaned off nitroglycerin drip. Await Echo.  Hypertensive emergency: Started on a nitroglycerin infusion on admission which has been weaned off-BP still elevated-will continue to adjust dosing of lisinopril and Coreg. Cardiology has added HCTZ  Microcytic anemia due to Fe Deficiency: Likely from menorrhagia-has known history of fibroid uterus. Since continues to have chest pain, had 1 unit of PRBC transfused 11/1. Continue IV Iron, plan to start oral Fe supplementation on discharge. Patient to follow up with her primary GYN for definite management of fibroid uterus  H/O intracranial aneurysm rupture: Clipped at [facility] 2007; no records available.Needs BP optimization  Liver nodule: fu per primary care   DVT prophylaxis: Lovenox Code Status: Full Family Communication: None at bedside.  Disposition Plan: Home in next 1-2 days-transfer to telemetry today  Consultants:   Cardiology  Procedures:   Cardiac catheterization 11/1 shows left ventricular ejection fraction is 55-65% by visual estimate.Very tortuous vessels suggestive of prolonged hypertension. Normal LV systolic function and LV end-diastolic pressure.  Echo 11/1  pending  Antimicrobials:   None    Subjective: Patient is doing much better today. She continues to have chest pain but this is significantly improved.   Objective: Vitals:   02/17/16 0400 02/17/16 0819 02/17/16 0821 02/17/16 0823  BP: (!) 150/91 (!) 148/88 (!) 148/88   Pulse: 78 83  (P) 83  Resp: 20   (P) 18  Temp:    (P) 98.4 F (36.9 C)  TempSrc:    (P) Oral  SpO2: 96%     Weight:      Height:        Intake/Output Summary (Last 24 hours) at 02/17/16 0830 Last data filed at 02/16/16 1647  Gross per 24 hour  Intake          1291.48 ml  Output                0 ml  Net          1291.48 ml   Filed Weights   02/15/16 2104 02/16/16 0420  Weight: 95.3 kg (210 lb) 105.1 kg (231 lb 11.3 oz)    Examination:  General exam: Appears calm and comfortable  Respiratory system: Clear to auscultation. Respiratory effort normal. Cardiovascular system: S1 & S2 heard, RRR. No JVD, murmurs, rubs, gallops or clicks. No pedal edema. Central nervous system: Alert and oriented. No focal neurological deficits. Psychiatry: Judgement and insight appear normal. Mood & affect appropriate.     Data Reviewed: I have personally reviewed following labs and imaging studies  CBC:  Recent Labs Lab 02/15/16 2111 02/16/16 0543 02/16/16 1805 02/17/16 0331  WBC 6.8 9.4 8.9 6.8  NEUTROABS 3.9  --   --   --   HGB 7.2* 7.2* 8.2* 7.8*  HCT 27.3* 27.9* 30.4* 28.9*  MCV 60.0* 60.1* 63.1*  63.0*  PLT 297 389 301 [id number]   Basic Metabolic Panel:  Recent Labs Lab 02/15/16 2111 02/16/16 0543 02/17/16 0331  NA 137 135 137  K 3.1* 3.6 3.7  CL 108 104 107  CO2 23 24 24   GLUCOSE 138* 140* 126*  BUN 9 7 9   CREATININE 0.74 0.70 0.77  CALCIUM 8.5* 8.6* 8.4*   GFR: Estimated Creatinine Clearance: 103.5 mL/min (by C-G formula based on SCr of 0.77 mg/dL). Liver Function Tests:  Recent Labs Lab 02/15/16 2111  AST 21  ALT 16  ALKPHOS 58  BILITOT 0.6  PROT 7.3  ALBUMIN 3.4*   No results for  input(s): LIPASE, AMYLASE in the last 168 hours. No results for input(s): AMMONIA in the last 168 hours. Coagulation Profile:  Recent Labs Lab 02/15/16 2111  INR 1.13   Cardiac Enzymes:  Recent Labs Lab 02/15/16 2111 02/16/16 0144 02/16/16 0543 02/16/16 1229 02/16/16 1805  TROPONINI 0.29* 0.68* 2.11* 3.43* 3.03*   BNP (last 3 results) No results for input(s): PROBNP in the last 8760 hours. HbA1C:  Recent Labs  02/16/16 0543  HGBA1C 6.2*   CBG: No results for input(s): GLUCAP in the last 168 hours. Lipid Profile:  Recent Labs  02/15/16 2111 02/16/16 0543  CHOL 196 206*  HDL 33* 39*  LDLCALC 131* 152*  TRIG 158* 75  CHOLHDL 5.9 5.3   Thyroid Function Tests: No results for input(s): TSH, T4TOTAL, FREET4, T3FREE, THYROIDAB in the last 72 hours. Anemia Panel:  Recent Labs  02/16/16 1005  VITAMINB12 637  FOLATE 13.4  FERRITIN 4*  TIBC 441  IRON 13*  RETICCTPCT 1.2   Sepsis Labs: No results for input(s): PROCALCITON, LATICACIDVEN in the last 168 hours.  Recent Results (from the past 240 hour(s))  MRSA PCR Screening     Status: None   Collection Time: 02/16/16  4:13 AM  Result Value Ref Range Status   MRSA by PCR NEGATIVE NEGATIVE Final    Comment:        The GeneXpert MRSA Assay (FDA approved for NASAL specimens only), is one component of a comprehensive MRSA colonization surveillance program. It is not intended to diagnose MRSA infection nor to guide or monitor treatment for MRSA infections.          Radiology Studies: Dg Chest Portable 1 View  Result Date: 02/15/2016 CLINICAL DATA:  Worsening chest pain and dyspnea for 1 day. EXAM: PORTABLE CHEST 1 VIEW COMPARISON:  01/12/2013 FINDINGS: A single AP portable view of the chest demonstrates no focal airspace consolidation or alveolar edema. The lungs are grossly clear. There is no large effusion or pneumothorax. Cardiac and mediastinal contours appear unremarkable. IMPRESSION: No active  disease. Electronically Signed   By: Andreas Newport M.D.   On: 02/15/2016 21:38   Ct Angio Chest/abd/pel For Dissection W And/or Wo Contrast  Result Date: 02/15/2016 CLINICAL DATA:  50 y/o F; chest pain and left arm pain to the back and vomiting since this morning. EXAM: CT ANGIOGRAPHY CHEST, ABDOMEN AND PELVIS TECHNIQUE: Multidetector CT imaging through the chest, abdomen and pelvis was performed using the standard protocol during bolus administration of intravenous contrast. Multiplanar reconstructed images and MIPs were obtained and reviewed to evaluate the vascular anatomy. CONTRAST:  100 cc Isovue 370 COMPARISON:  07/29/2013 CT of abdomen and pelvis. FINDINGS: CTA CHEST FINDINGS Cardiovascular: Preferential opacification of the thoracic aorta. No evidence of thoracic aortic aneurysm or dissection. Normal heart size. No pericardial effusion. Normal caliber main pulmonary artery. No central  or lobar pulmonary embolus. Mediastinum/Nodes: No enlarged mediastinal, hilar, or axillary lymph nodes. Thyroid gland, trachea, and esophagus demonstrate no significant findings. Lungs/Pleura: Lungs are clear. No pleural effusion or pneumothorax. Musculoskeletal: No chest wall abnormality. No acute or significant osseous findings. Review of the MIP images confirms the above findings. CTA ABDOMEN AND PELVIS FINDINGS VASCULAR Aorta: Normal caliber aorta without aneurysm, dissection, vasculitis or significant stenosis. Celiac: Patent without evidence of aneurysm, dissection, vasculitis or significant stenosis. SMA: Patent without evidence of aneurysm, dissection, vasculitis or significant stenosis. Renals: Both renal arteries are patent without evidence of aneurysm, dissection, vasculitis, fibromuscular dysplasia or significant stenosis. IMA: Patent without evidence of aneurysm, dissection, vasculitis or significant stenosis. Inflow: Patent without evidence of aneurysm, dissection, vasculitis or significant stenosis.  Veins: No obvious venous abnormality within the limitations of this arterial phase study. Review of the MIP images confirms the above findings. NON-VASCULAR Hepatobiliary: 6 mm hyper attenuating focus in segment 2 (series 7, image 66). Otherwise no focal liver abnormality is seen. No gallstones, gallbladder wall thickening, or biliary dilatation. Pancreas: Unremarkable. No pancreatic ductal dilatation or surrounding inflammatory changes. Spleen: Normal in size without focal abnormality. Several small splenules anterior to the spleen the largest measuring 17 mm per Adrenals/Urinary Tract: Adrenal glands are unremarkable. Kidneys are normal, without renal calculi, focal lesion, or hydronephrosis. Bladder is unremarkable. Stomach/Bowel: Stomach is within normal limits. Appendix appears normal. No evidence of bowel wall thickening, distention, or inflammatory changes. Lymphatic: No significant vascular findings are present. No enlarged abdominal or pelvic lymph nodes. Reproductive: Uterus and bilateral adnexa are unremarkable. Other: No abdominal wall hernia or abnormality. No abdominopelvic ascites. Musculoskeletal: No acute or significant osseous findings. Right fourth lateral rib a bone island. Mild thoracic spine endplate degenerative changes of lower lumbar facet arthrosis. Review of the MIP images confirms the above findings. IMPRESSION: 1. No acute finding as explanation for pain. 2. Normal aorta without evidence for aneurysm, dissection, vasculitis, or significant stenosis. 3. 6 mm hyperattenuating focus in liver segment 2, probably benign. In a low risk patient no additional follow-up is necessary. In a high risk patient follow-up with MRI in 3-6 months is recommended. This recommendation follows ACR consensus guidelines: Management of Incidental Liver Lesions on CT: A White Paper of the ACR Incidental Findings Committee. J Am Coll Radiol 2017; article currently in press. 4. Otherwise unremarkable CTA of chest  abdomen and pelvis. Electronically Signed   By: Kristine Garbe M.D.   On: 02/15/2016 23:55        Scheduled Meds: . aspirin EC  81 mg Oral Daily  . atorvastatin  80 mg Oral q1800  . carvedilol  12.5 mg Oral BID WC  . enoxaparin (LOVENOX) injection  40 mg Subcutaneous Q24H  . ferumoxytol  510 mg Intravenous Q72H  . hydrochlorothiazide  12.5 mg Oral Daily  . lisinopril  10 mg Oral Daily  . sodium chloride flush  3 mL Intravenous Q12H   Continuous Infusions:    LOS: 1 day    Time spent: [place], PA-S [facility] Pager [phone]  If 7PM-7AM, please contact night-coverage [email or web address] Password [facility] 02/17/2016, 8:30 AM   Attending MD note  Patient was seen, examined,treatment plan was discussed with the PA-S.  I have personally reviewed the clinical findings, lab, imaging studies and management of this patient in detail. I agree with the documentation, as recorded by the PA-S.   Patient is doing much better, chest pain is slowly resolving-I suspect noncardiac pain at  this point.  On Exam: Gen. exam: Awake, alert, not in any distress Chest: Good air entry bilaterally, no rhonchi or rales CVS: S1-S2 regular, no murmurs Abdomen: Soft, nontender and nondistended Neurology: Non-focal Skin: No rash or lesions  Plan Titrated off Nitro drip-we'll continue to adjust dosing of Coreg, lisinopril and HCTZ Continue iron supplementation Transfer to telemetry unit, if continues to do well, suspect home on 11/3  Rest as above  [facility]

## 2016-02-17 NOTE — Progress Notes (Signed)
    Subjective:  Mild dyspnea and chest heaviness   Objective:  Vitals:   02/16/16 1942 02/16/16 2337 02/17/16 0354 02/17/16 0400  BP: (!) 161/106 (!) 150/77 (!) 150/91 (!) 150/91  Pulse: 98 83 (!) 103 78  Resp: 19 18 (!) 24 20  Temp: 98.4 F (36.9 C) 98.8 F (37.1 C) 98.8 F (37.1 C)   TempSrc: Oral Oral Oral   SpO2: 97% 95% 96% 96%  Weight:      Height:        Intake/Output from previous day:  Intake/Output Summary (Last 24 hours) at 02/17/16 0740 Last data filed at 02/16/16 1647  Gross per 24 hour  Intake          1291.48 ml  Output                0 ml  Net          1291.48 ml    Physical Exam: Physical exam: Well-developed well-nourished in no acute distress.  Skin is warm and dry.  HEENT is normal.  Neck is supple.  Chest is clear to auscultation with normal expansion.  Cardiovascular exam is regular rate and rhythm.  Abdominal exam nontender or distended. No masses palpated. Extremities show no edema. Radial cath site with no hematoma neuro grossly intact    Lab Results: Basic Metabolic Panel:  Recent Labs  02/16/16 0543 02/17/16 0331  NA 135 137  K 3.6 3.7  CL 104 107  CO2 24 24  GLUCOSE 140* 126*  BUN 7 9  CREATININE 0.70 0.77  CALCIUM 8.6* 8.4*   CBC:  Recent Labs  02/15/16 2111  02/16/16 1805 02/17/16 0331  WBC 6.8  < > 8.9 6.8  NEUTROABS 3.9  --   --   --   HGB 7.2*  < > 8.2* 7.8*  HCT 27.3*  < > 30.4* 28.9*  MCV 60.0*  < > 63.1* 63.0*  PLT 297  < > 301 260  < > = values in this interval not displayed. Cardiac Enzymes:  Recent Labs  02/16/16 0543 02/16/16 1229 02/16/16 1805  TROPONINI 2.11* 3.43* 3.03*     Assessment/Plan:  1 NSTEMI-pt with abnormal troponins but cath with no obstructive CAD; likely demand ischemia. Note chest CT showed no dissection. Echo to assess LV function.   2 Hypertension-BP elevated; change coreg to 12.5 BID; continue lisinopril and increase as needed; add HCTZ 12.5 mg daily.  3 Microcytic  anemia-likely from heavy menstrual cycles; further WU and management per primary care.  4 H/O intracranial aneurysm rupture-clipped at [facility] 2007; no records available.  5 Liver nodule-fu per primary care.  Jessica Cruz 02/17/2016, 7:40 AM

## 2016-02-18 LAB — CBC
HCT: 30.7 % — ABNORMAL LOW (ref 36.0–46.0)
Hemoglobin: 8.3 g/dL — ABNORMAL LOW (ref 12.0–15.0)
MCH: 17 pg — ABNORMAL LOW (ref 26.0–34.0)
MCHC: 27 g/dL — ABNORMAL LOW (ref 30.0–36.0)
MCV: 63 fL — ABNORMAL LOW (ref 78.0–100.0)
Platelets: 287 10*3/uL (ref 150–400)
RBC: 4.87 MIL/uL (ref 3.87–5.11)
RDW: 22.3 % — ABNORMAL HIGH (ref 11.5–15.5)
WBC: 8.5 10*3/uL (ref 4.0–10.5)

## 2016-02-18 MED ORDER — LISINOPRIL 20 MG PO TABS
20.0000 mg | ORAL_TABLET | Freq: Every day | ORAL | Status: DC
Start: 2016-02-18 — End: 2016-02-18
  Administered 2016-02-18: 20 mg via ORAL
  Filled 2016-02-18: qty 1

## 2016-02-18 MED ORDER — CARVEDILOL 12.5 MG PO TABS: 12.5000 mg | ORAL_TABLET | Freq: Two times a day (BID) | ORAL | 0 refills | Status: DC

## 2016-02-18 MED ORDER — ASPIRIN 81 MG PO TBEC: 81.0000 mg | DELAYED_RELEASE_TABLET | Freq: Every day | ORAL | Status: DC

## 2016-02-18 MED ORDER — ATORVASTATIN CALCIUM 40 MG PO TABS
40.0000 mg | ORAL_TABLET | Freq: Every day | ORAL | 0 refills | Status: DC
Start: 2016-02-18 — End: 2016-10-09

## 2016-02-18 MED ORDER — LISINOPRIL 20 MG PO TABS: 20.0000 mg | ORAL_TABLET | Freq: Every day | ORAL | 0 refills | Status: DC

## 2016-02-18 MED ORDER — HYDROCHLOROTHIAZIDE 12.5 MG PO CAPS: 12.5000 mg | ORAL_CAPSULE | Freq: Every day | ORAL | 0 refills | Status: DC

## 2016-02-18 MED ORDER — FERROUS SULFATE 325 (65 FE) MG PO TABS: 325.0000 mg | ORAL_TABLET | Freq: Two times a day (BID) | ORAL | 0 refills | Status: DC

## 2016-02-18 NOTE — Progress Notes (Signed)
    Subjective:  Denies dyspnea or chest pain   Objective:  Vitals:   02/17/16 1951 02/17/16 2332 02/18/16 0307 02/18/16 0756  BP: (!) 157/81 (!) 150/102 (!) 161/92 (!) 154/91  Pulse: 77 83 83 74  Resp: (!) 22 (!) 33 20 (!) 25  Temp: 98.6 F (37 C) 98.1 F (36.7 C) 98.3 F (36.8 C) 98.2 F (36.8 C)  TempSrc: Oral Oral Oral Oral  SpO2: 98% 98% 98% 96%  Weight:      Height:        Intake/Output from previous day:  Intake/Output Summary (Last 24 hours) at 02/18/16 0800 Last data filed at 02/17/16 1200  Gross per 24 hour  Intake              240 ml  Output                0 ml  Net              240 ml    Physical Exam: Physical exam: Well-developed well-nourished in no acute distress.  Skin is warm and dry.  HEENT is normal.  Neck is supple.  Chest is clear to auscultation with normal expansion.  Cardiovascular exam is regular rate and rhythm.  Abdominal exam nontender or distended. No masses palpated. Extremities show no edema. Radial cath site with no hematoma neuro grossly intact    Lab Results: Basic Metabolic Panel:  Recent Labs  02/16/16 0543 02/17/16 0331  NA 135 137  K 3.6 3.7  CL 104 107  CO2 24 24  GLUCOSE 140* 126*  BUN 7 9  CREATININE 0.70 0.77  CALCIUM 8.6* 8.4*   CBC:  Recent Labs  02/15/16 2111  02/17/16 0331 02/18/16 0241  WBC 6.8  < > 6.8 8.5  NEUTROABS 3.9  --   --   --   HGB 7.2*  < > 7.8* 8.3*  HCT 27.3*  < > 28.9* 30.7*  MCV 60.0*  < > 63.0* 63.0*  PLT 297  < > 260 287  < > = values in this interval not displayed. Cardiac Enzymes:  Recent Labs  02/16/16 0543 02/16/16 1229 02/16/16 1805  TROPONINI 2.11* 3.43* 3.03*     Assessment/Plan:  1 NSTEMI-pt with abnormal troponins but cath with no obstructive CAD; likely demand ischemia. Note chest CT showed no dissection. Echo shows normal LV function.  2 Hypertension-BP elevated; continue coreg 12.5 BID; continue lisinopril(increase to 20 mg daily) and HCTZ 12.5 mg  daily. Check BMET one week.  3 Microcytic anemia-likely from heavy menstrual cycles; further WU and management per primary care.  4 H/O intracranial aneurysm rupture-clipped at [facility] 2007; no records available.  5 Liver nodule-fu per primary care.  Pt can be DCed from a cardiac standpoint. I have asked her to follow her blood pressure closely at home. Please arrange transition of care appointment with one of our extenders in one week. Follow-up with Dr. Irish Lack 3 months.  Kirk Ruths 02/18/2016, 8:00 AM

## 2016-02-18 NOTE — Discharge Summary (Addendum)
PATIENT DETAILS Name: Jessica Cruz Age: 50 y.o. Sex: female Date of Birth: [birth year] MRN: [id number]. Admitting Physician: Etta Quill, DO PCP:No PCP Per Patient  Admit Date: 02/15/2016 Discharge date: 02/18/2016  Recommendations for Outpatient Follow-up:  1. Follow up with PCP in 1-2 weeks 2. Consider MRI of the liver in 6 months 3. Refer to GI for Colonoscopy 4. Follow CBC-started on Fe supplementation 5. Ensure follow up with GYN-re fibroids   Admitted From:  Home  Disposition: [place]: No  Equipment/Devices: None  Discharge Condition: Stable  CODE STATUS: FULL CODE  Diet recommendation:  Heart Healthy   Brief Summary: See H&P, Labs, Consult and Test reports for all details in brief, patient with a medical history significant for cerebral aneurysm repair, Fe def anemia from fibroids, presented to the hospital on 10/31 with chest pain and hypertensive emergency. She was admitted and started on nitroglycerin infusion, her troponins gradually increased, cardiology was consulted, she underwent a cardiac catheterization that showed normal coronaries.  Echo shows LV EF 60-65%. Patient is stable and denies chest pain.    Brief Hospital Course: Elevated Troponin due to demand ischemia:Troponin markedly elevated-underwent LHC-showed normal coronaries. Chest CT showed no dissection. Echo shows LV EF 60-65%. Had 1 unit of PRBC transfused 11/1. Weaned off nitroglycerin drip. Patient is stable- denies chest pain and has been ambulating without difficulties. Continue on aspirin, statin, and Coreg as outpatient.Cards will arrange for outpatient follow up  Hypertensive emergency:Started on a nitroglycerin infusion on admission which has been weaned off-BP managed with  lisinopril, Coreg, and HCTZ- will continue these meds as outpatient. Further optimization of her anti-hypertensive regimen deferred to the outpatient setting  Microcytic anemia due to Fe  Deficiency:Likely from menorrhagia-has known history of fibroid uterus. Had 1 unit of PRBC transfused 11/1. Start oral Fe supplementation as outpatient. Patient to follow up with her primary GYN for definite management of fibroid uterus. Have asked patient to get a referral to GI for colonoscopy  H/O intracranial aneurysm rupture:Clipped at [facility] 2007; no records available.Needs BP optimization  Liver nodule:fu per primary care. Repeat CT of chest/abd/pel in 6 months. May require MRI of liver in 3-6 months.   Procedures/Studies: - Cardiac catheterization that showed normal coronaries.   - Echo shows LV EF 60-65%.    Discharge Diagnoses:  Principal Problem:   ACS (acute coronary syndrome) ([place]) Active Problems:   Hypertension   Hypertensive emergency   Hypoxemia   Chest pain   NSTEMI (non-ST elevated myocardial infarction) ([place])   Iron deficiency anemia due to chronic blood loss   Discharge Instructions:  Activity:  As tolerated with Full fall precautions use walker/cane & assistance as needed  Discharge Instructions    Call MD for:  persistant dizziness or light-headedness    Complete by:  As directed    Call MD for:  severe uncontrolled pain    Complete by:  As directed    Diet - low sodium heart healthy    Complete by:  As directed    Discharge instructions    Complete by:  As directed    Follow with Primary MD of your choice in 1-2 weeks  Please ask your primary MD to refer you for a Colonoscopy  Please ask your primary MD to monitor the Liver Nodule seen incidentally on a CT Scan of your chest-you may require a MRI of your liver in 3-6 months.   Follow with your GYN MD-you have a hx of Fibroids-you  also have Iron deficiency anemia-continue taking your Iron pills on discharge  Please get a complete blood count and chemistry panel checked by your Primary MD at your next visit, and again as instructed by your Primary MD.  Get Medicines reviewed and adjusted: Please  take all your medications with you for your next visit with your Primary MD  Laboratory/radiological data: Please request your Primary MD to go over all hospital tests and procedure/radiological results at the follow up, please ask your Primary MD to get all Hospital records sent to his/her office.  In some cases, they will be blood work, cultures and biopsy results pending at the time of your discharge. Please request that your primary care M.D. follows up on these results.  Also Note the following: If you experience worsening of your admission symptoms, develop shortness of breath, life threatening emergency, suicidal or homicidal thoughts you must seek medical attention immediately by calling 911 or calling your MD immediately  if symptoms less severe.  You must read complete instructions/literature along with all the possible adverse reactions/side effects for all the Medicines you take and that have been prescribed to you. Take any new Medicines after you have completely understood and accpet all the possible adverse reactions/side effects.   Do not drive when taking Pain medications or sleeping medications (Benzodaizepines)  Do not take more than prescribed Pain, Sleep and Anxiety Medications. It is not advisable to combine anxiety,sleep and pain medications without talking with your primary care practitioner  Special Instructions: If you have smoked or chewed Tobacco  in the last 2 yrs please stop smoking, stop any regular Alcohol  and or any Recreational drug use.  Wear Seat belts while driving.  Please note: You were cared for by a hospitalist during your hospital stay. Once you are discharged, your primary care physician will handle any further medical issues. Please note that NO REFILLS for any discharge medications will be authorized once you are discharged, as it is imperative that you return to your primary care physician (or establish a relationship with a primary care physician  if you do not have one) for your post hospital discharge needs so that they can reassess your need for medications and monitor your lab values.   Increase activity slowly    Complete by:  As directed        Medication List    STOP taking these medications   ibuprofen 600 MG tablet Commonly known as:  ADVIL,MOTRIN   TYLENOL PM EXTRA STRENGTH 25-500 MG Tabs tablet Generic drug:  diphenhydramine-acetaminophen     TAKE these medications   acetaminophen 325 MG tablet Commonly known as:  TYLENOL Take 325-650 mg by mouth every 6 (six) hours as needed for headache.   aspirin 81 MG EC tablet Take 1 tablet (81 mg total) by mouth daily. Start taking on:  02/19/2016   atorvastatin 40 MG tablet Commonly known as:  LIPITOR Take 1 tablet (40 mg total) by mouth daily at 6 PM.   carvedilol 12.5 MG tablet Commonly known as:  COREG Take 1 tablet (12.5 mg total) by mouth 2 (two) times daily with a meal.   ferrous sulfate 325 (65 FE) MG tablet Take 1 tablet (325 mg total) by mouth 2 (two) times daily with a meal.   hydrochlorothiazide 12.5 MG capsule Commonly known as:  MICROZIDE Take 1 capsule (12.5 mg total) by mouth daily. Start taking on:  02/19/2016   lisinopril 20 MG tablet Commonly known as:  PRINIVIL,ZESTRIL Take  1 tablet (20 mg total) by mouth daily. Start taking on:  02/19/2016      Follow-up Information    PCP of your choice in 1-2 weeks .   Contact information: You can call [facility] at [phone] to arrange for a PCP       your GYN MD. Schedule an appointment as soon as possible for a visit in 2 week(s).   Why:  for management of fibroids       Dayna N Dunn, PA-C Follow up on 02/25/2016.   Specialties:  Cardiology, Radiology Why:  @ 8:30am. Please arrive at least 15 minutes prior to your appointment Contact information: [address] [place] [address] [phone]          Allergies  Allergen Reactions  . Pineapple Itching  and Swelling    TONGUE AND THROAT AFFECTED  . Shellfish Allergy Itching and Swelling    THROAT AND TONGUE ARE AFFECTED    Consultations:   cardiology    Other Procedures/Studies: Dg Chest Portable 1 View  Result Date: 02/15/2016 CLINICAL DATA:  Worsening chest pain and dyspnea for 1 day. EXAM: PORTABLE CHEST 1 VIEW COMPARISON:  01/12/2013 FINDINGS: A single AP portable view of the chest demonstrates no focal airspace consolidation or alveolar edema. The lungs are grossly clear. There is no large effusion or pneumothorax. Cardiac and mediastinal contours appear unremarkable. IMPRESSION: No active disease. Electronically Signed   By: Andreas Newport M.D.   On: 02/15/2016 21:38   Ct Angio Chest/abd/pel For Dissection W And/or Wo Contrast  Result Date: 02/15/2016 CLINICAL DATA:  50 y/o F; chest pain and left arm pain to the back and vomiting since this morning. EXAM: CT ANGIOGRAPHY CHEST, ABDOMEN AND PELVIS TECHNIQUE: Multidetector CT imaging through the chest, abdomen and pelvis was performed using the standard protocol during bolus administration of intravenous contrast. Multiplanar reconstructed images and MIPs were obtained and reviewed to evaluate the vascular anatomy. CONTRAST:  100 cc Isovue 370 COMPARISON:  07/29/2013 CT of abdomen and pelvis. FINDINGS: CTA CHEST FINDINGS Cardiovascular: Preferential opacification of the thoracic aorta. No evidence of thoracic aortic aneurysm or dissection. Normal heart size. No pericardial effusion. Normal caliber main pulmonary artery. No central or lobar pulmonary embolus. Mediastinum/Nodes: No enlarged mediastinal, hilar, or axillary lymph nodes. Thyroid gland, trachea, and esophagus demonstrate no significant findings. Lungs/Pleura: Lungs are clear. No pleural effusion or pneumothorax. Musculoskeletal: No chest wall abnormality. No acute or significant osseous findings. Review of the MIP images confirms the above findings. CTA ABDOMEN AND PELVIS  FINDINGS VASCULAR Aorta: Normal caliber aorta without aneurysm, dissection, vasculitis or significant stenosis. Celiac: Patent without evidence of aneurysm, dissection, vasculitis or significant stenosis. SMA: Patent without evidence of aneurysm, dissection, vasculitis or significant stenosis. Renals: Both renal arteries are patent without evidence of aneurysm, dissection, vasculitis, fibromuscular dysplasia or significant stenosis. IMA: Patent without evidence of aneurysm, dissection, vasculitis or significant stenosis. Inflow: Patent without evidence of aneurysm, dissection, vasculitis or significant stenosis. Veins: No obvious venous abnormality within the limitations of this arterial phase study. Review of the MIP images confirms the above findings. NON-VASCULAR Hepatobiliary: 6 mm hyper attenuating focus in segment 2 (series 7, image 66). Otherwise no focal liver abnormality is seen. No gallstones, gallbladder wall thickening, or biliary dilatation. Pancreas: Unremarkable. No pancreatic ductal dilatation or surrounding inflammatory changes. Spleen: Normal in size without focal abnormality. Several small splenules anterior to the spleen the largest measuring 17 mm per Adrenals/Urinary Tract: Adrenal glands are unremarkable. Kidneys  are normal, without renal calculi, focal lesion, or hydronephrosis. Bladder is unremarkable. Stomach/Bowel: Stomach is within normal limits. Appendix appears normal. No evidence of bowel wall thickening, distention, or inflammatory changes. Lymphatic: No significant vascular findings are present. No enlarged abdominal or pelvic lymph nodes. Reproductive: Uterus and bilateral adnexa are unremarkable. Other: No abdominal wall hernia or abnormality. No abdominopelvic ascites. Musculoskeletal: No acute or significant osseous findings. Right fourth lateral rib a bone island. Mild thoracic spine endplate degenerative changes of lower lumbar facet arthrosis. Review of the MIP images confirms  the above findings. IMPRESSION: 1. No acute finding as explanation for pain. 2. Normal aorta without evidence for aneurysm, dissection, vasculitis, or significant stenosis. 3. 6 mm hyperattenuating focus in liver segment 2, probably benign. In a low risk patient no additional follow-up is necessary. In a high risk patient follow-up with MRI in 3-6 months is recommended. This recommendation follows ACR consensus guidelines: Management of Incidental Liver Lesions on CT: A White Paper of the ACR Incidental Findings Committee. J Am Coll Radiol 2017; article currently in press. 4. Otherwise unremarkable CTA of chest abdomen and pelvis. Electronically Signed   By: Kristine Garbe M.D.   On: 02/15/2016 23:55     TODAY-DAY OF DISCHARGE:  Subjective:   Jessica Cruz today has mild headache,no chest abdominal pain,no new weakness tingling or numbness, feels much better wants to go home today.   Objective:   Blood pressure (!) 142/92, pulse 75, temperature 98 F (36.7 C), temperature source Oral, resp. rate 16, height 5\' 5"  (1.651 m), weight 105.1 kg (231 lb 11.3 oz), last menstrual period 01/15/2016, SpO2 97 %.  Intake/Output Summary (Last 24 hours) at 02/18/16 1231 Last data filed at 02/18/16 0932  Gross per 24 hour  Intake              120 ml  Output                0 ml  Net              120 ml   Filed Weights   02/15/16 2104 02/16/16 0420  Weight: 95.3 kg (210 lb) 105.1 kg (231 lb 11.3 oz)    Exam: Awake Alert, Oriented *3, No new F.N deficits, Normal affect [place].AT,PERRAL Supple Neck,No JVD, No cervical lymphadenopathy appriciated.  Symmetrical Chest wall movement, Good air movement bilaterally, CTAB RRR,No Gallops,Rubs or new Murmurs, No Parasternal Heave +ve B.Sounds, Abd Soft, Non tender, No organomegaly appriciated, No rebound -guarding or rigidity. No Cyanosis, Clubbing or edema, No new Rash or bruise   PERTINENT RADIOLOGIC STUDIES: Dg Chest Portable 1 View  Result Date:  02/15/2016 CLINICAL DATA:  Worsening chest pain and dyspnea for 1 day. EXAM: PORTABLE CHEST 1 VIEW COMPARISON:  01/12/2013 FINDINGS: A single AP portable view of the chest demonstrates no focal airspace consolidation or alveolar edema. The lungs are grossly clear. There is no large effusion or pneumothorax. Cardiac and mediastinal contours appear unremarkable. IMPRESSION: No active disease. Electronically Signed   By: Andreas Newport M.D.   On: 02/15/2016 21:38   Ct Angio Chest/abd/pel For Dissection W And/or Wo Contrast  Result Date: 02/15/2016 CLINICAL DATA:  50 y/o F; chest pain and left arm pain to the back and vomiting since this morning. EXAM: CT ANGIOGRAPHY CHEST, ABDOMEN AND PELVIS TECHNIQUE: Multidetector CT imaging through the chest, abdomen and pelvis was performed using the standard protocol during bolus administration of intravenous contrast. Multiplanar reconstructed images and MIPs were obtained and reviewed to  evaluate the vascular anatomy. CONTRAST:  100 cc Isovue 370 COMPARISON:  07/29/2013 CT of abdomen and pelvis. FINDINGS: CTA CHEST FINDINGS Cardiovascular: Preferential opacification of the thoracic aorta. No evidence of thoracic aortic aneurysm or dissection. Normal heart size. No pericardial effusion. Normal caliber main pulmonary artery. No central or lobar pulmonary embolus. Mediastinum/Nodes: No enlarged mediastinal, hilar, or axillary lymph nodes. Thyroid gland, trachea, and esophagus demonstrate no significant findings. Lungs/Pleura: Lungs are clear. No pleural effusion or pneumothorax. Musculoskeletal: No chest wall abnormality. No acute or significant osseous findings. Review of the MIP images confirms the above findings. CTA ABDOMEN AND PELVIS FINDINGS VASCULAR Aorta: Normal caliber aorta without aneurysm, dissection, vasculitis or significant stenosis. Celiac: Patent without evidence of aneurysm, dissection, vasculitis or significant stenosis. SMA: Patent without evidence of  aneurysm, dissection, vasculitis or significant stenosis. Renals: Both renal arteries are patent without evidence of aneurysm, dissection, vasculitis, fibromuscular dysplasia or significant stenosis. IMA: Patent without evidence of aneurysm, dissection, vasculitis or significant stenosis. Inflow: Patent without evidence of aneurysm, dissection, vasculitis or significant stenosis. Veins: No obvious venous abnormality within the limitations of this arterial phase study. Review of the MIP images confirms the above findings. NON-VASCULAR Hepatobiliary: 6 mm hyper attenuating focus in segment 2 (series 7, image 66). Otherwise no focal liver abnormality is seen. No gallstones, gallbladder wall thickening, or biliary dilatation. Pancreas: Unremarkable. No pancreatic ductal dilatation or surrounding inflammatory changes. Spleen: Normal in size without focal abnormality. Several small splenules anterior to the spleen the largest measuring 17 mm per Adrenals/Urinary Tract: Adrenal glands are unremarkable. Kidneys are normal, without renal calculi, focal lesion, or hydronephrosis. Bladder is unremarkable. Stomach/Bowel: Stomach is within normal limits. Appendix appears normal. No evidence of bowel wall thickening, distention, or inflammatory changes. Lymphatic: No significant vascular findings are present. No enlarged abdominal or pelvic lymph nodes. Reproductive: Uterus and bilateral adnexa are unremarkable. Other: No abdominal wall hernia or abnormality. No abdominopelvic ascites. Musculoskeletal: No acute or significant osseous findings. Right fourth lateral rib a bone island. Mild thoracic spine endplate degenerative changes of lower lumbar facet arthrosis. Review of the MIP images confirms the above findings. IMPRESSION: 1. No acute finding as explanation for pain. 2. Normal aorta without evidence for aneurysm, dissection, vasculitis, or significant stenosis. 3. 6 mm hyperattenuating focus in liver segment 2, probably  benign. In a low risk patient no additional follow-up is necessary. In a high risk patient follow-up with MRI in 3-6 months is recommended. This recommendation follows ACR consensus guidelines: Management of Incidental Liver Lesions on CT: A White Paper of the ACR Incidental Findings Committee. J Am Coll Radiol 2017; article currently in press. 4. Otherwise unremarkable CTA of chest abdomen and pelvis. Electronically Signed   By: Kristine Garbe M.D.   On: 02/15/2016 23:55     PERTINENT LAB RESULTS: CBC:  Recent Labs  02/17/16 0331 02/18/16 0241  WBC 6.8 8.5  HGB 7.8* 8.3*  HCT 28.9* 30.7*  PLT 260 287   CMET CMP     Component Value Date/Time   NA 137 02/17/2016 0331   K 3.7 02/17/2016 0331   CL 107 02/17/2016 0331   CO2 24 02/17/2016 0331   GLUCOSE 126 (H) 02/17/2016 0331   BUN 9 02/17/2016 0331   CREATININE 0.77 02/17/2016 0331   CALCIUM 8.4 (L) 02/17/2016 0331   PROT 7.3 02/15/2016 2111   ALBUMIN 3.4 (L) 02/15/2016 2111   AST 21 02/15/2016 2111   ALT 16 02/15/2016 2111   ALKPHOS 58 02/15/2016 2111  BILITOT 0.6 02/15/2016 2111   GFRNONAA >60 02/17/2016 0331   GFRAA >60 02/17/2016 0331    GFR Estimated Creatinine Clearance: 103.5 mL/min (by C-G formula based on SCr of 0.77 mg/dL). No results for input(s): LIPASE, AMYLASE in the last 72 hours.  Recent Labs  02/16/16 0543 02/16/16 1229 02/16/16 1805  TROPONINI 2.11* 3.43* 3.03*   Invalid input(s): POCBNP No results for input(s): DDIMER in the last 72 hours.  Recent Labs  02/16/16 0543  HGBA1C 6.2*    Recent Labs  02/15/16 2111 02/16/16 0543  CHOL 196 206*  HDL 33* 39*  LDLCALC 131* 152*  TRIG 158* 75  CHOLHDL 5.9 5.3   No results for input(s): TSH, T4TOTAL, T3FREE, THYROIDAB in the last 72 hours.  Invalid input(s): FREET3  Recent Labs  02/16/16 1005  VITAMINB12 637  FOLATE 13.4  FERRITIN 4*  TIBC 441  IRON 13*  RETICCTPCT 1.2   Coags:  Recent Labs  02/15/16 2111  INR 1.13    Microbiology: Recent Results (from the past 240 hour(s))  MRSA PCR Screening     Status: None   Collection Time: 02/16/16  4:13 AM  Result Value Ref Range Status   MRSA by PCR NEGATIVE NEGATIVE Final    Comment:        The GeneXpert MRSA Assay (FDA approved for NASAL specimens only), is one component of a comprehensive MRSA colonization surveillance program. It is not intended to diagnose MRSA infection nor to guide or monitor treatment for MRSA infections.     FURTHER DISCHARGE INSTRUCTIONS:  Get Medicines reviewed and adjusted: Please take all your medications with you for your next visit with your Primary MD  Laboratory/radiological data: Please request your Primary MD to go over all hospital tests and procedure/radiological results at the follow up, please ask your Primary MD to get all Hospital records sent to his/her office.  In some cases, they will be blood work, cultures and biopsy results pending at the time of your discharge. Please request that your primary care M.D. goes through all the records of your hospital data and follows up on these results.  Also Note the following: If you experience worsening of your admission symptoms, develop shortness of breath, life threatening emergency, suicidal or homicidal thoughts you must seek medical attention immediately by calling 911 or calling your MD immediately  if symptoms less severe.  You must read complete instructions/literature along with all the possible adverse reactions/side effects for all the Medicines you take and that have been prescribed to you. Take any new Medicines after you have completely understood and accpet all the possible adverse reactions/side effects.   Do not drive when taking Pain medications or sleeping medications (Benzodaizepines)  Do not take more than prescribed Pain, Sleep and Anxiety Medications. It is not advisable to combine anxiety,sleep and pain medications without talking with your  primary care practitioner  Special Instructions: If you have smoked or chewed Tobacco  in the last 2 yrs please stop smoking, stop any regular Alcohol  and or any Recreational drug use.  Wear Seat belts while driving.  Please note: You were cared for by a hospitalist during your hospital stay. Once you are discharged, your primary care physician will handle any further medical issues. Please note that NO REFILLS for any discharge medications will be authorized once you are discharged, as it is imperative that you return to your primary care physician (or establish a relationship with a primary care physician if you do not  have one) for your post hospital discharge needs so that they can reassess your need for medications and monitor your lab values.  Total Time spent coordinating discharge including counseling, education and face to face time equals 45 minutes.  SignedOren Binet 02/18/2016 12:31 PM

## 2016-02-24 ENCOUNTER — Encounter: Payer: Self-pay | Admitting: Physician Assistant

## 2016-02-24 DIAGNOSIS — I119 Hypertensive heart disease without heart failure: Secondary | ICD-10-CM | POA: Insufficient documentation

## 2016-02-24 DIAGNOSIS — R7303 Prediabetes: Secondary | ICD-10-CM | POA: Insufficient documentation

## 2016-02-24 DIAGNOSIS — E785 Hyperlipidemia, unspecified: Secondary | ICD-10-CM | POA: Insufficient documentation

## 2016-02-24 DIAGNOSIS — I10 Essential (primary) hypertension: Secondary | ICD-10-CM | POA: Insufficient documentation

## 2016-02-24 DIAGNOSIS — K7689 Other specified diseases of liver: Secondary | ICD-10-CM | POA: Insufficient documentation

## 2016-02-24 DIAGNOSIS — D509 Iron deficiency anemia, unspecified: Secondary | ICD-10-CM | POA: Insufficient documentation

## 2016-02-24 DIAGNOSIS — E669 Obesity, unspecified: Secondary | ICD-10-CM | POA: Insufficient documentation

## 2016-02-24 NOTE — Progress Notes (Signed)
Cardiology Office Note    Date:  02/25/2016  ID:  Jessica Cruz, DOB [birth year], MRN [id number] PCP:  No PCP Per Patient  Cardiologist:  Irish Lack   Chief Complaint: f/u cath  History of Present Illness:  Jessica Cruz is a 50 y.o. female with history of recent NSTEMI felt 2/2 demand ischemia (normal cors by cath 02/2016), obesity, HTN, HLD, pre-diabetes, microcytic anemia (suspected due to menorrhagia), h/o intracranial aneurysm rupture (clipped [facility] 2007), liver nodule who presents for post-hospital follow-up. In 01/2016 she was admitted with DOE, chest pain, accelerated HTN (BP >200/130), & elevated troponin. She had not seen a doctor for several years. CTA showed no evidence of dissection or acute finding to explain pain. She did have a liver nodule suspected benign with recommendation for f/u MRI in 3-6 months if high risk patient. 2D echo 02/17/16: mild LVH, EF 60-65%, grade 1 dd, mild LAE, trivial pericardial effusion. LHC 11/1/7: normal coronaries, very tortuous suggestive of prolonged HTN, normal LV function 55-65% with normal LVEDP, no evidence of renal artery stenosis. Labs notable for Hgb in the 7-8 range (rec'd 1 U PRBC), Cr 0.77, K 3.7, peak troponin 3.43, LDL 152 (started on statin), A1C 6.2.  She presents for post-hospital follow-up. Her chest discomfort has subsided gradually over the last few days. She still notices some DOE. BP remains elevated. She is planning to obtain a PCP. No problems with cath site.   Past Medical History:  Diagnosis Date  . Brain aneurysm    a. h/o intracranial aneurysm rupture (clipped [facility] 2007).  . Essential hypertension   . Fibroids   . Hyperlipidemia   . Hypertensive heart disease   . Liver nodule   . Menorrhagia   . Microcytic anemia   . Non-ST elevation myocardial infarction (NSTEMI), type 2 ([facility])    a. demand ischemia 01/2016 in setting of accelerated HTN, anemia - normal cors by cath.  . Obesity   . Pre-diabetes     Past Surgical  History:  Procedure Laterality Date  . CARDIAC CATHETERIZATION N/A 02/16/2016   Procedure: Left Heart Cath and Coronary Angiography;  Surgeon: Wellington Hampshire, MD;  Location: [facility];  Service: Cardiovascular;  Laterality: N/A;  . CEREBRAL ANEURYSM REPAIR      Current Medications: Current Outpatient Prescriptions  Medication Sig Dispense Refill  . acetaminophen (TYLENOL) 325 MG tablet Take 325-650 mg by mouth every 6 (six) hours as needed for headache.    [name] Kitchen aspirin EC 81 MG EC tablet Take 1 tablet (81 mg total) by mouth daily.    [name] Kitchen atorvastatin (LIPITOR) 40 MG tablet Take 1 tablet (40 mg total) by mouth daily at 6 PM. 30 tablet 0  . carvedilol (COREG) 12.5 MG tablet Take 1 tablet (12.5 mg total) by mouth 2 (two) times daily with a meal. 60 tablet 0  . ferrous sulfate 325 (65 FE) MG tablet Take 1 tablet (325 mg total) by mouth 2 (two) times daily with a meal. 90 tablet 0  . hydrochlorothiazide (MICROZIDE) 12.5 MG capsule Take 1 capsule (12.5 mg total) by mouth daily. 30 capsule 0  . lisinopril (PRINIVIL,ZESTRIL) 20 MG tablet Take 1 tablet (20 mg total) by mouth daily. 30 tablet 0   No current facility-administered medications for this visit.      Allergies:   Pineapple and Shellfish allergy   Social History   Social History  . Marital status: Married    Spouse name: N/A  . Number of children:  N/A  . Years of education: N/A   Social History Main Topics  . Smoking status: Never Smoker  . Smokeless tobacco: Never Used  . Alcohol use Yes     Comment: rare  . Drug use: No  . Sexual activity: Yes    Birth control/ protection: None   Other Topics Concern  . None   Social History Narrative  . None     Family History:  The patient's family history includes Diabetes in her mother; Heart attack in her paternal grandfather and paternal grandmother; Heart attack (age of onset: 32) in her father; Heart disease in her maternal grandmother, paternal grandfather, and paternal  grandmother; Heart failure in her paternal grandfather and paternal grandmother; Hypertension in her mother; Stroke in her father.   ROS:   Please see the history of present illness. Trace ankle edema occasionally All other systems are reviewed and otherwise negative.    PHYSICAL EXAM:   VS:  BP (!) 146/100   Pulse 74   Ht 5\' 5"  (1.651 m)   Wt 236 lb 12.8 oz (107.4 kg)   LMP 01/15/2016   BMI 39.41 kg/m   BMI: Body mass index is 39.41 kg/m. GEN: Well nourished, well developed AAF, in no acute distress  HEENT: normocephalic, atraumatic Neck: no JVD, carotid bruits, or masses Cardiac: RRR; no murmurs, rubs, or gallops, no edema  Respiratory:  clear to auscultation bilaterally, normal work of breathing GI: soft, nontender, nondistended, + BS MS: no deformity or atrophy  Skin: warm and dry, no rash, right radial cath site without hematoma or ecchymosis; good pulse. Neuro:  Alert and Oriented x 3, Strength and sensation are intact, follows commands Psych: euthymic mood, full affect  Wt Readings from Last 3 Encounters:  02/25/16 236 lb 12.8 oz (107.4 kg)  02/16/16 231 lb 11.3 oz (105.1 kg)  11/12/13 230 lb (104.3 kg)      Studies/Labs Reviewed:   EKG:  EKG was not ordered today.  Recent Labs: 02/15/2016: ALT 16 02/17/2016: BUN 9; Creatinine, Ser 0.77; Potassium 3.7; Sodium 137 02/18/2016: Hemoglobin 8.3; Platelets 287   Lipid Panel    Component Value Date/Time   CHOL 206 (H) 02/16/2016 0543   TRIG 75 02/16/2016 0543   HDL 39 (L) 02/16/2016 0543   CHOLHDL 5.3 02/16/2016 0543   VLDL 15 02/16/2016 0543   LDLCALC 152 (H) 02/16/2016 0543    Additional studies/ records that were reviewed today include: Summarized above.    ASSESSMENT & PLAN:   1. Type 2 NSTEMI - suspected demand ischemia in the setting of accelerated HTN and anemia. Continue risk reduction with control of BP and HLD. See below regarding med adjustments. 2. HTN - increase Coreg to 25mg  BID. Recheck BMET  today given recent initiation of ACEI and HCTZ. Will also add screening TSH given her high BP. 3. Hypertensive heart disease - titrate BB. Reviewed importance of limiting sodium and alcohol intake.  4. Anemia - recheck CBC today. If there's any signs of further downtrend will need to D/C ASA. Advised f/u with PCP and GYN as recommended. 5. Hyperlipidemia - statin recently started. Check lipids/CMET in 6 weeks.  Disposition: F/u with me in 10 days for BP recheck. Also reminded patient to f/u PCP for liver nodule, anemia, pre-DM.   Medication Adjustments/Labs and Tests Ordered: Current medicines are reviewed at length with the patient today.  Concerns regarding medicines are outlined above. Medication changes, Labs and Tests ordered today are summarized above and listed in  the Patient Instructions accessible in Encounters.   Raechel Ache PA-C  02/25/2016 8:45 AM    [address], [place], [place]  [address] Phone: [phone]; Fax: [phone]

## 2016-02-25 ENCOUNTER — Ambulatory Visit (INDEPENDENT_AMBULATORY_CARE_PROVIDER_SITE_OTHER): Payer: 59 | Admitting: Physician Assistant

## 2016-02-25 ENCOUNTER — Encounter: Payer: Self-pay | Admitting: Physician Assistant

## 2016-02-25 VITALS — BP 146/100 | HR 74 | Ht 65.0 in | Wt 236.8 lb

## 2016-02-25 DIAGNOSIS — I21A1 Myocardial infarction type 2: Secondary | ICD-10-CM

## 2016-02-25 DIAGNOSIS — I119 Hypertensive heart disease without heart failure: Secondary | ICD-10-CM | POA: Diagnosis not present

## 2016-02-25 DIAGNOSIS — I10 Essential (primary) hypertension: Secondary | ICD-10-CM

## 2016-02-25 DIAGNOSIS — I214 Non-ST elevation (NSTEMI) myocardial infarction: Secondary | ICD-10-CM

## 2016-02-25 DIAGNOSIS — D509 Iron deficiency anemia, unspecified: Secondary | ICD-10-CM

## 2016-02-25 DIAGNOSIS — K7689 Other specified diseases of liver: Secondary | ICD-10-CM

## 2016-02-25 DIAGNOSIS — R7303 Prediabetes: Secondary | ICD-10-CM

## 2016-02-25 DIAGNOSIS — E785 Hyperlipidemia, unspecified: Secondary | ICD-10-CM

## 2016-02-25 LAB — CBC
HCT: 34.9 % — ABNORMAL LOW (ref 35.0–45.0)
Hemoglobin: 9.7 g/dL — ABNORMAL LOW (ref 11.7–15.5)
MCH: 19.1 pg — ABNORMAL LOW (ref 27.0–33.0)
MCHC: 27.8 g/dL — ABNORMAL LOW (ref 32.0–36.0)
MCV: 68.6 fL — ABNORMAL LOW (ref 80.0–100.0)
Platelets: 311 10*3/uL (ref 140–400)
RBC: 5.09 MIL/uL (ref 3.80–5.10)
RDW: 29.2 % — ABNORMAL HIGH (ref 11.0–15.0)
WBC: 8.1 10*3/uL (ref 3.8–10.8)

## 2016-02-25 LAB — BASIC METABOLIC PANEL
BUN: 17 mg/dL (ref 7–25)
CO2: 22 mmol/L (ref 20–31)
Calcium: 8.6 mg/dL (ref 8.6–10.2)
Chloride: 109 mmol/L (ref 98–110)
Creat: 0.98 mg/dL (ref 0.50–1.10)
Glucose, Bld: 129 mg/dL — ABNORMAL HIGH (ref 65–99)
Potassium: 4.2 mmol/L (ref 3.5–5.3)
Sodium: 140 mmol/L (ref 135–146)

## 2016-02-25 LAB — TSH: TSH: 3.91 mIU/L

## 2016-02-25 MED ORDER — CARVEDILOL 25 MG PO TABS: 25.0000 mg | ORAL_TABLET | Freq: Two times a day (BID) | ORAL | 1 refills | Status: DC

## 2016-02-25 NOTE — Patient Instructions (Signed)
Medication Instructions:  Your physician has recommended you make the following change in your medication:  1.  INCREASE the Coreg to 25 mg taking 1 tablet twice a day ( you may take 2 tablets by mouth 2 times a day of the 12.5 mg to use what you currently have)   Labwork: TODAY:  BMET, CBC, & TSH 6 WEEKS:  FASTING LIPID & CMET  Testing/Procedures: None ordered  Follow-Up: Your physician recommends that you schedule a follow-up appointment in: [place], PA-C   Any Other Special Instructions Will Be Listed Below (If Applicable).   If you need a refill on your cardiac medications before your next appointment, please call your pharmacy.

## 2016-02-26 ENCOUNTER — Encounter (HOSPITAL_COMMUNITY): Payer: Self-pay | Admitting: Emergency Medicine

## 2016-02-26 ENCOUNTER — Emergency Department (HOSPITAL_COMMUNITY)
Admission: EM | Admit: 2016-02-26 | Discharge: 2016-02-27 | Disposition: A | Discharge status: Home / Self Care | Payer: 59 | Attending: Emergency Medicine | Admitting: Emergency Medicine

## 2016-02-26 ENCOUNTER — Emergency Department (HOSPITAL_COMMUNITY): Payer: 59

## 2016-02-26 DIAGNOSIS — I119 Hypertensive heart disease without heart failure: Secondary | ICD-10-CM | POA: Diagnosis not present

## 2016-02-26 DIAGNOSIS — I252 Old myocardial infarction: Secondary | ICD-10-CM | POA: Diagnosis not present

## 2016-02-26 DIAGNOSIS — R0602 Shortness of breath: Secondary | ICD-10-CM | POA: Insufficient documentation

## 2016-02-26 DIAGNOSIS — Z7982 Long term (current) use of aspirin: Secondary | ICD-10-CM | POA: Diagnosis not present

## 2016-02-26 DIAGNOSIS — Z955 Presence of coronary angioplasty implant and graft: Secondary | ICD-10-CM | POA: Diagnosis not present

## 2016-02-26 DIAGNOSIS — Z79899 Other long term (current) drug therapy: Secondary | ICD-10-CM | POA: Insufficient documentation

## 2016-02-26 LAB — BASIC METABOLIC PANEL
Anion gap: 9 (ref 5–15)
BUN: 14 mg/dL (ref 6–20)
CO2: 21 mmol/L — ABNORMAL LOW (ref 22–32)
Calcium: 9.1 mg/dL (ref 8.9–10.3)
Chloride: 107 mmol/L (ref 101–111)
Creatinine, Ser: 0.87 mg/dL (ref 0.44–1.00)
GFR calc Af Amer: >60 mL/min (ref >60)
GFR calc non Af Amer: >60 mL/min (ref >60)
Glucose, Bld: 92 mg/dL (ref 65–99)
Potassium: 3.9 mmol/L (ref 3.5–5.1)
Sodium: 137 mmol/L (ref 135–145)

## 2016-02-26 LAB — TROPONIN I: Troponin I: 0.12 ng/mL (ref <0.03)

## 2016-02-26 LAB — CBC WITH DIFFERENTIAL/PLATELET
Basophils Absolute: 0.1 10*3/uL (ref 0.0–0.1)
Basophils Relative: 1 %
Eosinophils Absolute: 0.2 10*3/uL (ref 0.0–0.7)
Eosinophils Relative: 2 %
HCT: 36.8 % (ref 36.0–46.0)
Hemoglobin: 10.6 g/dL — ABNORMAL LOW (ref 12.0–15.0)
Lymphocytes Relative: 38 %
Lymphs Abs: 3.4 10*3/uL (ref 0.7–4.0)
MCH: 19.8 pg — ABNORMAL LOW (ref 26.0–34.0)
MCHC: 28.8 g/dL — ABNORMAL LOW (ref 30.0–36.0)
MCV: 68.7 fL — ABNORMAL LOW (ref 78.0–100.0)
Monocytes Absolute: 0.4 10*3/uL (ref 0.1–1.0)
Monocytes Relative: 5 %
Neutro Abs: 4.8 10*3/uL (ref 1.7–7.7)
Neutrophils Relative %: 54 %
Platelets: 273 10*3/uL (ref 150–400)
RBC: 5.36 MIL/uL — ABNORMAL HIGH (ref 3.87–5.11)
RDW: 30.1 % — ABNORMAL HIGH (ref 11.5–15.5)
WBC: 8.9 10*3/uL (ref 4.0–10.5)

## 2016-02-26 MED ORDER — LORAZEPAM 1 MG PO TABS
0.5000 mg | ORAL_TABLET | Freq: Once | ORAL | Status: AC
  Administered 2016-02-26: 0.5 mg via ORAL
  Filled 2016-02-26: qty 1

## 2016-02-26 NOTE — ED Triage Notes (Signed)
Feeling SOB and like "my heart is in my throat" all day. States usually when she feels this way it comes and goes; today it has been all day long. Speaking in full sentences. Denies pain.

## 2016-02-26 NOTE — ED Provider Notes (Signed)
[facility] DEPT Provider Note   CSN: [id number] Arrival date & time: 02/26/16  1907    History   Chief Complaint Chief Complaint  Patient presents with  . Shortness of Breath    HPI Jessica Cruz is a 50 y.o. female.  50 year old female with a history of brain aneurysm, essential hypertension, fibroids, dyslipidemia, and microcytic anemia presents to the emergency department for evaluation of shortness of breath. She reports noticing her symptoms this morning at approximately 10:30 AM. Symptoms worsened while at work and have persisted throughout the day. She states that it feels like "my heart is in my throat". She states that she has felt anxious lately, but denies a history of anxiety. She denies taking any medications for symptoms. She has been compliant with all of her recommended antihypertensive medicines. She denies associated fever, nausea, vomiting, lightheadedness, syncope, and chest pain. She followed up with a cardiologist yesterday as she was recently admitted on 02/16/2016 for NSTEMI. Patient with clean cardiac catheterization during this admission. No evidence of aortic dissection on CT scan. LVEF 60-65% on echocardiogram. She was transfused with PRBCs over concern for demand ischemia from anemia.      Past Medical History:  Diagnosis Date  . Brain aneurysm    a. h/o intracranial aneurysm rupture (clipped [facility] 2007).  . Essential hypertension   . Fibroids   . Hyperlipidemia   . Hypertensive heart disease   . Liver nodule   . Menorrhagia   . Microcytic anemia   . Non-ST elevation myocardial infarction (NSTEMI), type 2 ([facility])    a. demand ischemia 01/2016 in setting of accelerated HTN, anemia - normal cors by cath.  . Obesity   . Pre-diabetes     Patient Active Problem List   Diagnosis Date Noted  . Pre-diabetes   . Obesity   . Hypertensive heart disease   . Microcytic anemia   . Liver nodule   . Essential hypertension   . Hyperlipidemia   . Iron  deficiency anemia due to chronic blood loss   . ACS (acute coronary syndrome) ([facility]) 02/16/2016  . Hypertension 02/16/2016  . Hypertensive emergency   . Hypoxemia   . Chest pain   . NSTEMI (non-ST elevated myocardial infarction) [facility])     Past Surgical History:  Procedure Laterality Date  . CARDIAC CATHETERIZATION N/A 02/16/2016   Procedure: Left Heart Cath and Coronary Angiography;  Surgeon: Wellington Hampshire, MD;  Location: [facility];  Service: Cardiovascular;  Laterality: N/A;  . CEREBRAL ANEURYSM REPAIR      OB History    No data available       Home Medications    Prior to Admission medications   Medication Sig Start Date End Date Taking? Authorizing Provider  acetaminophen (TYLENOL) 325 MG tablet Take 325-650 mg by mouth every 6 (six) hours as needed for headache.    Historical Provider, MD  aspirin EC 81 MG EC tablet Take 1 tablet (81 mg total) by mouth daily. 02/19/16   Shanker Kristeen Mans, MD  atorvastatin (LIPITOR) 40 MG tablet Take 1 tablet (40 mg total) by mouth daily at 6 PM. 02/18/16   Jonetta Osgood, MD  carvedilol (COREG) 25 MG tablet Take 1 tablet (25 mg total) by mouth 2 (two) times daily with a meal. 02/25/16   Dayna N Dunn, PA-C  ferrous sulfate 325 (65 FE) MG tablet Take 1 tablet (325 mg total) by mouth 2 (two) times daily with a meal. 02/18/16   Shanker  Kristeen Mans, MD  hydrochlorothiazide (MICROZIDE) 12.5 MG capsule Take 1 capsule (12.5 mg total) by mouth daily. 02/19/16   Shanker Kristeen Mans, MD  lisinopril (PRINIVIL,ZESTRIL) 20 MG tablet Take 1 tablet (20 mg total) by mouth daily. 02/19/16   Shanker Kristeen Mans, MD    Family History Family History  Problem Relation Age of Onset  . Heart attack Father 5  . Stroke Father   . Diabetes Mother   . Hypertension Mother   . Heart disease Maternal Grandmother   . Heart attack Paternal Grandmother   . Heart disease Paternal Grandmother   . Heart failure Paternal Grandmother   . Heart failure Paternal  Grandfather   . Heart disease Paternal Grandfather   . Heart attack Paternal Grandfather     Social History Social History  Substance Use Topics  . Smoking status: Never Smoker  . Smokeless tobacco: Never Used  . Alcohol use Yes     Comment: rare     Allergies   Pineapple and Shellfish allergy   Review of Systems Review of Systems Ten systems reviewed and are negative for acute change, except as noted in the HPI.    Physical Exam Updated Vital Signs BP (!) 152/104   Pulse 74   Temp 98.3 F (36.8 C) (Oral)   Resp 14   Ht 5\' 6"  (1.676 m)   Wt 105 kg   LMP 01/30/2016 (Approximate)   SpO2 99%   BMI 37.37 kg/m   Physical Exam  Constitutional: She is oriented to person, place, and time. She appears well-developed and well-nourished. No distress.  Nontoxic appearing and in NAD  HENT:  Head: Normocephalic and atraumatic.  Eyes: Conjunctivae and EOM are normal. No scleral icterus.  Neck: Normal range of motion.  No JVD  Cardiovascular: Normal rate and intact distal pulses.   Irregular rhythm, likely secondary to PVCs on monitor.  Pulmonary/Chest: Effort normal. No respiratory distress.  Musculoskeletal: Normal range of motion.  Neurological: She is alert and oriented to person, place, and time. She exhibits normal muscle tone. Coordination normal.  Skin: Skin is warm and dry. No rash noted. She is not diaphoretic. No erythema. No pallor.  Psychiatric: Thought content normal.  Mildly anxious appearing  Nursing note and vitals reviewed.    ED Treatments / Results  Labs (all labs ordered are listed, but only abnormal results are displayed) Labs Reviewed  CBC WITH DIFFERENTIAL/PLATELET - Abnormal; Notable for the following:       Result Value   RBC 5.36 (*)    Hemoglobin 10.6 (*)    MCV 68.7 (*)    MCH 19.8 (*)    MCHC 28.8 (*)    RDW 30.1 (*)    All other components within normal limits  TROPONIN I - Abnormal; Notable for the following:    Troponin I 0.12  (*)    All other components within normal limits  BASIC METABOLIC PANEL - Abnormal; Notable for the following:    CO2 21 (*)    All other components within normal limits    EKG  EKG Interpretation  Date/Time:  Saturday February 26 2016 19:18:08 EST Ventricular Rate:  78 PR Interval:  138 QRS Duration: 78 QT Interval:  392 QTC Calculation: 446 R Axis:   -16 Text Interpretation:  Sinus rhythm with frequent Premature ventricular complexes Otherwise normal ECG Confirmed by Dina Rich  MD, Loma Sousa ([id number]) on 02/27/2016 12:40:06 AM       Radiology Dg Chest 2 View  Result Date:  02/26/2016 CLINICAL DATA:  Shortness of breath today. Chest pain. History of hypertension EXAM: CHEST  2 VIEW COMPARISON:  02/15/2016 FINDINGS: Normal heart size and pulmonary vascularity. No focal airspace disease or consolidation in the lungs. No blunting of costophrenic angles. No pneumothorax. Mediastinal contours appear intact. Mild curvature of the thoracic spine. Degenerative changes in the spine. IMPRESSION: No active cardiopulmonary disease. Electronically Signed   By: Lucienne Capers M.D.   On: 02/26/2016 21:11    Procedures Procedures (including critical care time)  Medications Ordered in ED Medications  LORazepam (ATIVAN) tablet 0.5 mg (0.5 mg Oral Given 02/26/16 2114)     Initial Impression / Assessment and Plan / ED Course  I have reviewed the triage vital signs and the nursing notes.  Pertinent labs & imaging results that were available during my care of the patient were reviewed by me and considered in my medical decision making (see chart for details).  Clinical Course     9:48 PM Patient with elevated troponin of 0.12. This is trending down from the patient's admission. Plan for 3 hour troponin level to ensure stability. Patient with recent heart cath with clean coronaries; echo with LVEF of 60-65%. Remainder of workup today is reassuring. No anemia. Case discussed with cardiology, Dr.  Jeannine Boga, who agrees with outpatient management if patient's delta troponin level is stable.  12:42 AM Troponin stable. Patient rechecked. She is in no distress. No tachypnea, dyspnea, or hypoxia. Will continue with outpatient PCP and cardiology follow up. Return precautions discussed and provided. Patient discharged in stable condition with no unaddressed concerns.   Final Clinical Impressions(s) / ED Diagnoses   Final diagnoses:  SOB (shortness of breath)    New Prescriptions New Prescriptions   LORAZEPAM (ATIVAN) 1 MG TABLET    Take 0.5-1 tablets (0.5-1 mg total) by mouth 3 (three) times daily as needed for anxiety.     Antonietta Breach, PA-C 02/27/16 0045    Leonard Schwartz, MD 02/27/16 [phone]

## 2016-02-26 NOTE — ED Notes (Signed)
Pt advised blood draw is at 2340.

## 2016-02-26 NOTE — ED Notes (Signed)
Pt transported to xray 

## 2016-02-26 NOTE — ED Notes (Signed)
MD Audie Pinto made aware of trop. .12

## 2016-02-26 NOTE — ED Notes (Signed)
ED Provider at bedside. 

## 2016-02-26 NOTE — ED Notes (Signed)
Pt refused IV.  Per Antonietta Breach PA, no IV at this time.  Advised pt that per PA the plan is to recheck Troponin in 3 hours per cardiology.  Pt advised she is not staying 3 hours and wants to leave.  PA advised and in to speak with pt.

## 2016-02-27 LAB — TROPONIN I: Troponin I: 0.12 ng/mL (ref <0.03)

## 2016-02-27 MED ORDER — LORAZEPAM 1 MG PO TABS: 0.5000 mg | ORAL_TABLET | Freq: Three times a day (TID) | ORAL | 0 refills | Status: DC | PRN

## 2016-02-27 NOTE — Discharge Instructions (Signed)
We believe that your shortness of breath may be due to anxiety. Your heart enzyme is trending down compared to your admission 1 week ago. It can take a week or two for this value to go back to normal. Your heart enzyme did not increase over your ED stay today. Follow up with your primary care doctor and cardiologist as instructed. You may return for new or concerning symptoms.

## 2016-03-06 ENCOUNTER — Telehealth: Payer: Self-pay | Admitting: Physician Assistant

## 2016-03-06 NOTE — Telephone Encounter (Signed)
New message ° ° ° ° °Returning a call to the nurse °

## 2016-03-06 NOTE — Telephone Encounter (Signed)
Returned pts call and discussed lab results and recommendations. Pt verbalized understanding.

## 2016-07-05 ENCOUNTER — Telehealth: Payer: Self-pay

## 2016-07-05 NOTE — Telephone Encounter (Signed)
Melina Copa, PA seen patient on 02/25/16. Dr. Irish Lack listed as cardiologist, but has not seen the patient.  Request for surgical clearance:  1. What type of surgery is being performed? EGD and colonoscopy   2. When is this surgery scheduled? 08/10/16   3. Are there any medications that need to be held prior to surgery and how long?   4. Name of physician performing surgery? Dr. Collene Mares, Dr. Benson Norway   5. What is your office phone and fax number? Phone-[phone], Fax- [phone]

## 2016-07-05 NOTE — Telephone Encounter (Signed)
Dr. Irish Lack saw this patient in the hospital so it is his patient.

## 2016-07-06 NOTE — Telephone Encounter (Signed)
OK to hold aspirin 7 days prior to endoscopy.

## 2016-07-07 NOTE — Telephone Encounter (Signed)
Clearance faxed and confirmation received.

## 2016-08-23 ENCOUNTER — Telehealth: Payer: Self-pay

## 2016-08-23 ENCOUNTER — Ambulatory Visit: Payer: 59 | Admitting: Neurology

## 2016-08-23 NOTE — Telephone Encounter (Signed)
Pt did not show for their appt with Dr. Dohmeier today.  

## 2016-08-24 ENCOUNTER — Encounter: Payer: Self-pay | Admitting: Neurology

## 2016-10-08 ENCOUNTER — Encounter (HOSPITAL_COMMUNITY): Payer: Self-pay | Admitting: *Deleted

## 2016-10-08 ENCOUNTER — Observation Stay (HOSPITAL_COMMUNITY): Payer: 59

## 2016-10-08 ENCOUNTER — Observation Stay (HOSPITAL_COMMUNITY)
Admission: AD | Admit: 2016-10-08 | Discharge: 2016-10-09 | Disposition: A | Discharge status: Home / Self Care | Payer: 59 | Source: Other Acute Inpatient Hospital | Attending: Internal Medicine | Admitting: Internal Medicine

## 2016-10-08 DIAGNOSIS — R4701 Aphasia: Secondary | ICD-10-CM | POA: Diagnosis present

## 2016-10-08 DIAGNOSIS — E785 Hyperlipidemia, unspecified: Secondary | ICD-10-CM | POA: Diagnosis not present

## 2016-10-08 DIAGNOSIS — R0789 Other chest pain: Secondary | ICD-10-CM | POA: Diagnosis not present

## 2016-10-08 DIAGNOSIS — Z79899 Other long term (current) drug therapy: Secondary | ICD-10-CM | POA: Insufficient documentation

## 2016-10-08 DIAGNOSIS — R7303 Prediabetes: Secondary | ICD-10-CM | POA: Insufficient documentation

## 2016-10-08 DIAGNOSIS — E7800 Pure hypercholesterolemia, unspecified: Secondary | ICD-10-CM | POA: Diagnosis not present

## 2016-10-08 DIAGNOSIS — Z8249 Family history of ischemic heart disease and other diseases of the circulatory system: Secondary | ICD-10-CM | POA: Diagnosis not present

## 2016-10-08 DIAGNOSIS — I252 Old myocardial infarction: Secondary | ICD-10-CM | POA: Insufficient documentation

## 2016-10-08 DIAGNOSIS — Z6839 Body mass index (BMI) 39.0-39.9, adult: Secondary | ICD-10-CM | POA: Diagnosis not present

## 2016-10-08 DIAGNOSIS — G459 Transient cerebral ischemic attack, unspecified: Secondary | ICD-10-CM | POA: Diagnosis not present

## 2016-10-08 DIAGNOSIS — R2990 Unspecified symptoms and signs involving the nervous system: Secondary | ICD-10-CM

## 2016-10-08 DIAGNOSIS — G8191 Hemiplegia, unspecified affecting right dominant side: Secondary | ICD-10-CM

## 2016-10-08 DIAGNOSIS — Z9889 Other specified postprocedural states: Secondary | ICD-10-CM

## 2016-10-08 DIAGNOSIS — I10 Essential (primary) hypertension: Secondary | ICD-10-CM | POA: Diagnosis not present

## 2016-10-08 DIAGNOSIS — D509 Iron deficiency anemia, unspecified: Secondary | ICD-10-CM | POA: Diagnosis not present

## 2016-10-08 DIAGNOSIS — R519 Headache, unspecified: Secondary | ICD-10-CM

## 2016-10-08 DIAGNOSIS — Z8679 Personal history of other diseases of the circulatory system: Secondary | ICD-10-CM

## 2016-10-08 DIAGNOSIS — I119 Hypertensive heart disease without heart failure: Secondary | ICD-10-CM | POA: Insufficient documentation

## 2016-10-08 DIAGNOSIS — R51 Headache: Secondary | ICD-10-CM | POA: Diagnosis not present

## 2016-10-08 DIAGNOSIS — G8190 Hemiplegia, unspecified affecting unspecified side: Secondary | ICD-10-CM | POA: Diagnosis not present

## 2016-10-08 DIAGNOSIS — I160 Hypertensive urgency: Secondary | ICD-10-CM | POA: Diagnosis present

## 2016-10-08 DIAGNOSIS — Z7982 Long term (current) use of aspirin: Secondary | ICD-10-CM | POA: Insufficient documentation

## 2016-10-08 LAB — COMPREHENSIVE METABOLIC PANEL
ALT: 18 U/L (ref 14–54)
AST: 20 U/L (ref 15–41)
Albumin: 3.4 g/dL — ABNORMAL LOW (ref 3.5–5.0)
Alkaline Phosphatase: 55 U/L (ref 38–126)
Anion gap: 9 (ref 5–15)
BUN: 11 mg/dL (ref 6–20)
CO2: 20 mmol/L — ABNORMAL LOW (ref 22–32)
Calcium: 8.9 mg/dL (ref 8.9–10.3)
Chloride: 108 mmol/L (ref 101–111)
Creatinine, Ser: 0.86 mg/dL (ref 0.44–1.00)
GFR calc Af Amer: >60 mL/min (ref >60)
GFR calc non Af Amer: >60 mL/min (ref >60)
Glucose, Bld: 115 mg/dL — ABNORMAL HIGH (ref 65–99)
Potassium: 3.5 mmol/L (ref 3.5–5.1)
Sodium: 137 mmol/L (ref 135–145)
Total Bilirubin: 0.7 mg/dL (ref 0.3–1.2)
Total Protein: 7.1 g/dL (ref 6.5–8.1)

## 2016-10-08 LAB — CBC
HCT: 38.4 % (ref 36.0–46.0)
Hemoglobin: 11.6 g/dL — ABNORMAL LOW (ref 12.0–15.0)
MCH: 23.2 pg — ABNORMAL LOW (ref 26.0–34.0)
MCHC: 30.2 g/dL (ref 30.0–36.0)
MCV: 76.6 fL — ABNORMAL LOW (ref 78.0–100.0)
Platelets: 234 10*3/uL (ref 150–400)
RBC: 5.01 MIL/uL (ref 3.87–5.11)
RDW: 17.9 % — ABNORMAL HIGH (ref 11.5–15.5)
WBC: 7.5 10*3/uL (ref 4.0–10.5)

## 2016-10-08 LAB — TROPONIN I
Troponin I: 0.08 ng/mL (ref <0.03)
Troponin I: 0.08 ng/mL (ref <0.03)
Troponin I: 0.08 ng/mL (ref <0.03)

## 2016-10-08 LAB — LIPID PANEL
Cholesterol: 255 mg/dL — ABNORMAL HIGH (ref 0–200)
HDL: 36 mg/dL — ABNORMAL LOW (ref >40)
LDL Cholesterol: 170 mg/dL — ABNORMAL HIGH (ref 0–99)
Total CHOL/HDL Ratio: 7.1 RATIO
Triglycerides: 244 mg/dL — ABNORMAL HIGH (ref <150)
VLDL: 49 mg/dL — ABNORMAL HIGH (ref 0–40)

## 2016-10-08 MED ORDER — ATORVASTATIN CALCIUM 80 MG PO TABS
80.0000 mg | ORAL_TABLET | Freq: Every day | ORAL | Status: DC
  Administered 2016-10-08: 80 mg via ORAL
  Filled 2016-10-08: qty 1

## 2016-10-08 MED ORDER — HYDRALAZINE HCL 50 MG PO TABS
50.0000 mg | ORAL_TABLET | Freq: Three times a day (TID) | ORAL | Status: DC
  Administered 2016-10-08 – 2016-10-09 (×3): 50 mg via ORAL
  Filled 2016-10-08 (×3): qty 1

## 2016-10-08 MED ORDER — ASPIRIN 325 MG PO TABS
325.0000 mg | ORAL_TABLET | Freq: Every day | ORAL | Status: DC
  Administered 2016-10-08 – 2016-10-09 (×2): 325 mg via ORAL
  Filled 2016-10-08 (×2): qty 1

## 2016-10-08 MED ORDER — HYDRALAZINE HCL 20 MG/ML IJ SOLN: 10.0000 mg | INTRAMUSCULAR | Status: DC | PRN

## 2016-10-08 MED ORDER — ASPIRIN 300 MG RE SUPP: 300.0000 mg | Freq: Every day | RECTAL | Status: DC

## 2016-10-08 MED ORDER — ACETAMINOPHEN 325 MG PO TABS
650.0000 mg | ORAL_TABLET | ORAL | Status: DC | PRN
  Administered 2016-10-08 (×2): 650 mg via ORAL
  Filled 2016-10-08 (×2): qty 2

## 2016-10-08 MED ORDER — ATORVASTATIN CALCIUM 40 MG PO TABS: 40.0000 mg | ORAL_TABLET | Freq: Every day | ORAL | Status: DC

## 2016-10-08 MED ORDER — ACETAMINOPHEN 650 MG RE SUPP: 650.0000 mg | RECTAL | Status: DC | PRN

## 2016-10-08 MED ORDER — LISINOPRIL 20 MG PO TABS
20.0000 mg | ORAL_TABLET | Freq: Every day | ORAL | Status: DC
  Administered 2016-10-08 – 2016-10-09 (×2): 20 mg via ORAL
  Filled 2016-10-08 (×2): qty 1

## 2016-10-08 MED ORDER — CARVEDILOL 12.5 MG PO TABS
25.0000 mg | ORAL_TABLET | Freq: Two times a day (BID) | ORAL | Status: DC
  Administered 2016-10-08 – 2016-10-09 (×3): 25 mg via ORAL
  Filled 2016-10-08 (×3): qty 2

## 2016-10-08 MED ORDER — ENOXAPARIN SODIUM 40 MG/0.4ML ~~LOC~~ SOLN
40.0000 mg | SUBCUTANEOUS | Status: DC
  Administered 2016-10-08: 40 mg via SUBCUTANEOUS
  Filled 2016-10-08: qty 0.4

## 2016-10-08 MED ORDER — HYDROCHLOROTHIAZIDE 12.5 MG PO CAPS
12.5000 mg | ORAL_CAPSULE | Freq: Every day | ORAL | Status: DC
  Administered 2016-10-08 – 2016-10-09 (×2): 12.5 mg via ORAL
  Filled 2016-10-08 (×2): qty 1

## 2016-10-08 MED ORDER — LISINOPRIL 20 MG PO TABS: 20.0000 mg | ORAL_TABLET | Freq: Every day | ORAL | Status: DC

## 2016-10-08 MED ORDER — ACETAMINOPHEN 160 MG/5ML PO SOLN: 650.0000 mg | ORAL | Status: DC | PRN

## 2016-10-08 MED ORDER — IOPAMIDOL (ISOVUE-370) INJECTION 76%
INTRAVENOUS | Status: AC
  Administered 2016-10-08: 50 mL
  Filled 2016-10-08: qty 50

## 2016-10-08 MED ORDER — FERROUS SULFATE 325 (65 FE) MG PO TABS
325.0000 mg | ORAL_TABLET | Freq: Two times a day (BID) | ORAL | Status: DC
  Administered 2016-10-08 – 2016-10-09 (×3): 325 mg via ORAL
  Filled 2016-10-08 (×3): qty 1

## 2016-10-08 MED ORDER — STROKE: EARLY STAGES OF RECOVERY BOOK
Freq: Once | Status: AC
  Administered 2016-10-08: 04:00:00

## 2016-10-08 NOTE — Progress Notes (Signed)
@IPLOG @        PROGRESS NOTE                                                                                                                                                                                                             Patient Demographics:    Jessica Cruz, is a 51 y.o. female, DOB - [birth year], QIO:[id number]  Admit date - 10/08/2016   Admitting Physician Norval Morton, MD  Outpatient Primary MD for the patient is Patient, No Pcp Per  LOS - 0  No chief complaint on file.      Brief Narrative  Jessica Cruz is a 51 y.o. female with medical history significant of HTN, HLD, NSTEMI, prediabetes, headaches, brain aneurysm s/p clipping in 2007; who presents with complaints of inability to talk or move her right side, this happened in home and lasted for about 10 minutes. She presented initially to [facility] ER where she was found to have high blood pressure, CT head overall was nonacute. She was then transferred to [facility] for further neurological workup.   Subjective:    Jessica Cruz today has, No headache, No chest pain, No abdominal pain - No Nausea, No new weakness tingling or numbness, No Cough - SOB.     Assessment  & Plan :     1.Expressive aphasia and right-sided hemiparesis lasting 10 minutes. Per neurology differential includes TIA, hypertensive urgency versus complex migraine. Symptoms have all completely resolved, have adjusted blood pressure medications for better control, have increased Lipitor to max dose as LDL was above goal at 170, A1c is pending, full stroke workup underway and neurology following.  2. Mildly elevated isolated single troponin level. Trent, chest pain-free, EKG nonacute, echo pending to evaluate wall motion and EF, currently on aspirin, statin and beta blocker for secondary prevention.  3. Dyslipidemia. Statin dose maximized to bring LDL to goal.  4. History of brain aneurysm status post clipping in 2007. Supportive care.  5.  Morbid obesity. Follow with PCP for weight loss.  6. Hypertensive urgency. Have added hydralazine to high-dose Coreg, HCTZ and ACE inhibitor for better control we will monitor.  Lab Results  Component Value Date   HGBA1C 6.2 (H) 02/16/2016     Diet : Diet Heart Room service appropriate? Yes; Fluid consistency: Thin    Family Communication  :  Husband  Code Status :  Full  Disposition Plan  :  Home 1-2 days  Consults  :  Neuro  Procedures  :  CT angiogram head and neck  MRI brain if patient's brain hardware is compatible.  Echocardiogram  DVT Prophylaxis  :  Lovenox   Lab Results  Component Value Date   PLT 234 10/08/2016    Inpatient Medications  Scheduled Meds: . aspirin  325 mg Oral Daily  . atorvastatin  40 mg Oral q1800  . carvedilol  25 mg Oral BID WC  . enoxaparin (LOVENOX) injection  40 mg Subcutaneous Q24H  . ferrous sulfate  325 mg Oral BID WC  . hydrALAZINE  50 mg Oral Q8H  . hydrochlorothiazide  12.5 mg Oral Daily  . lisinopril  20 mg Oral Daily   Continuous Infusions: PRN Meds:.acetaminophen **OR** [DISCONTINUED] acetaminophen (TYLENOL) oral liquid 160 mg/5 mL **OR** [DISCONTINUED] acetaminophen, hydrALAZINE  Antibiotics  :    Anti-infectives    None         Objective:   Vitals:   10/08/16 0245 10/08/16 0420 10/08/16 0620  BP: (!) 196/109 (!) 174/100 (!) 152/59  Pulse: 71 70 66  Resp: 16 18 16   Temp: 99.1 F (37.3 C) 98.8 F (37.1 C)   TempSrc: Oral Oral   SpO2: 98% 96% 98%    Wt Readings from Last 3 Encounters:  02/26/16 105 kg (231 lb 8 oz)  02/25/16 107.4 kg (236 lb 12.8 oz)  02/16/16 105.1 kg (231 lb 11.3 oz)    No intake or output data in the 24 hours ending 10/08/16 1028   Physical Exam  Awake Alert, Oriented X 3, No new F.N deficits, Normal affect [place].AT,PERRAL Supple Neck,No JVD, No cervical lymphadenopathy appriciated.  Symmetrical Chest wall movement, Good air movement bilaterally, CTAB RRR,No Gallops,Rubs  or new Murmurs, No Parasternal Heave +ve B.Sounds, Abd Soft, No tenderness, No organomegaly appriciated, No rebound - guarding or rigidity. No Cyanosis, Clubbing or edema, No new Rash or bruise        Data Review:    CBC  Recent Labs Lab 10/08/16 0403  WBC 7.5  HGB 11.6*  HCT 38.4  PLT 234  MCV 76.6*  MCH 23.2*  MCHC 30.2  RDW 17.9*    Chemistries   Recent Labs Lab 10/08/16 0403  NA 137  K 3.5  CL 108  CO2 20*  GLUCOSE 115*  BUN 11  CREATININE 0.86  CALCIUM 8.9  AST 20  ALT 18  ALKPHOS 55  BILITOT 0.7   ------------------------------------------------------------------------------------------------------------------  Recent Labs  10/08/16 0403  CHOL 255*  HDL 36*  LDLCALC 170*  TRIG 244*  CHOLHDL 7.1    Lab Results  Component Value Date   HGBA1C 6.2 (H) 02/16/2016   ------------------------------------------------------------------------------------------------------------------ No results for input(s): TSH, T4TOTAL, T3FREE, THYROIDAB in the last 72 hours.  Invalid input(s): FREET3 ------------------------------------------------------------------------------------------------------------------ No results for input(s): VITAMINB12, FOLATE, FERRITIN, TIBC, IRON, RETICCTPCT in the last 72 hours.  Coagulation profile No results for input(s): INR, PROTIME in the last 168 hours.  No results for input(s): DDIMER in the last 72 hours.  Cardiac Enzymes  Recent Labs Lab 10/08/16 0414  TROPONINI 0.08*   ------------------------------------------------------------------------------------------------------------------ No results found for: BNP  Micro Results No results found for this or any previous visit (from the past 240 hour(s)).  Radiology Reports No results found.  Time Spent in minutes  30   Lala Lund M.D on 10/08/2016 at 10:28 AM  Between 7am to 7pm - Pager - [phone] ( page via [email or web address], text pages only, please mention  full 10 digit call back number). After 7pm go to [email or web address] - password  [email or web address]

## 2016-10-08 NOTE — Evaluation (Signed)
Physical Therapy Evaluation Patient Details Name: Jessica Cruz MRN: [id number] DOB: 1966/04/02 Today's Date: 10/08/2016   History of Present Illness  Pt is a 51 y.o. female who presented to the ED with inability to talk or move R side for approximately 10 minutes. She has a PMH significant for HTN, HLD, NSTEMI, prediabetes, hedaches, brain aneurysm s/p clipping in 2007. Imaging pending.   Clinical Impression  Patient functioning at Mod I to supervision level with mobility and gait.  Good balance with gait.  No further acute PT needs identified - PT will sign off.    Follow Up Recommendations No PT follow up    Equipment Recommendations  None recommended by PT    Recommendations for Other Services       Precautions / Restrictions Precautions Precautions: None Precaution Comments: Hypertensive urgency Restrictions Weight Bearing Restrictions: No      Mobility  Bed Mobility Overal bed mobility: Modified Independent             General bed mobility comments: Increased time  Transfers Overall transfer level: Modified independent Equipment used: None             General transfer comment: Increased time  Ambulation/Gait Ambulation/Gait assistance: Supervision Ambulation Distance (Feet): 200 Feet Assistive device: None Gait Pattern/deviations: Step-through pattern;Decreased stride length Gait velocity: decreased Gait velocity interpretation: Below normal speed for age/gender General Gait Details: Patient with good gait pattern and balance.  Decreased gait speed.  Stairs Stairs: Yes Stairs assistance: Supervision Stair Management: No rails;Alternating pattern;Step to pattern Number of Stairs: 4 General stair comments: Patient used alternating technique to ascend stairs, and step-to pattern to descend stairs.  Wheelchair Mobility    Modified Rankin (Stroke Patients Only) Modified Rankin (Stroke Patients Only) Pre-Morbid Rankin Score: No symptoms Modified  Rankin: No significant disability (Patient reports some vision issues)     Balance           Standing balance support: No upper extremity supported Standing balance-Leahy Scale: Good               High level balance activites: Direction changes;Turns;Sudden stops;Head turns (stepping over obstacles) High Level Balance Comments: No loss of balance during high level balance activities.             Pertinent Vitals/Pain Pain Assessment: 0-10 Pain Score: 5  Pain Location: headache Pain Descriptors / Indicators: Headache Pain Intervention(s): Monitored during session;Patient requesting pain meds-RN notified    Home Living Family/patient expects to be discharged to:: Private residence Living Arrangements: Spouse/significant other Available Help at Discharge: Family;Available PRN/intermittently (Husband works during day) Type of Home: House Home Access: Level entry     Home Layout: Two level;Bed/bath upstairs Home Equipment: None      Prior Function Level of Independence: Independent         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    Extremity/Trunk Assessment   Upper Extremity Assessment Upper Extremity Assessment: Defer to OT evaluation    Lower Extremity Assessment Lower Extremity Assessment: Overall WFL for tasks assessed    Cervical / Trunk Assessment Cervical / Trunk Assessment: Normal  Communication   Communication: No difficulties  Cognition Arousal/Alertness: Awake/alert Behavior During Therapy: WFL for tasks assessed/performed Overall Cognitive Status: Within Functional Limits for tasks assessed  General Comments      Exercises     Assessment/Plan    PT Assessment Patent does not need any further PT services  PT Problem List         PT Treatment Interventions      PT Goals (Current goals can be found in the Care Plan section)   Acute Rehab PT Goals Patient Stated Goal: to feel better PT Goal Formulation: All assessment and education complete, DC therapy    Frequency     Barriers to discharge        Co-evaluation               AM-PAC PT "6 Clicks" Daily Activity  Outcome Measure Difficulty turning over in bed (including adjusting bedclothes, sheets and blankets)?: None Difficulty moving from lying on back to sitting on the side of the bed? : None Difficulty sitting down on and standing up from a chair with arms (e.g., wheelchair, bedside commode, etc,.)?: None Help needed moving to and from a bed to chair (including a wheelchair)?: None Help needed walking in hospital room?: None Help needed climbing 3-5 steps with a railing? : A Little 6 Click Score: 23    End of Session Equipment Utilized During Treatment: Gait belt Activity Tolerance: Patient tolerated treatment well (Reports general fatigue) Patient left: in bed;with call bell/phone within reach Nurse Communication: Mobility status (No further PT needs) PT Visit Diagnosis: Other symptoms and signs involving the nervous system (R29.898);Pain Pain - part of body:  (Headache)    Time: 4268-3419 PT Time Calculation (min) (ACUTE ONLY): 12 min   Charges:   PT Evaluation $PT Eval Moderate Complexity: 1 Procedure     PT G Codes:   PT G-Codes **NOT FOR INPATIENT CLASS** Functional Assessment Tool Used: AM-PAC 6 Clicks Basic Mobility Functional Limitation: Mobility: Walking and moving around Mobility: Walking and Moving Around Current Status (Q2229): 0 percent impaired, limited or restricted Mobility: Walking and Moving Around Goal Status (N9892): 0 percent impaired, limited or restricted Mobility: Walking and Moving Around Discharge Status (J1941): 0 percent impaired, limited or restricted    Carita Pian. Sanjuana Kava, [facility] Acute Rehab Services Pager [phone]   Despina Pole 10/08/2016, 11:05 PM

## 2016-10-08 NOTE — Evaluation (Signed)
Occupational Therapy Evaluation Patient Details Name: Jessica Cruz MRN: [id number] DOB: Aug 23, 1965 Today's Date: 10/08/2016    History of Present Illness Pt is a 51 y.o. female who presented to the ED with inability to talk or move R side for approximately 10 minutes. She has a PMH significant for HTN, HLD, NSTEMI, prediabetes, hedaches, brain aneurysm s/p clipping in 2007. Imaging pending.    Clinical Impression   PTA, pt was independent with ADL, IADL, and functional mobility and was working full time as a Dance movement psychotherapist. She currently presents with light sensitivity and decreased ability to focus vision during functional tasks limiting her ability to complete work tasks at [employer]. She is able to complete basic ADL with increased time. Pt would benefit from continued OT services to continue education concerning strategies to improve functional use of vision. No OT follow-up recommended post-acute D/C. Will continue to follow while admitted.    Follow Up Recommendations  No OT follow up    Equipment Recommendations  None recommended by OT    Recommendations for Other Services       Precautions / Restrictions Precautions Precautions: None Restrictions Weight Bearing Restrictions: No      Mobility Bed Mobility Overal bed mobility: Modified Independent                Transfers Overall transfer level: Modified independent               General transfer comment: Increased time    Balance Overall balance assessment: No apparent balance deficits (not formally assessed)                                         ADL either performed or assessed with clinical judgement   ADL Overall ADL's : Modified independent                                       General ADL Comments: Requires increased time. Reports feeling eye fatigue and generalized fatigue impacting her ability and motivation to participate in ADL.      Vision Baseline  Vision/History: No visual deficits Patient Visual Report:  (needs extra time to focus on visual stimuli) Vision Assessment?: Yes Eye Alignment: Within Functional Limits Ocular Range of Motion: Within Functional Limits Alignment/Gaze Preference: Within Defined Limits Tracking/Visual Pursuits: Able to track stimulus in all quads without difficulty Visual Fields: No apparent deficits Additional Comments: Pt able to read and identify numbers/letters visually. However, sensitivity to light and decreased ability to focus is limiting her abillity to utilize phone and complete IADL and work-related tasks.      Perception     Praxis Praxis Praxis tested?: Within functional limits    Pertinent Vitals/Pain Pain Assessment: 0-10 Pain Score: 6  Pain Location: headache Pain Descriptors / Indicators: Headache     Hand Dominance Right   Extremity/Trunk Assessment Upper Extremity Assessment Upper Extremity Assessment: Overall WFL for tasks assessed   Lower Extremity Assessment Lower Extremity Assessment: Defer to PT evaluation       Communication Communication Communication: No difficulties   Cognition Arousal/Alertness: Awake/alert Behavior During Therapy: WFL for tasks assessed/performed Overall Cognitive Status: Within Functional Limits for tasks assessed  General Comments       Exercises     Shoulder Instructions      Home Living Family/patient expects to be discharged to:: Private residence Living Arrangements: Spouse/significant other Available Help at Discharge: Family;Available PRN/intermittently (husband works during day) Type of Home: House Home Access: Level entry     Home Layout: Two level;Bed/bath upstairs     Bathroom Shower/Tub: Teacher, early years/pre: Standard     Home Equipment: None          Prior Functioning/Environment Level of Independence: Independent        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   OT Frequency: Min 2X/week   Barriers to D/C:            Co-evaluation              AM-PAC PT "6 Clicks" Daily Activity     Outcome Measure Help from another person eating meals?: None Help from another person taking care of personal grooming?: None Help from another person toileting, which includes using toliet, bedpan, or urinal?: None Help from another person bathing (including washing, rinsing, drying)?: None Help from another person to put on and taking off regular upper body clothing?: None Help from another person to put on and taking off regular lower body clothing?: None 6 Click Score: 24   End of Session Nurse Communication: Mobility status  Activity Tolerance: Patient tolerated treatment well Patient left: with nursing/sitter in room (IV team assisting to reposition pt)  OT Visit Diagnosis: Low vision, both eyes (H54.2)                Time: 8676-1950 OT Time Calculation (min): 10 min Charges:  OT General Charges $OT Visit: 1 Procedure OT Evaluation $OT Eval Low Complexity: 1 Procedure G-Codes: OT G-codes **NOT FOR INPATIENT CLASS** Functional Assessment Tool Used: Clinical judgement Functional Limitation: Self care Self Care Current Status (D3267): 0 percent impaired, limited or restricted Self Care Goal Status (T2458): 0 percent impaired, limited or restricted   Norman Herrlich, MS OTR/L  Pager:  Niles A Madigan [employer] 10/08/2016, 3:59 PM

## 2016-10-08 NOTE — Consult Note (Signed)
Referring Physician: Dr. Tana Coast    Chief Complaint: Stroke-like symptoms  HPI: Jessica Cruz is an 51 y.o. female who presents in transfer from [facility] for further evaluation of possible stroke. She endorsed right sided weakness with sensory numbness, right facial droop and inability to speak, with retained ability to understand speech. Stroke Teleneurologist at OSH evaluated the patient and recommended admission for MRI and frequent neuro checks. MRI was unavailable at OSH this weekend, therefore patient was transferred to [facility] for further evaluation and management.   Her PMHx includes intracranial aneurysmal rupture, s/p aneurysm clipping in 2007, in addition to stroke risk factors of HTN, HLD, obesity and pre-diabetes.   Past Medical History:  Diagnosis Date  . Brain aneurysm    a. h/o intracranial aneurysm rupture (clipped [facility] 2007).  . Essential hypertension   . Fibroids   . Hyperlipidemia   . Hypertensive heart disease   . Liver nodule   . Menorrhagia   . Microcytic anemia   . Non-ST elevation myocardial infarction (NSTEMI), type 2 ([place])    a. demand ischemia 01/2016 in setting of accelerated HTN, anemia - normal cors by cath.  . Obesity   . Pre-diabetes     Past Surgical History:  Procedure Laterality Date  . CARDIAC CATHETERIZATION N/A 02/16/2016   Procedure: Left Heart Cath and Coronary Angiography;  Surgeon: Wellington Hampshire, MD;  Location: [facility];  Service: Cardiovascular;  Laterality: N/A;  . CEREBRAL ANEURYSM REPAIR      Family History  Problem Relation Age of Onset  . Heart attack Father 11  . Stroke Father   . Diabetes Mother   . Hypertension Mother   . Heart disease Maternal Grandmother   . Heart attack Paternal Grandmother   . Heart disease Paternal Grandmother   . Heart failure Paternal Grandmother   . Heart failure Paternal Grandfather   . Heart disease Paternal Grandfather   . Heart attack Paternal Grandfather    Social History:   reports that she has never smoked. She has never used smokeless tobacco. She reports that she drinks alcohol. She reports that she does not use drugs.  Allergies:  Allergies  Allergen Reactions  . Pineapple Itching and Swelling    TONGUE AND THROAT AFFECTED  . Shellfish Allergy Itching and Swelling    THROAT AND TONGUE ARE AFFECTED    Home Medications: acetaminophen (TYLENOL) 325 MG tablet Take 325-650 mg by mouth every 6 (six) hours as needed for headache. Norval Morton, MD Not Ordered  aspirin EC 81 MG EC tablet Take 1 tablet (81 mg total) by mouth daily. Norval Morton, MD Not Ordered  atorvastatin (LIPITOR) 40 MG tablet Take 1 tablet (40 mg total) by mouth daily at 6 PM. Norval Morton, MD Reordered  Orderedas:atorvastatin (LIPITOR) tablet 40 mg - 40 mg, Oral, Daily-1800, First dose on Sun 10/08/16 at 1800  carvedilol (COREG) 25 MG tablet Take 1 tablet (25 mg total) by mouth 2 (two) times daily with a meal. Norval Morton, MD Reordered  Orderedas:carvedilol (COREG) tablet 25 mg - 25 mg, Oral, 2 times daily with meals, First dose on Sun 10/08/16 at 0800  carvedilol (COREG) 3.125 MG tablet Take 3.125 mg by mouth daily. Norval Morton, MD Not Ordered  ferrous sulfate 325 (65 FE) MG tablet Take 1 tablet (325 mg total) by mouth 2 (two) times daily with a meal. Smith, Rondell A, MD Reordered  Orderedas:ferrous sulfate tablet 325 mg - 325 mg, Oral,  2 times daily with meals, First dose on Sun 10/08/16 at 0800  hydrochlorothiazide (MICROZIDE) 12.5 MG capsule Take 1 capsule (12.5 mg total) by mouth daily. Norval Morton, MD Reordered  Orderedas:hydrochlorothiazide (MICROZIDE) capsule 12.5 mg - 12.5 mg, Oral, Daily, First dose on Sun 10/08/16 at 1000  lisinopril (PRINIVIL,ZESTRIL) 20 MG tablet Take 1 tablet (20 mg total) by mouth daily. Norval Morton, MD Not Ordered  Orderedas:lisinopril (PRINIVIL,ZESTRIL) tablet 20 mg - 20 mg, Oral, Daily, First dose on Sun 10/08/16 at 1000  (Discontinued)  LORazepam (ATIVAN) 1 MG tablet Take 0.5-1 tablets (0.5-1 mg total) by mouth 3 (three) times daily as needed for anxiety. Norval Morton, MD Not Ordered    ROS: Positive for mild headache. No current chest pain or abdominal pain. No vision loss or confusion. Other ROS as per HPI.   Physical Examination: Blood pressure (!) 174/100, pulse 70, temperature 98.8 F (37.1 C), temperature source Oral, resp. rate 18, SpO2 96 %.  HEENT: [place]/AT Lungs: Respirations unlabored Ext: No edema.   Neurologic Examination: Mental Status: Alert, oriented, thought content appropriate.  Speech fluent without evidence of aphasia.  Able to follow 3 step commands without difficulty. Cranial Nerves: II:  Visual fields intact, PERRL III,IV, VI: ptosis not present, EOMI without nystagmus V,VII: smile symmetric, facial temp sensation normal bilaterally VIII: hearing intact to voice IX,X: no hypophonia XI: symmetric XII: midline tongue extension Motor: Right : Upper extremity   5/5    Left:     Upper extremity   5/5  Lower extremity   5/5     Lower extremity   5/5 Normal tone throughout; no atrophy noted No pronator drift Sensory: Decreased temperature sensation RLE. Temp sensation otherwise normal. FT intact x 4 without extinction.  Deep Tendon Reflexes:  2+ bilateral upper and lower extremities.  Plantars: Right: downgoing  Left: downgoing Cerebellar: No ataxia with FNF bilaterally Gait: Deferred   Results for orders placed or performed during the hospital encounter of 10/08/16 (from the past 48 hour(s))  CBC     Status: Abnormal   Collection Time: 10/08/16  4:03 AM  Result Value Ref Range   WBC 7.5 4.0 - 10.5 K/uL   RBC 5.01 3.87 - 5.11 MIL/uL   Hemoglobin 11.6 (L) 12.0 - 15.0 g/dL   HCT 38.4 36.0 - 46.0 %   MCV 76.6 (L) 78.0 - 100.0 fL   MCH 23.2 (L) 26.0 - 34.0 pg   MCHC 30.2 30.0 - 36.0 g/dL   RDW 17.9 (H) 11.5 - 15.5 %   Platelets 234 150 - 400 K/uL   No results  found.  Assessment: 51 y.o. female with transient right sided weakness, sensory numbness, right facial droop and mutism 1. DDx for her presentation includes a small stroke or TIA, conversion disorder and hypertensive urgency 2. Neurological exam negative on arrival to [facility], except for decreased temperature sensation to RLE. 3. Stroke Risk Factors - HTN, HLD, morbid obesity and pre-diabetes 4. Intracranial aneurysmal rupture, s/p aneurysm clipping in 2007  Plan: 1. HgbA1c, fasting lipid panel 2. MRI, MRA of the brain without contrast 3. PT consult, OT consult, Speech consult 4. Echocardiogram 5. Carotid dopplers 6. Increase ASA to 325 mg po qd 7. Continue atorvastatin 40 mg po qd 8. Telemetry monitoring 9. Frequent neuro checks 10. Should be counseled to begin a weight loss program with diet and exercise.  11. Modified permissive HTN protocol with SBP goal of < 180 rather than 220, as hypertensive urgency may  also have been the underlying etiology for her presentation.    @Electronically  signed: Dr. Kerney Elbe  10/08/2016, 5:14 AM

## 2016-10-08 NOTE — H&P (Addendum)
History and Physical    Jessica Cruz DDU:[id number] DOB: 1965/06/14 DOA: 10/08/2016  Referring MD/NP/PA: Dr. Myna Hidalgo PCP: Patient, No Pcp Per  Patient coming from:  Transfer from [facility]  Chief Complaint: Inability to talk or move my right side   HPI: Jessica Cruz is a 51 y.o. female with medical history significant of HTN, HLD, NSTEMI, prediabetes, headaches, brain aneurysm s/p clipping in 2007; who presents with complaints of inability to talk or move her right side. Symptoms started while the patient was playing a computer game yesterday at 9pm on 6/23. She reports feeling lightheaded with inability to talk or move her right arm or leg. She tried to get up help to call for help but fell. Denies any trauma to her head or any pain. Symptoms lasted approximately 10 minutes and seemingly self resolve. However, patient notes recurrence of symptoms while at the emergency department at [facility]. Associated symptoms included some chest heaviness and complaints of a a stabbing posterior headache. Patient has a history of headaches, but states that her normal headaches are usually on the left side in the temporal frontal region. Denies any recent medication changes, nausea, vomiting, abdominal pain, fever, chills, or dysuria. While at [place] patient had a head CT that she age indeterminate basal ganglia infarcts s/p left craniotomy for clipping of anterior communicating artery aneurysm. Dr. Cheral Marker excepted to see the patient in consultation and [facility] was called to admit.  ED Course: As seen above.  Review of Systems: As per HPI otherwise 10 point review of systems negative.   Past Medical History:  Diagnosis Date  . Brain aneurysm    a. h/o intracranial aneurysm rupture (clipped [facility] 2007).  . Essential hypertension   . Fibroids   . Hyperlipidemia   . Hypertensive heart disease   . Liver nodule   . Menorrhagia   . Microcytic anemia   . Non-ST elevation myocardial infarction (NSTEMI), type 2  ([place] [facility])    a. demand ischemia 01/2016 in setting of accelerated HTN, anemia - normal cors by cath.  . Obesity   . Pre-diabetes     Past Surgical History:  Procedure Laterality Date  . CARDIAC CATHETERIZATION N/A 02/16/2016   Procedure: Left Heart Cath and Coronary Angiography;  Surgeon: Wellington Hampshire, MD;  Location: [facility];  Service: Cardiovascular;  Laterality: N/A;  . CEREBRAL ANEURYSM REPAIR       reports that she has never smoked. She has never used smokeless tobacco. She reports that she drinks alcohol. She reports that she does not use drugs.  Allergies  Allergen Reactions  . Pineapple Itching and Swelling    TONGUE AND THROAT AFFECTED  . Shellfish Allergy Itching and Swelling    THROAT AND TONGUE ARE AFFECTED    Family History  Problem Relation Age of Onset  . Heart attack Father 3  . Stroke Father   . Diabetes Mother   . Hypertension Mother   . Heart disease Maternal Grandmother   . Heart attack Paternal Grandmother   . Heart disease Paternal Grandmother   . Heart failure Paternal Grandmother   . Heart failure Paternal Grandfather   . Heart disease Paternal Grandfather   . Heart attack Paternal Grandfather     Prior to Admission medications   Medication Sig Start Date End Date Taking? Authorizing Provider  acetaminophen (TYLENOL) 325 MG tablet Take 325-650 mg by mouth every 6 (six) hours as needed for headache.    [provider]  aspirin EC  81 MG EC tablet Take 1 tablet (81 mg total) by mouth daily. 02/19/16   Ghimire, Henreitta Leber, MD  atorvastatin (LIPITOR) 40 MG tablet Take 1 tablet (40 mg total) by mouth daily at 6 PM. 02/18/16   Ghimire, Henreitta Leber, MD  carvedilol (COREG) 25 MG tablet Take 1 tablet (25 mg total) by mouth 2 (two) times daily with a meal. 02/25/16   Dunn, Dayna N, PA-C  carvedilol (COREG) 3.125 MG tablet Take 3.125 mg by mouth daily.    [provider]  ferrous sulfate 325 (65 FE) MG tablet Take 1 tablet (325 mg total)  by mouth 2 (two) times daily with a meal. 02/18/16   Ghimire, Henreitta Leber, MD  hydrochlorothiazide (MICROZIDE) 12.5 MG capsule Take 1 capsule (12.5 mg total) by mouth daily. 02/19/16   Ghimire, Henreitta Leber, MD  lisinopril (PRINIVIL,ZESTRIL) 20 MG tablet Take 1 tablet (20 mg total) by mouth daily. 02/19/16   Ghimire, Henreitta Leber, MD  LORazepam (ATIVAN) 1 MG tablet Take 0.5-1 tablets (0.5-1 mg total) by mouth 3 (three) times daily as needed for anxiety. 02/27/16   Antonietta Breach, PA-C    Physical Exam: There were no vitals filed for this visit.    Constitutional: NAD, calm, comfortable There were no vitals filed for this visit. Eyes: PERRL, lids and conjunctivae normal ENMT: Mucous membranes are moist. Posterior pharynx clear of any exudate or lesions.Normal dentition.  Neck: normal, supple, no masses, no thyromegaly Respiratory: clear to auscultation bilaterally, no wheezing, no crackles. Normal respiratory effort. No accessory muscle use.  Cardiovascular: Regular rate and rhythm, no murmurs / rubs / gallops. No extremity edema. 2+ pedal pulses. No carotid bruits.  Abdomen: no tenderness, no masses palpated. No hepatosplenomegaly. Bowel sounds positive.  Musculoskeletal: no clubbing / cyanosis. No joint deformity upper and lower extremities. Good ROM, no contractures. Normal muscle tone.  Skin: no rashes, lesions, ulcers. No induration Neurologic: CN 2-12 grossly intact. Sensation intact, DTR normal. Strength 5/5 in all 4.  Psychiatric: Normal judgment and insight. Alert and oriented x 3. Normal mood.     Labs on Admission: I have personally reviewed following labs and imaging studies  CBC: No results for input(s): WBC, NEUTROABS, HGB, HCT, MCV, PLT in the last 168 hours. Basic Metabolic Panel: No results for input(s): NA, K, CL, CO2, GLUCOSE, BUN, CREATININE, CALCIUM, MG, PHOS in the last 168 hours. GFR: CrCl cannot be calculated (Patient's most recent lab result is older than the maximum 21  days allowed.). Liver Function Tests: No results for input(s): AST, ALT, ALKPHOS, BILITOT, PROT, ALBUMIN in the last 168 hours. No results for input(s): LIPASE, AMYLASE in the last 168 hours. No results for input(s): AMMONIA in the last 168 hours. Coagulation Profile: No results for input(s): INR, PROTIME in the last 168 hours. Cardiac Enzymes: No results for input(s): CKTOTAL, CKMB, CKMBINDEX, TROPONINI in the last 168 hours. BNP (last 3 results) No results for input(s): PROBNP in the last 8760 hours. HbA1C: No results for input(s): HGBA1C in the last 72 hours. CBG: No results for input(s): GLUCAP in the last 168 hours. Lipid Profile: No results for input(s): CHOL, HDL, LDLCALC, TRIG, CHOLHDL, LDLDIRECT in the last 72 hours. Thyroid Function Tests: No results for input(s): TSH, T4TOTAL, FREET4, T3FREE, THYROIDAB in the last 72 hours. Anemia Panel: No results for input(s): VITAMINB12, FOLATE, FERRITIN, TIBC, IRON, RETICCTPCT in the last 72 hours. Urine analysis:    Component Value Date/Time   COLORURINE YELLOW 07/29/2013 0507   APPEARANCEUR CLOUDY (  A) 07/29/2013 0507   LABSPEC 1.034 (H) 07/29/2013 0507   PHURINE 5.5 07/29/2013 0507   GLUCOSEU NEGATIVE 07/29/2013 0507   HGBUR LARGE (A) 07/29/2013 0507   BILIRUBINUR NEGATIVE 07/29/2013 0507   KETONESUR 15 (A) 07/29/2013 0507   PROTEINUR 30 (A) 07/29/2013 0507   UROBILINOGEN 1.0 07/29/2013 0507   NITRITE NEGATIVE 07/29/2013 0507   LEUKOCYTESUR NEGATIVE 07/29/2013 0507   Sepsis Labs: No results found for this or any previous visit (from the past 240 hour(s)).   Radiological Exams on Admission: No results found.  EKG: Independently reviewed. Sinus rhythm with multiple PVCs  Assessment/Plan Possible TIA with Expressive aphasia and right-sided hemiparesis: Acute. Patient presents as a transfer from [facility] for intermittent expressive aphasia and right-sided weakness. Initial CT scan of brain negative for any signs of  acute abnormalities, but did show indeterminate basal ganglia infarcts on the left, left craniotomy, and prior anterior communicating artery aneurysm clipping. Patient was transferred to [facility] for need of MRI. On the differential includes complex migraine  - Admit to telemetry bed - Stroke order set initiated - Neuro checks - Check  MRI/MRA head w/o contrast Vas carotid U/S,   - PT/OT/Speech to eval and treat - Check echocardiogram - Check Hemoglobin A1c and lipid panel in a.m. - ASA - Appreciate neurology consultative services, will follow-up for further recommendations  H/O aneurysm status post clipping  Chest discomfort: Patient with previously normal cardiac cath in 02/2016. Elevated troponins were thought to be secondary to demand ischemia from uncontrolled hypertension. - trend cardiac enzymes  Hypertensive urgency: Blood pressure is elevated up to 196/109 on admission - Continue lisinopril, Coreg, and hydrochlorothiazide - Hydralazine IV prn elevated BP  Anemia: Hemoglobin noted to be 11.7 at outside facility. - Recheck CBC this a.m. - Continue ferrous sulfate  Hyperlipidemia - Continue Atorvastatin  DVT prophylaxis: Lovenox Code Status: Full Family Communication: Discussed plan of care with patient and family present at bedside Disposition Plan: TBD Consults called: Neurology Admission status: Observation  Norval Morton MD [facility] Pager [phone]  If 7PM-7AM, please contact night-coverage [email or web address] Password [email or web address]  10/08/2016, 3:50 AM

## 2016-10-08 NOTE — Plan of Care (Signed)
Noted Dr. Cheral Marker just saw this pt earlier today. Chart reviewed. I have ordered CTA head and neck. Currently MRI and TTE as well as A1C and UDS are also pending. LDL = 170. She is on ASA and lipitor. Stroke team will follow in am. Please call with any questions at the mean time.   Rosalin Hawking, MD PhD Stroke Neurology 10/08/2016 8:30 AM

## 2016-10-09 ENCOUNTER — Observation Stay (HOSPITAL_COMMUNITY): Payer: 59

## 2016-10-09 ENCOUNTER — Observation Stay (HOSPITAL_BASED_OUTPATIENT_CLINIC_OR_DEPARTMENT_OTHER): Payer: 59

## 2016-10-09 DIAGNOSIS — R4701 Aphasia: Secondary | ICD-10-CM | POA: Diagnosis not present

## 2016-10-09 DIAGNOSIS — G459 Transient cerebral ischemic attack, unspecified: Secondary | ICD-10-CM

## 2016-10-09 DIAGNOSIS — G458 Other transient cerebral ischemic attacks and related syndromes: Secondary | ICD-10-CM

## 2016-10-09 LAB — RAPID URINE DRUG SCREEN, HOSP PERFORMED
Amphetamines: NOT DETECTED
Barbiturates: NOT DETECTED
Benzodiazepines: NOT DETECTED
Cocaine: NOT DETECTED
Opiates: NOT DETECTED
Tetrahydrocannabinol: NOT DETECTED

## 2016-10-09 LAB — HEMOGLOBIN A1C
Hgb A1c MFr Bld: 6.5 % — ABNORMAL HIGH (ref 4.8–5.6)
Mean Plasma Glucose: 140 mg/dL

## 2016-10-09 LAB — ECHOCARDIOGRAM COMPLETE
Height: 66 in
Weight: 3920 oz

## 2016-10-09 MED ORDER — HYDRALAZINE HCL 50 MG PO TABS: 50.0000 mg | ORAL_TABLET | Freq: Three times a day (TID) | ORAL | 0 refills | Status: DC

## 2016-10-09 MED ORDER — ATORVASTATIN CALCIUM 80 MG PO TABS: 80.0000 mg | ORAL_TABLET | Freq: Every day | ORAL | 0 refills | Status: DC

## 2016-10-09 MED ORDER — ASPIRIN 325 MG PO TABS: 325.0000 mg | ORAL_TABLET | Freq: Every day | ORAL | 0 refills | Status: DC

## 2016-10-09 NOTE — Discharge Summary (Signed)
Jessica Cruz NGE:[id number] DOB: [birth year] DOA: 10/08/2016  PCP: Patient, No Pcp Per  Admit date: 10/08/2016  Discharge date: 10/09/2016  Admitted From: Home   Disposition:  Home   Recommendations for Outpatient Follow-up:   Follow up with PCP in 1-2 weeks  PCP Please obtain BMP/CBC, 2 view CXR in 1week,  (see Discharge instructions)   PCP Please follow up on the following pending results: None   Home Health: None   Equipment/Devices: None  Consultations: Neuro Discharge Condition: Stable   CODE STATUS: Full   Diet Recommendation:  Heart Healthy    No chief complaint on file.    Brief history of present illness from the day of admission and additional interim summary     Jessica Cruz a 51 y.o.femalewith medical history significant of HTN, HLD, NSTEMI, prediabetes, headaches, brain aneurysm s/p clipping in 2007; who presents with complaints of inability to talk or move her right side, this happened in home and lasted for about 10 minutes. She presented initially to [facility] ER where she was found to have high blood pressure, CT head overall was nonacute. She was then transferred to [facility] for further neurological workup.                                                                 Hospital Course     1.Expressive aphasia and right-sided hemiparesis lasting 10 minutes. Per neurology this was likely TIA, Symptoms have all completely resolved, have adjusted blood pressure medications for better control, have increased Lipitor to max dose as LDL was above goal at 170, A1c is pending, -ve CTA head and CT head x 2 ( DW Dr Leonie Man), MRI could not be done as she has Intracranial Clip ( ? comparability per radiology) . DW Dr Leonie Man DC home, with outpt follow up. PCP please monitor secondary risk factors  including LDL and A1c, patient may be included in [facility] trial per Neuro.  2. Mildly elevated flat non ACS pattern Trop, chest pain-free, EKG nonacute, echo with stable wall motion and EF, currently on aspirin, statin and beta blocker for secondary prevention. PCP to monitor secondary risk factors.  3. Dyslipidemia. Statin dose maximized to bring LDL to goal.  4. History of brain aneurysm status post clipping in 2007. Supportive care.  5. Morbid obesity. Follow with PCP for weight loss.  6. Hypertensive urgency. Have added hydralazine to high-dose Coreg, HCTZ and ACE inhibitor for better control , PCP to monitor.    Lab Results  Component Value Date   CHOL 255 (H) 10/08/2016   HDL 36 (L) 10/08/2016   LDLCALC 170 (H) 10/08/2016   TRIG 244 (H) 10/08/2016   CHOLHDL 7.1 10/08/2016   Lab Results  Component Value Date   HGBA1C 6.5 (H) 10/08/2016  Discharge diagnosis     Principal Problem:   TIA (transient ischemic attack) Active Problems:   Hyperlipidemia   Expressive aphasia   Right hemiparesis (HCC)   Chest discomfort   Hypertensive urgency   History of cerebral aneurysm repair    Discharge instructions    Discharge Instructions    Diet - low sodium heart healthy    Complete by:  As directed    Discharge instructions    Complete by:  As directed    Follow with your Primary MD in 7 days   Get CBC, CMP, 2 view Chest X ray checked  by Primary MD or SNF MD in 5-7 days ( we routinely change or add medications that can affect your baseline labs and fluid status, therefore we recommend that you get the mentioned basic workup next visit with your PCP, your PCP may decide not to get them or add new tests based on their clinical decision)  Activity: As tolerated with Full fall precautions use walker/cane & assistance as needed  Disposition Home    Diet:   Heart Healthy    For Heart failure patients - Check your Weight same time everyday, if you gain over 2  pounds, or you develop in leg swelling, experience more shortness of breath or chest pain, call your Primary MD immediately. Follow Cardiac Low Salt Diet and 1.5 lit/day fluid restriction.  On your next visit with your primary care physician please Get Medicines reviewed and adjusted.  Please request your Prim.MD to go over all Hospital Tests and Procedure/Radiological results at the follow up, please get all Hospital records sent to your Prim MD by signing hospital release before you go home.  If you experience worsening of your admission symptoms, develop shortness of breath, life threatening emergency, suicidal or homicidal thoughts you must seek medical attention immediately by calling 911 or calling your MD immediately  if symptoms less severe.  You Must read complete instructions/literature along with all the possible adverse reactions/side effects for all the Medicines you take and that have been prescribed to you. Take any new Medicines after you have completely understood and accpet all the possible adverse reactions/side effects.   Do not drive, operate heavy machinery, perform activities at heights, swimming or participation in water activities or provide baby sitting services if your were admitted for syncope or siezures until you have seen by Primary MD or a Neurologist and advised to do so again.  Do not drive when taking Pain medications.    Do not take more than prescribed Pain, Sleep and Anxiety Medications  Special Instructions: If you have smoked or chewed Tobacco  in the last 2 yrs please stop smoking, stop any regular Alcohol  and or any Recreational drug use.  Wear Seat belts while driving.   Please note  You were cared for by a hospitalist during your hospital stay. If you have any questions about your discharge medications or the care you received while you were in the hospital after you are discharged, you can call the unit and asked to speak with the hospitalist on  call if the hospitalist that took care of you is not available. Once you are discharged, your primary care physician will handle any further medical issues. Please note that NO REFILLS for any discharge medications will be authorized once you are discharged, as it is imperative that you return to your primary care physician (or establish a relationship with a primary care physician if you do not have  one) for your aftercare needs so that they can reassess your need for medications and monitor your lab values.   Increase activity slowly    Complete by:  As directed       Discharge Medications   Allergies as of 10/09/2016      Reactions   Pineapple Itching, Swelling   TONGUE AND THROAT AFFECTED   Shellfish Allergy Itching, Swelling   THROAT AND TONGUE ARE AFFECTED      Medication List    STOP taking these medications   aspirin 81 MG EC tablet Replaced by:  aspirin 325 MG tablet     TAKE these medications   acetaminophen 325 MG tablet Commonly known as:  TYLENOL Take 325-650 mg by mouth every 6 (six) hours as needed for headache.   aspirin 325 MG tablet Take 1 tablet (325 mg total) by mouth daily. Start taking on:  10/10/2016 Replaces:  aspirin 81 MG EC tablet   atorvastatin 80 MG tablet Commonly known as:  LIPITOR Take 1 tablet (80 mg total) by mouth daily at 6 PM. What changed:  medication strength  how much to take   carvedilol 25 MG tablet Commonly known as:  COREG Take 1 tablet (25 mg total) by mouth 2 (two) times daily with a meal.   ferrous sulfate 325 (65 FE) MG tablet Take 1 tablet (325 mg total) by mouth 2 (two) times daily with a meal.   hydrALAZINE 50 MG tablet Commonly known as:  APRESOLINE Take 1 tablet (50 mg total) by mouth every 8 (eight) hours.   hydrochlorothiazide 12.5 MG capsule Commonly known as:  MICROZIDE Take 1 capsule (12.5 mg total) by mouth daily.   lisinopril 20 MG tablet Commonly known as:  PRINIVIL,ZESTRIL Take 1 tablet (20 mg  total) by mouth daily.   LORazepam 1 MG tablet Commonly known as:  ATIVAN Take 0.5-1 tablets (0.5-1 mg total) by mouth 3 (three) times daily as needed for anxiety.       Follow-up Information    Garvin Fila, MD. Schedule an appointment as soon as possible for a visit in 3 week(s).   Specialties:  Neurology, Radiology Contact information: [address] [place] [address] [phone]        Your PCP. Schedule an appointment as soon as possible for a visit in 1 week(s).           Major procedures and Radiology Reports - PLEASE review detailed and final reports thoroughly  -     TTE  - Left ventricle: The cavity size was normal. Systolic function was   normal. The estimated ejection fraction was in the range of 60%   to 65%. Wall motion was normal; there were no regional wall   motion abnormalities. Left ventricular diastolic function   parameters were normal. - Atrial septum: No defect or patent foramen ovale was identified.    Ct Angio Head W Or Wo Contrast  Result Date: 10/08/2016 CLINICAL DATA:  Right-sided weakness. Aphasia. Prior cerebral aneurysm clipping. EXAM: CT ANGIOGRAPHY HEAD AND NECK TECHNIQUE: Multidetector CT imaging of the head and neck was performed using the standard protocol during bolus administration of intravenous contrast. Multiplanar CT image reconstructions and MIPs were obtained to evaluate the vascular anatomy. Carotid stenosis measurements (when applicable) are obtained utilizing NASCET criteria, using the distal internal carotid diameter as the denominator. CONTRAST:  50 mL Isovue 370 COMPARISON:  Head CT 10/07/2016 and 11/12/2013 FINDINGS: CT HEAD FINDINGS Brain: A subcentimeter hypodensity is  again seen in the left putamen, new from 2015 and compatible with an interval lacunar infarct of indeterminate age although with a more chronic appearance on today's CT compared to yesterday's. There is no evidence of acute large territory  infarct, intracranial hemorrhage, mass, midline shift, or extra-axial fluid collection. The ventricles are normal in size. Vascular: Aneurysm clip in the anterior communicating region. No hyperdense vessel. Skull: Left frontal craniotomy. No acute fracture or suspicious osseous lesion. Sinuses: Visualized paranasal sinuses and mastoid air cells are clear. Orbits: Unremarkable. Review of the MIP images confirms the above findings CTA NECK FINDINGS Aortic arch: Normal variant aortic arch branching pattern with common origin of the brachiocephalic and left common carotid arteries. Widely patent arch vessel origins. Right carotid system: Widely patent common carotid artery and bifurcation. Mildly prominent tapering of the ICA at the distal aspect of the bulb with slightly small caliber diffusely of the remainder of the cervical ICA without focal stenosis, significant vessel irregularity, or dissection flap identified. Left carotid system: Patent without evidence of stenosis or dissection. Vertebral arteries: Patent and codominant. Limited assessment of the left V1 segment due to adjacent dense venous contrast and artifact from the patient's shoulders. No significant stenosis identified in the more distal left vertebral artery or right vertebral artery. Skeleton: Mild cervical spondylosis. Other neck: 3.1 x 2.5 x 2.1 cm enhancing soft tissue mass at the level of the posterior hard palate on the left with osseous remodeling superolaterally and with osseous thinning/destruction more medially with the mass partially extending into the inferior aspect of the posterior nasal cavity. No enlarged lymph nodes in the neck. Upper chest: Clear lung apices. Review of the MIP images confirms the above findings CTA HEAD FINDINGS Anterior circulation: The internal carotid arteries are patent from skullbase to carotid termini without focal stenosis. The right ICA is mildly smaller than the left diffusely, and this is likely secondary to  developmental absence or occlusion of the right A1 segment. The left A1 segment is widely patent and supplies both A2 segments. An aneurysm clip is present at the anterior communicating level without a recurrent aneurysm identified. MCAs are patent without evidence of proximal branch occlusion or significant proximal stenosis. There is normal variant right MCA anatomy with either early bifurcation or MCA duplication noted. There is streak artifact from the aneurysm clip which extends through proximal right M2 branches, and this is felt to account for the appearance on the MIP images rather than there being significant stenosis. Posterior circulation: The intracranial vertebral arteries are patent to the basilar. There is irregularity and mild narrowing of both V4 segments. Patent AICA and SCA origins are identified bilaterally. The basilar artery is widely patent. Posterior communicating arteries are diminutive or absent. PCAs are patent without evidence of significant stenosis. No aneurysm. Venous sinuses: Patent. Anatomic variants: Right MCA duplication or early branching. Absent or occluded right A1. Delayed phase: No abnormal enhancement. Review of the MIP images confirms the above findings IMPRESSION: 1. No large vessel occlusion. 2. Prior anterior communicating aneurysm clipping without evidence of recurrent aneurysm. Mildly limited right MCA evaluation due to associated streak artifact. 3. Mild bilateral V4 segment narrowing. 4. Widely patent cervical carotid arteries. 5. Left basal ganglia lacunar infarct, of indeterminate age though more chronic in appearance on today's examination. 6. 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 Correlate with direct visualization and suggest ENT referral and biopsy. Electronically Signed   By: Logan Bores M.D.   On: 10/08/2016 15:51   Ct  Head Wo Contrast  Result Date: 10/09/2016 CLINICAL DATA:  51 year old female with episode of right-sided weakness and  difficulty speaking which lasted for 10 minutes. Symptoms have resolved. Aneurysm post clipping. Subsequent encounter. EXAM: CT HEAD WITHOUT CONTRAST TECHNIQUE: Contiguous axial images were obtained from the base of the skull through the vertex without intravenous contrast. COMPARISON:  10/08/2016, 10/07/2016 and 11/12/2013 CT. FINDINGS: Brain: Streak artifact from anterior communicating artery aneurysm clipping. No intracranial hemorrhage. Left lenticular nucleus small infarct of indeterminate age. No CT evidence of large acute infarct. No intracranial hemorrhage. No hydrocephalus. No intracranial mass lesion noted on this unenhanced exam. Vascular: Post anterior communicating artery aneurysm clipping. Skull: Prior left craniotomy. Sinuses/Orbits: No acute orbital abnormality. Visualized sinuses are clear. Other: Visualized mastoid air cells and middle ear cavities are clear. Left hard palate mass noted on recent CT angiogram not as well delineated on present exam. IMPRESSION: Left lenticular nucleus small infarct of indeterminate age. No CT evidence of large acute infarct. Post clipping anterior communicating artery aneurysm with streak artifact. No intracranial hemorrhage. Electronically Signed   By: Genia Del M.D.   On: 10/09/2016 13:59   Ct Angio Neck W Or Wo Contrast  Result Date: 10/08/2016 CLINICAL DATA:  Right-sided weakness. Aphasia. Prior cerebral aneurysm clipping. EXAM: CT ANGIOGRAPHY HEAD AND NECK TECHNIQUE: Multidetector CT imaging of the head and neck was performed using the standard protocol during bolus administration of intravenous contrast. Multiplanar CT image reconstructions and MIPs were obtained to evaluate the vascular anatomy. Carotid stenosis measurements (when applicable) are obtained utilizing NASCET criteria, using the distal internal carotid diameter as the denominator. CONTRAST:  50 mL Isovue 370 COMPARISON:  Head CT 10/07/2016 and 11/12/2013 FINDINGS: CT HEAD FINDINGS Brain:  A subcentimeter hypodensity is again seen in the left putamen, new from 2015 and compatible with an interval lacunar infarct of indeterminate age although with a more chronic appearance on today's CT compared to yesterday's. There is no evidence of acute large territory infarct, intracranial hemorrhage, mass, midline shift, or extra-axial fluid collection. The ventricles are normal in size. Vascular: Aneurysm clip in the anterior communicating region. No hyperdense vessel. Skull: Left frontal craniotomy. No acute fracture or suspicious osseous lesion. Sinuses: Visualized paranasal sinuses and mastoid air cells are clear. Orbits: Unremarkable. Review of the MIP images confirms the above findings CTA NECK FINDINGS Aortic arch: Normal variant aortic arch branching pattern with common origin of the brachiocephalic and left common carotid arteries. Widely patent arch vessel origins. Right carotid system: Widely patent common carotid artery and bifurcation. Mildly prominent tapering of the ICA at the distal aspect of the bulb with slightly small caliber diffusely of the remainder of the cervical ICA without focal stenosis, significant vessel irregularity, or dissection flap identified. Left carotid system: Patent without evidence of stenosis or dissection. Vertebral arteries: Patent and codominant. Limited assessment of the left V1 segment due to adjacent dense venous contrast and artifact from the patient's shoulders. No significant stenosis identified in the more distal left vertebral artery or right vertebral artery. Skeleton: Mild cervical spondylosis. Other neck: 3.1 x 2.5 x 2.1 cm enhancing soft tissue mass at the level of the posterior hard palate on the left with osseous remodeling superolaterally and with osseous thinning/destruction more medially with the mass partially extending into the inferior aspect of the posterior nasal cavity. No enlarged lymph nodes in the neck. Upper chest: Clear lung apices. Review of  the MIP images confirms the above findings CTA HEAD FINDINGS Anterior circulation: The internal carotid  arteries are patent from skullbase to carotid termini without focal stenosis. The right ICA is mildly smaller than the left diffusely, and this is likely secondary to developmental absence or occlusion of the right A1 segment. The left A1 segment is widely patent and supplies both A2 segments. An aneurysm clip is present at the anterior communicating level without a recurrent aneurysm identified. MCAs are patent without evidence of proximal branch occlusion or significant proximal stenosis. There is normal variant right MCA anatomy with either early bifurcation or MCA duplication noted. There is streak artifact from the aneurysm clip which extends through proximal right M2 branches, and this is felt to account for the appearance on the MIP images rather than there being significant stenosis. Posterior circulation: The intracranial vertebral arteries are patent to the basilar. There is irregularity and mild narrowing of both V4 segments. Patent AICA and SCA origins are identified bilaterally. The basilar artery is widely patent. Posterior communicating arteries are diminutive or absent. PCAs are patent without evidence of significant stenosis. No aneurysm. Venous sinuses: Patent. Anatomic variants: Right MCA duplication or early branching. Absent or occluded right A1. Delayed phase: No abnormal enhancement. Review of the MIP images confirms the above findings IMPRESSION: 1. No large vessel occlusion. 2. Prior anterior communicating aneurysm clipping without evidence of recurrent aneurysm. Mildly limited right MCA evaluation due to associated streak artifact. 3. Mild bilateral V4 segment narrowing. 4. Widely patent cervical carotid arteries. 5. Left basal ganglia lacunar infarct, of indeterminate age though more chronic in appearance on today's examination. 6. 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  Correlate with direct visualization and suggest ENT referral and biopsy. Electronically Signed   By: Logan Bores M.D.   On: 10/08/2016 15:51    Micro Results     No results found for this or any previous visit (from the past 240 hour(s)).  Today   Subjective    Jessica Cruz today has no headache,no chest abdominal pain,no new weakness tingling or numbness, feels much better wants to go home today.     Objective   Blood pressure (!) 148/77, pulse 71, temperature 98.1 F (36.7 C), temperature source Oral, resp. rate 16, height 5\' 6"  (1.676 m), weight 111.1 kg (245 lb), last menstrual period 09/15/2016, SpO2 99 %.  No intake or output data in the 24 hours ending 10/09/16 1406  Exam Awake Alert, Oriented x 3, No new F.N deficits, Normal affect [place].AT,PERRAL Supple Neck,No JVD, No cervical lymphadenopathy appriciated.  Symmetrical Chest wall movement, Good air movement bilaterally, CTAB RRR,No Gallops,Rubs or new Murmurs, No Parasternal Heave +ve B.Sounds, Abd Soft, Non tender, No organomegaly appriciated, No rebound -guarding or rigidity. No Cyanosis, Clubbing or edema, No new Rash or bruise   Data Review   CBC w Diff:  Lab Results  Component Value Date   WBC 7.5 10/08/2016   HGB 11.6 (L) 10/08/2016   HCT 38.4 10/08/2016   PLT 234 10/08/2016   LYMPHOPCT 38 02/26/2016   MONOPCT 5 02/26/2016   EOSPCT 2 02/26/2016   BASOPCT 1 02/26/2016    CMP:  Lab Results  Component Value Date   NA 137 10/08/2016   K 3.5 10/08/2016   CL 108 10/08/2016   CO2 20 (L) 10/08/2016   BUN 11 10/08/2016   CREATININE 0.86 10/08/2016   CREATININE 0.98 02/25/2016   PROT 7.1 10/08/2016   ALBUMIN 3.4 (L) 10/08/2016   BILITOT 0.7 10/08/2016   ALKPHOS 55 10/08/2016   AST 20 10/08/2016   ALT  18 10/08/2016  .   Total Time in preparing paper work, data evaluation and todays exam - 30 minutes  Lala Lund M.D on 10/09/2016 at 2:06 PM  [facility]   Office  [phone]

## 2016-10-09 NOTE — Progress Notes (Signed)
  Echocardiogram 2D Echocardiogram has been performed.  Jessica Cruz 10/09/2016, 10:17 AM

## 2016-10-09 NOTE — Progress Notes (Signed)
STROKE TEAM PROGRESS NOTE   HISTORY OF PRESENT ILLNESS (per record) Jessica Cruz is an 51 y.o. female with a history of brain aneurysm s/p clipping 2007, hypertension, hyperlipidemia, NSTEMI, obesity, and pre-diabetes who presented as a transfer from [facility] for further evaluation of possible stroke.  On arrival she endorsed right sided weakness with sensory numbness, right facial droop and inability to speak, with retained ability to understand speech. Stroke Teleneurologist at OSH evaluated the patient and recommended admission for MRI and frequent neuro checks. MRI was unavailable at OSH on the weekend of 10/07/16-10/08/16, therefore patient was transferred to [facility] for further evaluation and management.   Patient was not administered IV t-PA secondary to presentation with minimal deficits on exam. She was admitted to General Neurology for further evaluation and treatment.   SUBJECTIVE (INTERVAL HISTORY) Her husband is at the bedside.  The patient is not sure whether her aneurysm clips are MRI-compatible.  The clipping was performed at [facility] in 2007.  The patient believes her surgeon was named "Keturah Barre", but is not certain.   OBJECTIVE Temp:  [98 F (36.7 C)-98.8 F (37.1 C)] 98.1 F (36.7 C) (06/25 0928) Pulse Rate:  [62-71] 71 (06/25 0928) Cardiac Rhythm: Normal sinus rhythm (06/25 0700) Resp:  [16-18] 16 (06/25 0928) BP: (134-151)/(77-94) 148/77 (06/25 0928) SpO2:  [96 %-100 %] 99 % (06/25 0928) Weight:  [111.1 kg (245 lb)] 111.1 kg (245 lb) (06/24 2300)  CBC:  Recent Labs Lab 10/08/16 0403  WBC 7.5  HGB 11.6*  HCT 38.4  MCV 76.6*  PLT 973    Basic Metabolic Panel:  Recent Labs Lab 10/08/16 0403  NA 137  K 3.5  CL 108  CO2 20*  GLUCOSE 115*  BUN 11  CREATININE 0.86  CALCIUM 8.9    Lipid Panel:    Component Value Date/Time   CHOL 255 (H) 10/08/2016 0403   TRIG 244 (H) 10/08/2016 0403   HDL 36 (L) 10/08/2016 0403   CHOLHDL 7.1 10/08/2016  0403   VLDL 49 (H) 10/08/2016 0403   LDLCALC 170 (H) 10/08/2016 0403   HgbA1c:  Lab Results  Component Value Date   HGBA1C 6.5 (H) 10/08/2016   Urine Drug Screen:    Component Value Date/Time   LABOPIA NONE DETECTED 10/09/2016 0142   COCAINSCRNUR NONE DETECTED 10/09/2016 0142   LABBENZ NONE DETECTED 10/09/2016 0142   AMPHETMU NONE DETECTED 10/09/2016 0142   THCU NONE DETECTED 10/09/2016 0142   LABBARB NONE DETECTED 10/09/2016 0142    Alcohol Level No results found for: ETH  IMAGING  Ct Angio Head W Or Wo Contrast 10/08/2016 IMPRESSION: 1. No large vessel occlusion. 2. Prior anterior communicating aneurysm clipping without evidence of recurrent aneurysm. Mildly limited right MCA evaluation due to associated streak artifact. 3. Mild bilateral V4 segment narrowing. 4. Widely patent cervical carotid arteries. 5. Left basal ganglia lacunar infarct, of indeterminate age though more chronic in appearance on today's examination. 6. 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 Correlate with direct visualization and suggest ENT referral and biopsy.   Ct Head Wo Contrast 10/09/2016 IMPRESSION: Left lenticular nucleus small infarct of indeterminate age. No CT evidence of large acute infarct. Post clipping anterior communicating artery aneurysm with streak artifact. No intracranial hemorrhage.     PHYSICAL EXAM Obese pleasant young African-American lady currently not in distress. . Afebrile. Head is nontraumatic. Neck is supple without bruit.    Cardiac exam no murmur or gallop. Lungs are clear to auscultation. Distal  pulses are well felt. Neurological Exam ;  Awake  Alert oriented x 3. Normal speech and language.eye movements full without nystagmus.fundi were not visualized. Vision acuity and fields appear normal. Hearing is normal. Palatal movements are normal. Face symmetric. Tongue midline. Normal strength, tone, reflexes and coordination. Normal sensation. Gait  deferred.  ASSESSMENT/PLAN Ms. Jessica Cruz is a 51 y.o. female with history of brain aneurysm s/p clipping 2007, hypertension, hyperlipidemia, NSTEMI, obesity, and pre-diabetes  presenting with right sided weakness with sensory numbness, right facial droop and inability to speak, with retained ability to understand speech. She did not receive IV t-PA due to presenting with minimal deficits on exam.   TIA: MRI pending, symptoms in the setting of hypertension, hyperlipidemia, remote lacunar infarct, pre-diabetes, and prior ruptured cerebral aneurysm Resultant  No deficits  CT head:  Left lenticular nucleus small infarct of indeterminate age.  CT Angio: Remote left basal ganglia lacunar infarct.  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 No source of embolus   LDL 170  HgbA1c 6.5  Lovenox 40 mg sq daily   for VTE prophylaxis  Diet Heart Room service appropriate? Yes; Fluid consistency: Thin  Diet - low sodium heart healthy  aspirin 81 mg daily prior to admission, now on aspirin 325 mg daily  Patient counseled to be compliant with her antithrombotic medications  Ongoing aggressive stroke risk factor management  Therapy recommendations:  none Disposition:  home Hypertension  Stable Long-term BP goal normotensive  Hyperlipidemia  Home meds:  Atorvastatin 40mg  PO daily  LDL 170, goal < 70  Increase dose to atorvastatin 80mg  PO daily  Continue statin at discharge  Pre-diabetes  HgbA1c 6.5, goal < 7.0  Uncontrolled  Other Stroke Risk Factors  ETOH use, advised to drink no more than 1 drink a day  Obesity, Body mass index is 39.54 kg/m., recommend weight loss, diet and exercise as appropriate   Hx ruptured cerebral aneurysm, s/p clipping 2007  Family hx stroke (father)  Other Active Problems  Liver nodule  Hospital day # 0  I have personally examined this patient, reviewed notes, independently viewed  imaging studies, participated in medical decision making and plan of care.ROS completed by me personally and pertinent positives fully documented  I have made any additions or clarifications directly to the above note. She presented with 2 transient episodes of speech difficulties and right hemiparesis is likely due to left hemispheric TIAs. She remains at risk for recurrent strokes and TIAs and needs ongoing stroke evaluation and aggressive risk factor modification. Recommend aspirin for stroke prevention and add Lipitor. Long discussion with patient and husband and answered questions. Consider possible participation in the [employer] stroke prevention trial if interested. She will be given information to review and decide. Greater than 50% time during this 35 minute visit was spent on counseling and coordination of care about stroke and TIA risk, evaluation and treatment plan and answering questions  Antony Contras, MD Medical Director [place] Pager: [phone] 10/09/2016 4:11 PM   To contact Stroke Continuity provider, please refer to [email or web address] After hours, contact General Neurology

## 2016-10-09 NOTE — Care Management Note (Signed)
Case Management Note  Patient Details  Name: VOULA WALN MRN: [id number] Date of Birth: 1965-11-09  Subjective/Objective:                    Action/Plan: Patient discharging home with self care. No f/u per PT/OT and no DME needs. Pt states she has a PCP (Dr Baird Cancer). Pt has insurance and transportation home. No further needs per CM.   Expected Discharge Date:  10/09/16               Expected Discharge Plan:  Home/Self Care  In-House Referral:     Discharge planning Services     Post Acute Care Choice:    Choice offered to:     DME Arranged:    DME Agency:     HH Arranged:    HH Agency:     Status of Service:  Completed, signed off  If discussed at [employer] of Stay Meetings, dates discussed:    Additional Comments:  Pollie Friar, RN 10/09/2016, 2:47 PM

## 2016-10-09 NOTE — Discharge Instructions (Signed)
Follow with your Primary MD in 7 days   Get CBC, CMP, 2 view Chest X ray checked  by Primary MD or SNF MD in 5-7 days ( we routinely change or add medications that can affect your baseline labs and fluid status, therefore we recommend that you get the mentioned basic workup next visit with your PCP, your PCP may decide not to get them or add new tests based on their clinical decision)  Activity: As tolerated with Full fall precautions use walker/cane & assistance as needed  Disposition Home    Diet:   Heart Healthy    For Heart failure patients - Check your Weight same time everyday, if you gain over 2 pounds, or you develop in leg swelling, experience more shortness of breath or chest pain, call your Primary MD immediately. Follow Cardiac Low Salt Diet and 1.5 lit/day fluid restriction.  On your next visit with your primary care physician please Get Medicines reviewed and adjusted.  Please request your Prim.MD to go over all Hospital Tests and Procedure/Radiological results at the follow up, please get all Hospital records sent to your Prim MD by signing hospital release before you go home.  If you experience worsening of your admission symptoms, develop shortness of breath, life threatening emergency, suicidal or homicidal thoughts you must seek medical attention immediately by calling 911 or calling your MD immediately  if symptoms less severe.  You Must read complete instructions/literature along with all the possible adverse reactions/side effects for all the Medicines you take and that have been prescribed to you. Take any new Medicines after you have completely understood and accpet all the possible adverse reactions/side effects.   Do not drive, operate heavy machinery, perform activities at heights, swimming or participation in water activities or provide baby sitting services if your were admitted for syncope or siezures until you have seen by Primary MD or a Neurologist and advised to  do so again.  Do not drive when taking Pain medications.    Do not take more than prescribed Pain, Sleep and Anxiety Medications  Special Instructions: If you have smoked or chewed Tobacco  in the last 2 yrs please stop smoking, stop any regular Alcohol  and or any Recreational drug use.  Wear Seat belts while driving.   Please note  You were cared for by a hospitalist during your hospital stay. If you have any questions about your discharge medications or the care you received while you were in the hospital after you are discharged, you can call the unit and asked to speak with the hospitalist on call if the hospitalist that took care of you is not available. Once you are discharged, your primary care physician will handle any further medical issues. Please note that NO REFILLS for any discharge medications will be authorized once you are discharged, as it is imperative that you return to your primary care physician (or establish a relationship with a primary care physician if you do not have one) for your aftercare needs so that they can reassess your need for medications and monitor your lab values.

## 2016-10-09 NOTE — Progress Notes (Signed)
Patient is discharged from room 5M11 at this time. Alert and in stable condition. IV site d/c'd and instructions read to patient with understanding verbalized. Left unit via wheelchair with all belongings at side.

## 2016-10-09 NOTE — Progress Notes (Signed)
Occupational Therapy Treatment Patient Details Name: Jessica Cruz MRN: [id number] DOB: 1965-10-22 Today's Date: 10/09/2016    History of present illness Pt is a 51 y.o. female who presented to the ED with inability to talk or move R side for approximately 10 minutes. She has a PMH significant for HTN, HLD, NSTEMI, prediabetes, hedaches, brain aneurysm s/p clipping in 2007. CT Left lenticular nucleus small infarct of indeterminate age   OT comments  Pt is at adequate level for d/c home. Pt currently with no acute Ot needs at this time and MOD I for all adls.    Follow Up Recommendations  No OT follow up    Equipment Recommendations  None recommended by OT    Recommendations for Other Services      Precautions / Restrictions Precautions Precautions: None Precaution Comments: Hypertensive urgency       Mobility Bed Mobility Overal bed mobility: Modified Independent                Transfers                      Balance                                           ADL either performed or assessed with clinical judgement   ADL Overall ADL's : Modified independent                                             Vision   Additional Comments: pt able to read objects in all quadrants in the room, able to scan advertisement, able to locate objects in a cluttered environment, able to draw a clock and correctly place numbers. pt reports only change currently is occassionally misspelling a word testing. pt placing letters close together on cell phone keyboard. Pt currently without visual deficits taht affect adls.    Perception     Praxis      Cognition Arousal/Alertness: Awake/alert Behavior During Therapy: WFL for tasks assessed/performed Overall Cognitive Status: Within Functional Limits for tasks assessed                                          Exercises     Shoulder Instructions       General  Comments      Pertinent Vitals/ Pain       Pain Assessment: No/denies pain  Home Living                                          Prior Functioning/Environment              Frequency  Min 2X/week        Progress Toward Goals  OT Goals(current goals can now be found in the care plan section)  Progress towards OT goals: Goals met/education completed, patient discharged from OT  Acute Rehab OT Goals Patient Stated Goal: to feel better OT Goal Formulation: With patient Time For Goal Achievement: 10/22/16 Potential to Achieve Goals: Good ADL Goals Additional ADL Goal #  1: Pt will incorporate 3 strategies to compensate for visual deficits during work related computer tasks.   Plan      Co-evaluation                 AM-PAC PT "6 Clicks" Daily Activity     Outcome Measure   Help from another person eating meals?: None Help from another person taking care of personal grooming?: None Help from another person toileting, which includes using toliet, bedpan, or urinal?: None Help from another person bathing (including washing, rinsing, drying)?: None Help from another person to put on and taking off regular upper body clothing?: None Help from another person to put on and taking off regular lower body clothing?: None 6 Click Score: 24    End of Session    OT Visit Diagnosis: Low vision, both eyes (H54.2)   Activity Tolerance Patient tolerated treatment well   Patient Left with nursing/sitter in room   Nurse Communication Mobility status    Functional Assessment Tool Used: Clinical judgement Functional Limitation: Self care Self Care Current Status (H7414): 0 percent impaired, limited or restricted Self Care Discharge Status (E3953): 0 percent impaired, limited or restricted   Time: 1355-1409 OT Time Calculation (min): 14 min  Charges: OT G-codes **NOT FOR INPATIENT CLASS** Functional Assessment Tool Used: Clinical judgement Functional  Limitation: Self care Self Care Current Status (U0233): 0 percent impaired, limited or restricted Self Care Discharge Status (I3568): 0 percent impaired, limited or restricted OT General Charges $OT Visit: 1 Procedure OT Treatments $Therapeutic Activity: 8-22 mins   Jeri Modena   OTR/L Pager: [phone] Office: [phone] .    Parke Poisson B 10/09/2016, 2:28 PM

## 2016-12-13 ENCOUNTER — Encounter: Payer: Self-pay | Admitting: Neurology

## 2016-12-13 ENCOUNTER — Ambulatory Visit (INDEPENDENT_AMBULATORY_CARE_PROVIDER_SITE_OTHER): Payer: 59 | Admitting: Neurology

## 2016-12-13 VITALS — BP 162/101 | HR 69 | Ht 65.0 in | Wt 251.0 lb

## 2016-12-13 DIAGNOSIS — G459 Transient cerebral ischemic attack, unspecified: Secondary | ICD-10-CM | POA: Diagnosis not present

## 2016-12-13 NOTE — Patient Instructions (Signed)
I had a long d/w patient about her recent strokelike presentation, prior brain aneurysm clipping, risk for recurrent stroke/TIAs, personally independently reviewed imaging studies and stroke evaluation results and answered questions.Continue aspirin 325 mg daily  for secondary stroke prevention and maintain strict control of hypertension with blood pressure goal below 130/90, diabetes with hemoglobin A1c goal below 6.5% and lipids with LDL cholesterol goal below 70 mg/dL. I also advised the patient to eat a healthy diet with plenty of whole grains, cereals, fruits and vegetables, exercise regularly and maintain ideal body weight .I also advised her to increase participation in activities for stress relaxation like meditation and yoga and to diet and exercise andweight. Followup in the future with me only as necessary and no routine schedule appointment was made

## 2016-12-13 NOTE — Progress Notes (Signed)
[facility] [address] [place]. [place] [address] [phone]       OFFICE FOLLOW-UP NOTE  Jessica. Jessica Cruz Date of Birth:  Oct 21, 1965 Medical Record Number:  [id number]   HPI: Jessica Cruz is a 70 year african american lady seen today for first office f/u visit following [facility] admission in June 2018 for stroke like episode. History is up 10 from the patient, review of electronic medical records as well as care everywhere records at [facility] reviewed as well.Jessica Cruz a 51 y.o.femalewith medical history significant of HTN, HLD, NSTEMI, prediabetes, headaches, brain aneurysm s/p clipping in 2007; who presents with complaints of inability to talk or move her right side, this happened in home and lasted for about 10 minutes. She presented initially to [facility] ER where she was found to have high blood pressure, CT head overall was nonacute.  On arrival she endorsed right sided weakness with sensory numbness, right facial droop and inability to speak, with retained ability to understand speech. Stroke Teleneurologist at OSH evaluated the patient and recommended admission for MRI and frequent neuro checks. MRI was unavailable at OSH on the weekend of 10/07/16-10/08/16, therefore patient was transferred to [facility] for further evaluation and management. CT angiogram of the brain showed no large vessel occlusion. Prior anterior communicating artery in vision status post clipping without evidence of recurrence. There was remote age small left basal ganglia infarct noted. MRI scan of the brain could not be obtained as patient could not provide reliable information about the compatibility of the aneurysm clip. Aneurysm clipping was done in 2007 and widened hospital. Transthoracic echo showed normal ejection fraction. LDL cholesterol was elevated at 170 mg percent. Hemoglobin A1c was 6.5. Patient was on aspirin 81 mg prior to Admission which was increased to 325 mg daily. Patient has significantly  elevated blood pressure on admission. Patient was counseled to be compliant with her blood pressure and cholesterol medicine. Patient states that she had recurrence of her symptoms and was seen at [facility] and at that time they did obtain clearance for her aneurysm clip being MRI compatible and she underwent MRI at [facility] which did not show stroke. I have reviewed her records in care everywhere and can find a ER visit at [facility] and plans to check MRI compatible a but I do not have an actual report of the MRI for my review today. The patient's blood pressure is still elevated today at 162/101. She states that this is due to episode of bronchitis that she's been having with frequent coughing for the last 1 month. She does complain of some palpitations but blames it on anxiety. She did have a history of heart attack in October last year. She admits to being anxious but is not on current medications but plans to see a primary care physician to start medications for anxiety. ROS:   14 system review of systems is positive for  fatigue, chest pain, palpitations, leg swelling, trouble swallowing, blurred and double vision, shortness of breath, cough, constipation, incontinence, anemia, feeling hot and cold, aching muscles, headache, weakness, slurred speech, tremor, depression, anxiety, not enough sleep, disinterest in activities, insomnia, snoring, restless legs and all other systems negative  PMH:  Past Medical History:  Diagnosis Date  . Anxiety   . Brain aneurysm    a. h/o intracranial aneurysm rupture (clipped [facility] 2007).  . Essential hypertension   . Fibroids   . Hyperlipidemia   . Hypertensive heart disease   .  Liver nodule   . Menorrhagia   . Microcytic anemia   . Non-ST elevation myocardial infarction (NSTEMI), type 2    a. demand ischemia 01/2016 in setting of accelerated HTN, anemia - normal cors by cath.  . Obesity   . Pre-diabetes   . Stroke [facility])       Social History:  Social History   Social History  . Marital status: Married    Spouse name: N/A  . Number of children: N/A  . Years of education: N/A   Occupational History  . Not on file.   Social History Main Topics  . Smoking status: Never Smoker  . Smokeless tobacco: Never Used  . Alcohol use Yes     Comment: rare  . Drug use: No  . Sexual activity: Yes    Birth control/ protection: None   Other Topics Concern  . Not on file   Social History Narrative  . No narrative on file    Medications:   Current Outpatient Prescriptions on File Prior to Visit  Medication Sig Dispense Refill  . acetaminophen (TYLENOL) 325 MG tablet Take 325-650 mg by mouth every 6 (six) hours as needed for headache.    [name] Kitchen aspirin 325 MG tablet Take 1 tablet (325 mg total) by mouth daily. 30 tablet 0  . atorvastatin (LIPITOR) 80 MG tablet Take 1 tablet (80 mg total) by mouth daily at 6 PM. 30 tablet 0  . carvedilol (COREG) 25 MG tablet Take 1 tablet (25 mg total) by mouth 2 (two) times daily with a meal. 180 tablet 1  . ferrous sulfate 325 (65 FE) MG tablet Take 1 tablet (325 mg total) by mouth 2 (two) times daily with a meal. 90 tablet 0  . hydrALAZINE (APRESOLINE) 50 MG tablet Take 1 tablet (50 mg total) by mouth every 8 (eight) hours. 90 tablet 0  . hydrochlorothiazide (MICROZIDE) 12.5 MG capsule Take 1 capsule (12.5 mg total) by mouth daily. 30 capsule 0  . lisinopril (PRINIVIL,ZESTRIL) 20 MG tablet Take 1 tablet (20 mg total) by mouth daily. 30 tablet 0   No current facility-administered medications on file prior to visit.     Allergies:   Allergies  Allergen Reactions  . Pineapple Itching and Swelling    TONGUE AND THROAT AFFECTED  . Shellfish Allergy Itching and Swelling    THROAT AND TONGUE ARE AFFECTED    Physical Exam General: Obese middle-aged African-American lady seated, in no evident distress Head: head normocephalic and atraumatic.  Neck: supple with no carotid or  supraclavicular bruits Cardiovascular: regular rate and rhythm, no murmurs Musculoskeletal: no deformity Skin:  no rash/petichiae Vascular:  Normal pulses all extremities Vitals:   12/13/16 0939  BP: (!) 162/101  Pulse: 69   Neurologic Exam Mental Status: Awake and fully alert. Oriented to place and time. Recent and remote memory intact. Attention span, concentration and fund of knowledge appropriate. Mood and affect appropriate.  Cranial Nerves: Fundoscopic exam reveals sharp disc margins. Pupils equal, briskly reactive to light. Extraocular movements full without nystagmus. Visual fields full to confrontation. Hearing intact. Facial sensation intact. Face, tongue, palate moves normally and symmetrically.  Motor: Normal bulk and tone. Normal strength in all tested extremity muscles. Sensory.: intact to touch ,pinprick .position and vibratory sensation.  Coordination: Rapid alternating movements normal in all extremities. Finger-to-nose and heel-to-shin performed accurately bilaterally. Gait and Station: Arises from chair without difficulty. Stance is normal. Gait demonstrates normal stride length and balance . Able to heel, toe and  tandem walk without difficulty.  Reflexes: 1+ and symmetric. Toes downgoing.   NIHSS  0 Modified Rankin  0   ASSESSMENT: 51 year old African-American lady with episode of transient speech difficulties and right-sided weakness in the setting of elevated high blood pressure and anxiety strokelike episode versus left hemipheric TIA but negative stroke w/u except uncontrolled risk factors.. CT imaging did not show an acute infarct. MRI was not initially obtained secondary to concerns of an aneurysm clip being MRI compatible however apparently subsequent outpatient MRI was done and did not show acute infarct. Vascular risk factors of obesity, hypertension hyperlipidemia and prior stroke and brain aneurysm status post clipping    PLAN: I had a long d/w patient  about her recent strokelike presentation, prior brain aneurysm clipping, risk for recurrent stroke/TIAs, personally independently reviewed imaging studies and stroke evaluation results and answered questions.Continue aspirin 325 mg daily  for secondary stroke prevention and maintain strict control of hypertension with blood pressure goal below 130/90, diabetes with hemoglobin A1c goal below 6.5% and lipids with LDL cholesterol goal below 70 mg/dL. I also advised the patient to eat a healthy diet with plenty of whole grains, cereals, fruits and vegetables, exercise regularly and maintain ideal body weight .I also advised her to increase participation in activities for stress relaxation like meditation and yoga and to diet and exercise andweight. Followup in the future with me only as necessary and no routine schedule appointment was made Greater than 50% of time during this 25 minute visit was spent on counseling,explanation of diagnosis hypertensive urgency, anxiety, strokelike episode, planning of further management, discussion with patient and family and coordination of care Antony Contras, MD  [facility] [address] [place], [place] [address]  Phone [phone] Fax [phone] Note: This document was prepared with digital dictation and possible smart phrase technology. Any transcriptional errors that result from this process are unintentional

## 2018-01-02 ENCOUNTER — Encounter: Payer: Self-pay | Admitting: Nurse Practitioner

## 2018-01-02 DIAGNOSIS — E1165 Type 2 diabetes mellitus with hyperglycemia: Secondary | ICD-10-CM

## 2018-01-02 DIAGNOSIS — F419 Anxiety disorder, unspecified: Secondary | ICD-10-CM | POA: Insufficient documentation

## 2018-01-02 DIAGNOSIS — Z794 Long term (current) use of insulin: Principal | ICD-10-CM

## 2018-01-17 ENCOUNTER — Ambulatory Visit: Payer: 59 | Admitting: Nurse Practitioner

## 2018-02-17 ENCOUNTER — Other Ambulatory Visit: Payer: Self-pay | Admitting: Nurse Practitioner

## 2018-02-24 ENCOUNTER — Other Ambulatory Visit: Payer: Self-pay | Admitting: Nurse Practitioner

## 2018-04-05 ENCOUNTER — Ambulatory Visit (INDEPENDENT_AMBULATORY_CARE_PROVIDER_SITE_OTHER): Payer: Self-pay | Admitting: Nurse Practitioner

## 2018-04-05 ENCOUNTER — Encounter: Payer: Self-pay | Admitting: Nurse Practitioner

## 2018-04-05 VITALS — BP 120/84 | HR 76 | Temp 98.1°F | Ht 64.6 in | Wt 246.0 lb

## 2018-04-05 DIAGNOSIS — I252 Old myocardial infarction: Secondary | ICD-10-CM

## 2018-04-05 DIAGNOSIS — R079 Chest pain, unspecified: Secondary | ICD-10-CM

## 2018-04-05 DIAGNOSIS — R7309 Other abnormal glucose: Secondary | ICD-10-CM

## 2018-04-05 DIAGNOSIS — N898 Other specified noninflammatory disorders of vagina: Secondary | ICD-10-CM

## 2018-04-05 DIAGNOSIS — I119 Hypertensive heart disease without heart failure: Secondary | ICD-10-CM

## 2018-04-05 DIAGNOSIS — Z823 Family history of stroke: Secondary | ICD-10-CM

## 2018-04-05 MED ORDER — METRONIDAZOLE 500 MG PO TABS: 500.0000 mg | ORAL_TABLET | Freq: Two times a day (BID) | ORAL | 0 refills | Status: DC

## 2018-04-05 NOTE — Progress Notes (Signed)
Subjective:     Patient ID: Jessica Cruz , female    DOB: [birth year] , 52 y.o.   MRN: [id number]   Chief Complaint  Patient presents with  . Hypertension  . Vaginal Discharge    Patient states she feels like she has BV again she has some discharge and some odor.    HPI  Hypertension  Associated symptoms include chest pain. Pertinent negatives include no palpitations or shortness of breath. There are no associated agents to hypertension. Risk factors for coronary artery disease include diabetes mellitus, dyslipidemia, obesity and sedentary lifestyle. Past treatments include beta blockers, diuretics, calcium channel blockers and lifestyle changes. Compliance problems: medications.  There is no history of angina or kidney disease. There is no history of chronic renal disease.  Chest Pain   This is a chronic problem. The current episode started more than 1 year ago. The onset quality is gradual. The problem occurs intermittently. The problem has been gradually worsening. The pain is present in the substernal region. The pain does not radiate. Pertinent negatives include no abdominal pain, cough, dizziness, fever, irregular heartbeat, nausea, palpitations, shortness of breath or syncope. The pain is aggravated by nothing. She has tried nothing for the symptoms. Risk factors include lack of exercise, sedentary lifestyle, smoking/tobacco exposure, obesity and stress.  Her past medical history is significant for aneurysm and hypertension.  Her family medical history is significant for CAD, diabetes, hyperlipidemia, hypertension, stroke and sudden death.  Vaginal Discharge  The patient's primary symptoms include a genital odor, missed menses (irregular, Nov 1 last Menstrual cycle) and vaginal discharge. The patient's pertinent negatives include no genital itching or genital rash. This is a new problem. The current episode started 1 to 4 weeks ago. The problem occurs constantly. The problem has been  unchanged. The patient is experiencing no pain. She is not pregnant. Pertinent negatives include no abdominal pain, fever or nausea. The vaginal discharge was green. There has been no bleeding. She has not been passing clots. She has not been passing tissue. Nothing aggravates the symptoms. She has tried antifungals for the symptoms. She is sexually active. No, her partner does not have an STD. She uses nothing for contraception. Her menstrual history has been irregular. Her past medical history is significant for vaginosis. There is no history of an abdominal surgery, PID or an STD.     Past Medical History:  Diagnosis Date  . Anxiety   . Brain aneurysm    a. h/o intracranial aneurysm rupture (clipped [facility] 2007).  . Essential hypertension   . Fibroids   . Hyperlipidemia   . Hypertensive heart disease   . Liver nodule   . Menorrhagia   . Microcytic anemia   . Non-ST elevation myocardial infarction (NSTEMI), type 2    a. demand ischemia 01/2016 in setting of accelerated HTN, anemia - normal cors by cath.  . Obesity   . Pre-diabetes   . Stroke [facility])      Family History  Problem Relation Age of Onset  . Heart attack Father 61  . Stroke Father   . Diabetes Mother   . Hypertension Mother   . Heart disease Maternal Grandmother   . Stroke Maternal Grandfather   . Heart attack Paternal Grandmother   . Heart disease Paternal Grandmother   . Heart failure Paternal Grandmother   . Heart failure Paternal Grandfather   . Heart disease Paternal Grandfather   . Heart attack Paternal Grandfather   . Stroke Paternal Grandfather  Current Outpatient Medications:  .  acetaminophen (TYLENOL) 325 MG tablet, Take 325-650 mg by mouth every 6 (six) hours as needed for headache., Disp: , Rfl:  .  aspirin 325 MG tablet, Take 1 tablet (325 mg total) by mouth daily., Disp: 30 tablet, Rfl: 0 .  atorvastatin (LIPITOR) 80 MG tablet, Take 1 tablet (80 mg total) by mouth daily at 6 PM. (Patient taking  differently: Take 40 mg by mouth daily at 6 PM. ), Disp: 30 tablet, Rfl: 0 .  carvedilol (COREG) 25 MG tablet, Take 1 tablet (25 mg total) by mouth 2 (two) times daily with a meal., Disp: 180 tablet, Rfl: 1 .  ferrous sulfate 325 (65 FE) MG tablet, Take 1 tablet (325 mg total) by mouth 2 (two) times daily with a meal., Disp: 90 tablet, Rfl: 0 .  gabapentin (NEURONTIN) 100 MG capsule, TAKE ONE CAPSULE EVERY DAY AT BEDTIME AS NEEDED, Disp: 90 capsule, Rfl: 1 .  hydrALAZINE (APRESOLINE) 50 MG tablet, Take 1 tablet (50 mg total) by mouth every 8 (eight) hours., Disp: 90 tablet, Rfl: 0 .  hydrOXYzine (ATARAX/VISTARIL) 25 MG tablet, Take 25 mg by mouth every 4 (four) hours as needed., Disp: , Rfl:  .  lisinopril (PRINIVIL,ZESTRIL) 20 MG tablet, Take 1 tablet (20 mg total) by mouth daily., Disp: 30 tablet, Rfl: 0 .  losartan (COZAAR) 50 MG tablet, TAKE 1 TABLET BY MOUTH EVERY DAY, Disp: 90 tablet, Rfl: 0   Allergies  Allergen Reactions  . Pineapple Itching and Swelling    TONGUE AND THROAT AFFECTED  . Shellfish Allergy Itching and Swelling    THROAT AND TONGUE ARE AFFECTED     Review of Systems  Constitutional: Negative for fever.  Respiratory: Negative for cough and shortness of breath.   Cardiovascular: Positive for chest pain. Negative for palpitations and syncope.  Gastrointestinal: Negative for abdominal pain and nausea.  Genitourinary: Positive for missed menses (irregular, Nov 1 last Menstrual cycle) and vaginal discharge.  Neurological: Negative for dizziness.     Today's Vitals   04/05/18 1138  BP: 120/84  Pulse: 76  Temp: 98.1 F (36.7 C)  TempSrc: Oral  SpO2: 93%  Weight: 246 lb (111.6 kg)  Height: 5' 4.6" (1.641 m)  PainSc: 0-No pain   Body mass index is 41.45 kg/m.   Objective:  Physical Exam Vitals signs reviewed.  Constitutional:      Appearance: Normal appearance.  Cardiovascular:     Rate and Rhythm: Normal rate and regular rhythm.     Pulses: Normal pulses.      Heart sounds: Normal heart sounds. No murmur.  Pulmonary:     Effort: Pulmonary effort is normal.     Breath sounds: Normal breath sounds.  Abdominal:     General: Abdomen is flat. Bowel sounds are normal.     Palpations: Abdomen is soft.     Hernia: There is no hernia in the right inguinal area.  Genitourinary:    Pubic Area: No rash.      Vagina: Normal. No erythema.     Adnexa:        Right: No mass.    Skin:    General: Skin is warm.  Neurological:     General: No focal deficit present.     Mental Status: She is alert.          Assessment And Plan:     1. Hypertensive heart disease without heart failure . B/P is controlled however she is extremely noncompliant  and she has had a stroke and heart attack in th past.  . CMP ordered to check renal function.  . The importance of regular exercise and dietary modification was stressed to the patient.  . Stressed importance of losing ten percent of her body weight to help with B/P control.  . The weight loss would help with decreasing cardiac and cancer risk as well.  - Ambulatory referral to Cardiology - Lipid panel  2. Chest pain, unspecified type  Intermittent chest pain  I am referring her to cardiology due to her significant history with cardiac disease  I have stressed to her she needs to be seen when she is having chest pain  - Ambulatory referral to Cardiology  3. Hx of acute myocardial infarction   4. Family hx-stroke   5. Vaginal discharge  Small amount of pale yellow vaginal discharge   Will treat with metronidazole empirically and send her swab for further evaluation of STDs - metroNIDAZOLE (FLAGYL) 500 MG tablet; Take 1 tablet (500 mg total) by mouth 2 (two) times daily.  Dispense: 14 tablet; Refill: 0 - Cervicovaginal ancillary only  6. Abnormal glucose  Chronic, controlled  Continue with current medications  Encouraged to limit intake of sugary foods and drinks  Encouraged to increase  physical activity to 150 minutes per week - Hemoglobin A1c - CBC no Diff - Lipid panel  [name], FNP

## 2018-04-05 NOTE — Patient Instructions (Signed)
   Probiotic daily - get at pharmacy

## 2018-04-06 LAB — LIPID PANEL
Chol/HDL Ratio: 6.3 ratio — ABNORMAL HIGH (ref 0.0–4.4)
Cholesterol, Total: 241 mg/dL — ABNORMAL HIGH (ref 100–199)
HDL: 38 mg/dL — ABNORMAL LOW (ref >39)
LDL Calculated: 138 mg/dL — ABNORMAL HIGH (ref 0–99)
Triglycerides: 325 mg/dL — ABNORMAL HIGH (ref 0–149)
VLDL Cholesterol Cal: 65 mg/dL — ABNORMAL HIGH (ref 5–40)

## 2018-04-06 LAB — CBC
Hematocrit: 36.2 % (ref 34.0–46.6)
Hemoglobin: 10 g/dL — ABNORMAL LOW (ref 11.1–15.9)
MCH: 18.6 pg — ABNORMAL LOW (ref 26.6–33.0)
MCHC: 27.6 g/dL — ABNORMAL LOW (ref 31.5–35.7)
MCV: 67 fL — ABNORMAL LOW (ref 79–97)
Platelets: 379 10*3/uL (ref 150–450)
RBC: 5.39 x10E6/uL — ABNORMAL HIGH (ref 3.77–5.28)
RDW: 18.3 % — ABNORMAL HIGH (ref 12.3–15.4)
WBC: 7.6 10*3/uL (ref 3.4–10.8)

## 2018-04-06 LAB — HEMOGLOBIN A1C
Est. average glucose Bld gHb Est-mCnc: 183 mg/dL
Hgb A1c MFr Bld: 8 % — ABNORMAL HIGH (ref 4.8–5.6)

## 2018-04-08 ENCOUNTER — Other Ambulatory Visit (HOSPITAL_COMMUNITY)
Admission: RE | Admit: 2018-04-08 | Discharge: 2018-04-08 | Disposition: A | Discharge status: Home / Self Care | Payer: 59 | Source: Ambulatory Visit | Attending: Nurse Practitioner | Admitting: Nurse Practitioner

## 2018-04-08 DIAGNOSIS — N898 Other specified noninflammatory disorders of vagina: Secondary | ICD-10-CM | POA: Insufficient documentation

## 2018-04-09 LAB — CERVICOVAGINAL ANCILLARY ONLY
Bacterial vaginitis: POSITIVE — AB
Chlamydia: NEGATIVE
Neisseria Gonorrhea: NEGATIVE
Trichomonas: POSITIVE — AB

## 2018-04-12 ENCOUNTER — Telehealth: Payer: Self-pay | Admitting: Nurse Practitioner

## 2018-04-12 ENCOUNTER — Other Ambulatory Visit: Payer: Self-pay | Admitting: Nurse Practitioner

## 2018-04-12 DIAGNOSIS — E119 Type 2 diabetes mellitus without complications: Secondary | ICD-10-CM

## 2018-04-12 MED ORDER — METFORMIN HCL 500 MG PO TABS: 500.0000 mg | ORAL_TABLET | Freq: Two times a day (BID) | ORAL | 3 refills | Status: DC

## 2018-04-12 NOTE — Telephone Encounter (Signed)
Called patient and given lab results.  I have advised her to have her partner checked and treated for trichomonas, she was given metronidazole at her last visit.  She is also to start metformin 500 mg BID.

## 2018-04-12 NOTE — Progress Notes (Signed)
New order for metformin sent.

## 2018-04-16 ENCOUNTER — Encounter: Payer: Self-pay | Admitting: Nurse Practitioner

## 2018-04-23 ENCOUNTER — Other Ambulatory Visit: Payer: Self-pay | Admitting: Nurse Practitioner

## 2018-04-23 MED ORDER — METRONIDAZOLE 0.75 % VA GEL: 1.0000 | Freq: Two times a day (BID) | VAGINAL | 0 refills | Status: DC

## 2018-04-24 ENCOUNTER — Telehealth: Payer: Self-pay

## 2018-04-24 NOTE — Telephone Encounter (Signed)
Patient called stating she is still having the same symptoms from her last visit the antibiotics did not clear it.  Returned pt call and advised her an Rx has been sent to the pharmacy. YRL,RMA

## 2018-04-27 ENCOUNTER — Other Ambulatory Visit: Payer: Self-pay | Admitting: Nurse Practitioner

## 2018-05-24 ENCOUNTER — Encounter

## 2018-06-03 ENCOUNTER — Other Ambulatory Visit: Payer: Self-pay | Admitting: Nurse Practitioner

## 2018-06-03 MED ORDER — GABAPENTIN 100 MG PO CAPS: ORAL_CAPSULE | ORAL | 1 refills | Status: DC

## 2018-06-03 NOTE — Progress Notes (Signed)
I sent a prescription for gabapentin to the pharmacy and discussed with her the need to go to the cardiologist, she informed me she does not have any insurance and that is why she did not go to the cardiologist with the referral.  She reports her husband will have new insurance soon.

## 2018-06-16 ENCOUNTER — Other Ambulatory Visit: Payer: Self-pay | Admitting: Internal Medicine

## 2018-06-16 ENCOUNTER — Other Ambulatory Visit: Payer: Self-pay | Admitting: Nurse Practitioner

## 2018-06-21 ENCOUNTER — Other Ambulatory Visit: Payer: Self-pay | Admitting: Nurse Practitioner

## 2018-07-02 ENCOUNTER — Ambulatory Visit: Payer: Self-pay | Admitting: Nurse Practitioner

## 2018-07-05 ENCOUNTER — Ambulatory Visit: Payer: Self-pay | Admitting: Internal Medicine

## 2018-07-05 ENCOUNTER — Ambulatory Visit: Payer: Self-pay | Admitting: Nurse Practitioner

## 2018-07-25 ENCOUNTER — Other Ambulatory Visit: Payer: Self-pay | Admitting: Nurse Practitioner

## 2018-07-29 ENCOUNTER — Telehealth: Payer: Self-pay

## 2018-07-29 NOTE — Telephone Encounter (Signed)
Pharmacy sent over a refill request for Atorvastatin and I am unable to send it due to  patient not keeping her 3  month follow up attempted to call pt to schedule an appointment left v/m to call office. YRL,RMA

## 2018-07-30 ENCOUNTER — Other Ambulatory Visit: Payer: Self-pay | Admitting: Nurse Practitioner

## 2018-08-11 ENCOUNTER — Other Ambulatory Visit: Payer: Self-pay | Admitting: Nurse Practitioner

## 2018-08-23 ENCOUNTER — Other Ambulatory Visit: Payer: Self-pay | Admitting: Nurse Practitioner

## 2018-08-23 ENCOUNTER — Telehealth: Payer: Self-pay

## 2018-08-23 NOTE — Telephone Encounter (Signed)
Called pt to schedule her a f/u for her DM left her a v/m to call office back. Rx has been sent for 30 days no more refills until she comes in for appt. YRL,RMA

## 2018-09-12 ENCOUNTER — Encounter: Payer: Self-pay | Admitting: Nurse Practitioner

## 2018-09-12 ENCOUNTER — Other Ambulatory Visit: Payer: Self-pay

## 2018-09-12 ENCOUNTER — Other Ambulatory Visit (HOSPITAL_COMMUNITY)
Admission: RE | Admit: 2018-09-12 | Discharge: 2018-09-12 | Disposition: A | Discharge status: Home / Self Care | Payer: Self-pay | Source: Ambulatory Visit | Attending: Nurse Practitioner | Admitting: Nurse Practitioner

## 2018-09-12 ENCOUNTER — Ambulatory Visit: Payer: Self-pay | Admitting: Nurse Practitioner

## 2018-09-12 VITALS — BP 126/80 | HR 90 | Temp 98.1°F | Ht 64.8 in | Wt 246.4 lb

## 2018-09-12 DIAGNOSIS — M62838 Other muscle spasm: Secondary | ICD-10-CM

## 2018-09-12 DIAGNOSIS — I119 Hypertensive heart disease without heart failure: Secondary | ICD-10-CM

## 2018-09-12 DIAGNOSIS — N898 Other specified noninflammatory disorders of vagina: Secondary | ICD-10-CM

## 2018-09-12 DIAGNOSIS — E119 Type 2 diabetes mellitus without complications: Secondary | ICD-10-CM

## 2018-09-12 MED ORDER — GABAPENTIN 100 MG PO CAPS: ORAL_CAPSULE | ORAL | 1 refills | Status: DC

## 2018-09-12 MED ORDER — LOSARTAN POTASSIUM 50 MG PO TABS: 50.0000 mg | ORAL_TABLET | Freq: Every day | ORAL | 0 refills | Status: DC

## 2018-09-12 MED ORDER — METRONIDAZOLE 500 MG PO TABS: 500.0000 mg | ORAL_TABLET | Freq: Three times a day (TID) | ORAL | 0 refills | Status: DC

## 2018-09-12 MED ORDER — METRONIDAZOLE 500 MG PO TABS: 500.0000 mg | ORAL_TABLET | Freq: Two times a day (BID) | ORAL | 0 refills | Status: AC

## 2018-09-12 MED ORDER — CYCLOBENZAPRINE HCL 10 MG PO TABS: 10.0000 mg | ORAL_TABLET | Freq: Three times a day (TID) | ORAL | 0 refills | Status: DC | PRN

## 2018-09-12 MED ORDER — ATORVASTATIN CALCIUM 40 MG PO TABS: 40.0000 mg | ORAL_TABLET | Freq: Every day | ORAL | 0 refills | Status: DC

## 2018-09-12 NOTE — Progress Notes (Signed)
Subjective:     Patient ID: Jessica Cruz , female    DOB: Jul 29, 1965 , 53 y.o.   MRN: [id number]   Chief Complaint  Patient presents with  . Vaginal Discharge    patient states she has some discharge and vaginal odor she denies any discomfort such as itching or burning while urinating. patient states her symptoms have been there for the past 3 weeks    HPI  Vaginal Discharge  The patient's primary symptoms include vaginal discharge (yellow green). This is a recurrent problem. The current episode started 1 to 4 weeks ago. The problem occurs constantly. She is not pregnant. Pertinent negatives include no abdominal pain or headaches. Vaginal discharge characteristics: green yellow discharge. There has been no bleeding. Nothing aggravates the symptoms. She has tried antifungals for the symptoms.     Past Medical History:  Diagnosis Date  . Anxiety   . Brain aneurysm    a. h/o intracranial aneurysm rupture (clipped [facility] 2007).  . Essential hypertension   . Fibroids   . Hyperlipidemia   . Hypertensive heart disease   . Liver nodule   . Menorrhagia   . Microcytic anemia   . Non-ST elevation myocardial infarction (NSTEMI), type 2    a. demand ischemia 01/2016 in setting of accelerated HTN, anemia - normal cors by cath.  . Obesity   . Pre-diabetes   . Stroke [facility])      Family History  Problem Relation Age of Onset  . Heart attack Father 67  . Stroke Father   . Diabetes Mother   . Hypertension Mother   . Heart disease Maternal Grandmother   . Stroke Maternal Grandfather   . Heart attack Paternal Grandmother   . Heart disease Paternal Grandmother   . Heart failure Paternal Grandmother   . Heart failure Paternal Grandfather   . Heart disease Paternal Grandfather   . Heart attack Paternal Grandfather   . Stroke Paternal Grandfather      Current Outpatient Medications:  .  acetaminophen (TYLENOL) 650 MG CR tablet, Take 650 mg by mouth 2 (two) times daily as needed for pain.,  Disp: , Rfl:  .  aspirin 325 MG tablet, Take 1 tablet (325 mg total) by mouth daily., Disp: 30 tablet, Rfl: 0 .  atorvastatin (LIPITOR) 40 MG tablet, TAKE 1 TABLET BY MOUTH EVERY DAY, Disp: 30 tablet, Rfl: 0 .  carvedilol (COREG) 12.5 MG tablet, TAKE 1 TABLET BY MOUTH TWICE A DAY WITH FOOD, Disp: 60 tablet, Rfl: 5 .  ergocalciferol (VITAMIN D2) 1.25 MG (50000 UT) capsule, Take 50,000 Units by mouth 2 (two) times a week., Disp: , Rfl:  .  ferrous sulfate 325 (65 FE) MG tablet, Take 1 tablet (325 mg total) by mouth 2 (two) times daily with a meal., Disp: 90 tablet, Rfl: 0 .  gabapentin (NEURONTIN) 100 MG capsule, Take 2 tablets at bedtime, Disp: 180 capsule, Rfl: 1 .  hydrALAZINE (APRESOLINE) 50 MG tablet, TAKE 1 TABLET BY MOUTH EVERY 8 HOURS, Disp: 270 tablet, Rfl: 0 .  hydrOXYzine (ATARAX/VISTARIL) 25 MG tablet, Take 25 mg by mouth every 4 (four) hours as needed., Disp: , Rfl:  .  losartan (COZAAR) 50 MG tablet, TAKE 1 TABLET BY MOUTH EVERY DAY, Disp: 90 tablet, Rfl: 0   Allergies  Allergen Reactions  . Pineapple Itching and Swelling    TONGUE AND THROAT AFFECTED  . Shellfish Allergy Itching and Swelling    THROAT AND TONGUE ARE AFFECTED  Review of Systems  Constitutional: Negative.   Respiratory: Negative.   Cardiovascular: Negative.   Gastrointestinal: Negative for abdominal pain.  Genitourinary: Positive for vaginal discharge (yellow green).  Neurological: Negative for dizziness and headaches.     Today's Vitals   09/12/18 1054  BP: 126/80  Pulse: 90  Temp: 98.1 F (36.7 C)  TempSrc: Oral  Weight: 246 lb 6.4 oz (111.8 kg)  Height: 5' 4.8" (1.646 m)  PainSc: 6   PainLoc: Back   Body mass index is 41.26 kg/m.   Objective:  Physical Exam Vitals signs reviewed.  Constitutional:      Appearance: Normal appearance.  Cardiovascular:     Rate and Rhythm: Normal rate and regular rhythm.     Pulses: Normal pulses.     Heart sounds: Normal heart sounds. No murmur.   Genitourinary:    Vagina: Vaginal discharge (green yellow discharge present to vaginal vault) present.  Musculoskeletal:        General: Tenderness (flank area muscles) present.  Skin:    General: Skin is warm and dry.  Neurological:     General: No focal deficit present.     Mental Status: She is alert and oriented to person, place, and time.  Psychiatric:        Mood and Affect: Mood normal.        Behavior: Behavior normal.        Thought Content: Thought content normal.        Judgment: Judgment normal.         Assessment And Plan:     1. Type 2 diabetes mellitus without complication, without long-term current use of insulin (HCC)  She has not been taking metformin due to 'her mother and sister were unable to tolerate  She is willing to take metformin currently no insurance so is a challenge to get her a different medication that may not cause as much GI upset  Will recheck HgbA1c today as well - Hemoglobin A1c - CMP14 + Anion Gap  2. Hypertensive heart disease without heart failure  Chronic  controlled - CMP14 + Anion Gap - Lipid Profile - losartan (COZAAR) 50 MG tablet; Take 1 tablet (50 mg total) by mouth daily.  Dispense: 90 tablet; Refill: 0 - atorvastatin (LIPITOR) 40 MG tablet; Take 1 tablet (40 mg total) by mouth daily.  Dispense: 90 tablet; Refill: 0  3. Muscle spasm  Low back spasm will treat with cyclobenzaprine  Advised to avoid driving or operating heavy machinery while taking - cyclobenzaprine (FLEXERIL) 10 MG tablet; Take 1 tablet (10 mg total) by mouth 3 (three) times daily as needed for muscle spasms.  Dispense: 30 tablet; Refill: 0  4. Vaginal discharge  Yellow green discharge will treat with metronidazole - Cytology (oral, anal, urethral) ancillary only - metroNIDAZOLE (FLAGYL) 500 MG tablet; Take 1 tablet (500 mg total) by mouth 3 (three) times daily for 10 days.  Dispense: 20 tablet; Refill: 0   [name], FNP    THE PATIENT IS  ENCOURAGED TO PRACTICE SOCIAL DISTANCING DUE TO THE COVID-19 PANDEMIC.

## 2018-09-12 NOTE — Patient Instructions (Signed)

## 2018-09-13 ENCOUNTER — Encounter: Payer: Self-pay | Admitting: Nurse Practitioner

## 2018-09-13 ENCOUNTER — Ambulatory Visit: Payer: Self-pay

## 2018-09-13 DIAGNOSIS — I119 Hypertensive heart disease without heart failure: Secondary | ICD-10-CM

## 2018-09-13 DIAGNOSIS — E119 Type 2 diabetes mellitus without complications: Secondary | ICD-10-CM

## 2018-09-13 LAB — CMP14 + ANION GAP
ALT: 24 IU/L (ref 0–32)
AST: 21 IU/L (ref 0–40)
Albumin/Globulin Ratio: 1.2 (ref 1.2–2.2)
Albumin: 3.9 g/dL (ref 3.8–4.8)
Alkaline Phosphatase: 89 IU/L (ref 39–117)
Anion Gap: 17 mmol/L (ref 10.0–18.0)
BUN/Creatinine Ratio: 15 (ref 9–23)
BUN: 12 mg/dL (ref 6–24)
Bilirubin Total: 0.3 mg/dL (ref 0.0–1.2)
CO2: 21 mmol/L (ref 20–29)
Calcium: 9.6 mg/dL (ref 8.7–10.2)
Chloride: 100 mmol/L (ref 96–106)
Creatinine, Ser: 0.81 mg/dL (ref 0.57–1.00)
GFR calc Af Amer: 98 mL/min/{1.73_m2} (ref >59)
GFR calc non Af Amer: 85 mL/min/{1.73_m2} (ref >59)
Globulin, Total: 3.3 g/dL (ref 1.5–4.5)
Glucose: 220 mg/dL — ABNORMAL HIGH (ref 65–99)
Potassium: 4.6 mmol/L (ref 3.5–5.2)
Sodium: 138 mmol/L (ref 134–144)
Total Protein: 7.2 g/dL (ref 6.0–8.5)

## 2018-09-13 LAB — HEMOGLOBIN A1C
Est. average glucose Bld gHb Est-mCnc: 183 mg/dL
Hgb A1c MFr Bld: 8 % — ABNORMAL HIGH (ref 4.8–5.6)

## 2018-09-13 LAB — LIPID PANEL
Chol/HDL Ratio: 5.8 ratio — ABNORMAL HIGH (ref 0.0–4.4)
Cholesterol, Total: 238 mg/dL — ABNORMAL HIGH (ref 100–199)
HDL: 41 mg/dL (ref >39)
LDL Calculated: 135 mg/dL — ABNORMAL HIGH (ref 0–99)
Triglycerides: 311 mg/dL — ABNORMAL HIGH (ref 0–149)
VLDL Cholesterol Cal: 62 mg/dL — ABNORMAL HIGH (ref 5–40)

## 2018-09-13 NOTE — Chronic Care Management (AMB) (Signed)
  Chronic Care Management   Outreach Note  09/13/2018 Name: Jessica Cruz MRN: [id number] DOB: June 03, 1965  Referred by: [name] FNP Reason for referral : Care Coordination   An unsuccessful telephone outreach was attempted today. The patient was referred to the case management team by for assistance with care coordination of resource needs.   Follow Up Plan: A HIPPA compliant phone message was left for the patient providing contact information and requesting a return call.  The care management team will reach out to the patient again over the next 7-10 days.   [name], BSW, CDP Social Worker, Certified Dementia Practitioner [place] [facility] [place] [facility] [phone]  Total time spent performing care coordination and/or care management activities with the patient by phone or face to face = 3 minutes.

## 2018-09-17 ENCOUNTER — Ambulatory Visit: Payer: Self-pay

## 2018-09-17 DIAGNOSIS — E119 Type 2 diabetes mellitus without complications: Secondary | ICD-10-CM

## 2018-09-17 DIAGNOSIS — I252 Old myocardial infarction: Secondary | ICD-10-CM

## 2018-09-17 DIAGNOSIS — I119 Hypertensive heart disease without heart failure: Secondary | ICD-10-CM

## 2018-09-17 DIAGNOSIS — G459 Transient cerebral ischemic attack, unspecified: Secondary | ICD-10-CM

## 2018-09-17 LAB — CYTOLOGY, (ORAL, ANAL, URETHRAL) ANCILLARY ONLY
Chlamydia: NEGATIVE
Neisseria Gonorrhea: NEGATIVE

## 2018-09-17 NOTE — Chronic Care Management (AMB) (Signed)
  Care Management   Follow Up Note   09/17/2018 Name: TAJAE MAIOLO MRN: [id number] DOB: June 13, 1965  Referred by: Glendale Chard, MD Reason for referral : Care Coordination (CM RN CM Case Collaboration )   Chronic Care Management   Initial Visit Note  09/17/2018 Name: KATALIA CHOMA MRN: [id number] DOB: [birth year]  Referred by: Glendale Chard, MD Reason for referral : Care Coordination (CM RN CM Case Collaboration )   MADILYNN MONTANTE is a 53 y.o. year old female who is a primary care patient of Glendale Chard, MD. The care management team was consulted for assistance with chronic disease management and care coordination needs.   Review of patient status, including review of consultants reports, relevant laboratory and other test results, and collaboration with appropriate care team members and the patient's provider was performed as part of comprehensive patient evaluation and provision of chronic care management services.    I initiated and established the plan of care for [name] during one on one collaboration with my clinical care management colleague [name] BSW who is also engaged with this patient to address social work needs.   Goals Addressed    . Assist with disease management and Care Coordination needs       Current Barriers:  [name] Kitchen Knowledge Barriers related to resources and support available to address needs related to Disease Management and Community Resource needs  Case Manager Clinical Goal(s):  [name] Kitchen Over the next 30 days, patient will work with BSW to address needs related to chronic disease management, Care Coordination and [employer] needs  Interventions:  . Collaborated with BSW and initiated plan of care to address needs related to [facility] and Care Coordination   Patient Self Care Activities:  . Self administers medications as prescribed . Attends all scheduled provider appointments . Calls pharmacy for medication refills . Calls provider  office for new concerns or questions  Initial goal documentation       Telephone follow up appointment with care management team member scheduled for: 09/26/18  Barb Merino, [facility] Care Management Coordinator [place] [facility]  Direct Phone: [phone]

## 2018-09-17 NOTE — Patient Instructions (Signed)
Visit Information  Goals Addressed            This Visit's Progress     Patient Stated   . "I can not pay for food" (pt-stated)       Current Barriers:  . Financial constraints due to being out of work from [place]):  [name] Kitchen Over the next 45 days, client will work with SW to address concerns related to food insecurity  Interventions: . Patient interviewed and appropriate assessments performed . Provided patient with information about community resources such as food pantries and assistance programs . Discussed plans with patient for ongoing care management follow up and provided patient with direct contact information for care management team  . Determined the patient has a group of friends she relies on to assist with meals when she runs out of food . Assessed the patients interest in applying for assistance through [employer] - the patient is agreeable to completing an application . Mailed the patient a Food and Nutrition application as well as a list of local food pantries  Patient Self Care Activities:  . Performs ADL's independently . Performs IADL's independently . Calls provider office for new concerns or questions  Initial goal documentation     . "I can't afford to buy my medicine" (pt-stated)       Current Barriers:  . Financial constraints  . No insurance  Clinical Social Work Clinical Goal(s):  [name] Kitchen Over the next 30 days, patient will work with embedded Pharm D to address needs related to medication assistance  Interventions: . Patient interviewed and appropriate assessments performed . Discussed plans with patient for ongoing care management follow up and provided patient with direct contact information for care management team  . Collaboration with embedded Pharm D Jessica Cruz regarding barriers for patient to obtain medications   Patient Self Care Activities:  . Performs ADL's independently . Calls  provider office for new concerns or questions  Initial goal documentation     . "I need to see a cardiologist" (pt-stated)       Current Barriers:  . Financial constraints - the patient is out of work due to [place] . Lack of insurance  Clinical Social Work Clinical Goal(s):  [name] Kitchen Over the next 30 days, client will work with SW to address concerns related to financial resources  Interventions: . Patient interviewed and appropriate assessments performed . Discussed plans with patient for ongoing care management follow up and provided patient with direct contact information for care management team  . Assessed for patients eligibility to apply for Medicaid - the patient is not disabled and does not meet any qualifiers to be eligible for Medicaid coverage . Discussed option to privately pay for insurance - determined that even when the patient is able to work she is unable to afford extra costs . Collaboration with [facility] via e-mail regarding patient eligibility  Patient Self Care Activities:  . Attends all scheduled provider appointments . Performs ADL's independently . Calls provider office for new concerns or questions  Initial goal documentation        Jessica Cruz was given information about Care Management services today including:  1. Care Management services include personalized support from designated clinical staff supervised by her physician, including individualized plan of care and coordination with other care providers 2. 24/7 contact phone numbers for assistance for urgent and routine care needs. 3. The patient may stop CCM  services at any time (effective at the end of the month) by phone call to the office staff.  Patient agreed to services and verbal consent obtained.   The patient verbalized understanding of instructions provided today and declined a print copy of patient instruction materials.   SW will follow up with the patient over the  next two weeks  Jessica Cruz, BSW, CDP Social Worker, Certified Dementia Practitioner [place] [facility] [place] [facility] [phone]

## 2018-09-17 NOTE — Chronic Care Management (AMB) (Signed)
Chronic Care Management    Clinical Social Work General Note  09/17/2018 Name: Jessica Cruz MRN: [id number] DOB: [birth year]  Jessica Cruz is a 53 y.o. year old female who is a primary care patient of [name], [place]. The care management team was consulted to assist the patient with Food Insecurity and [employer].  Jessica Cruz was given information about Care Management services today including:  1. Care Management services includes personalized support from designated clinical staff supervised by her physician, including individualized plan of care and coordination with other care providers 2. 24/7 contact phone numbers for assistance for urgent and routine care needs. 3. The patient may stop case management services at any time by phone call to the office staff.  Patient agreed to services and verbal consent obtained.    Review of patient status, including review of consultants reports, relevant laboratory and other test results, and collaboration with appropriate care team members and the patient's provider was performed as part of comprehensive patient evaluation and provision of chronic care management services.    SDOH (Social Determinants of Health) screening performed today. See Care Plan Entry related to challenges with: Financial Strain  Food Insecurity   Goals Addressed            This Visit's Progress     Patient Stated   . "I can not pay for food" (pt-stated)       Current Barriers:  . Financial constraints due to being out of work from [place]):  [name] Kitchen Over the next 45 days, client will work with SW to address concerns related to food insecurity  Interventions: . Patient interviewed and appropriate assessments performed . Provided patient with information about community resources such as food pantries and assistance programs . Discussed plans with patient for ongoing care management follow up and provided patient with  direct contact information for care management team  . Determined the patient has a group of friends she relies on to assist with meals when she runs out of food . Assessed the patients interest in applying for assistance through [employer] - the patient is agreeable to completing an application . Mailed the patient a Food and Nutrition application as well as a list of local food pantries  Patient Self Care Activities:  . Performs ADL's independently . Performs IADL's independently . Calls provider office for new concerns or questions  Initial goal documentation     . "I can't afford to buy my medicine" (pt-stated)       Current Barriers:  . Financial constraints  . No insurance  Clinical Social Work Clinical Goal(s):  [name] Kitchen Over the next 30 days, patient will work with embedded Pharm D to address needs related to medication assistance  Interventions: . Patient interviewed and appropriate assessments performed . Discussed plans with patient for ongoing care management follow up and provided patient with direct contact information for care management team  . Collaboration with embedded Pharm D Jessica Cruz regarding barriers for patient to obtain medications   Patient Self Care Activities:  . Performs ADL's independently . Calls provider office for new concerns or questions  Initial goal documentation     . "I need to see a cardiologist" (pt-stated)       Current Barriers:  . Financial constraints - the patient is out of work due to [place] [address] . Lack of insurance  Clinical Social Work Clinical Goal(s):  [name] Kitchen Over the next  30 days, client will work with SW to address concerns related to financial resources  Interventions: . Patient interviewed and appropriate assessments performed . Discussed plans with patient for ongoing care management follow up and provided patient with direct contact information for care management team  . Assessed for patients eligibility to  apply for Medicaid - the patient is not disabled and does not meet any qualifiers to be eligible for Medicaid coverage . Discussed option to privately pay for insurance - determined that even when the patient is able to work she is unable to afford extra costs . Collaboration with [facility] via e-mail regarding patient eligibility  Patient Self Care Activities:  . Attends all scheduled provider appointments . Performs ADL's independently . Calls provider office for new concerns or questions  Initial goal documentation        Follow Up Plan: SW will follow up with patient by phone over the next 10-14 days       Jessica Cruz, BSW, CDP Social Worker, Certified Dementia Practitioner [place] [facility] [place] [facility] [phone]  Total time spent performing care coordination and/or care management activities with the patient by phone or face to face = 25 minutes.

## 2018-09-18 ENCOUNTER — Ambulatory Visit: Payer: Self-pay | Admitting: Pharmacist

## 2018-09-18 ENCOUNTER — Other Ambulatory Visit: Payer: Self-pay

## 2018-09-18 DIAGNOSIS — I119 Hypertensive heart disease without heart failure: Secondary | ICD-10-CM

## 2018-09-18 DIAGNOSIS — E119 Type 2 diabetes mellitus without complications: Secondary | ICD-10-CM

## 2018-09-18 MED ORDER — HYDRALAZINE HCL 50 MG PO TABS: 50.0000 mg | ORAL_TABLET | Freq: Three times a day (TID) | ORAL | 0 refills | Status: DC

## 2018-09-18 MED ORDER — GABAPENTIN 100 MG PO CAPS: ORAL_CAPSULE | ORAL | 1 refills | Status: DC

## 2018-09-18 MED ORDER — ATORVASTATIN CALCIUM 40 MG PO TABS: 40.0000 mg | ORAL_TABLET | Freq: Every day | ORAL | 0 refills | Status: DC

## 2018-09-18 MED ORDER — CARVEDILOL 12.5 MG PO TABS: 12.5000 mg | ORAL_TABLET | Freq: Two times a day (BID) | ORAL | 1 refills | Status: DC

## 2018-09-18 MED ORDER — LOSARTAN POTASSIUM 50 MG PO TABS: 50.0000 mg | ORAL_TABLET | Freq: Every day | ORAL | 0 refills | Status: DC

## 2018-09-20 ENCOUNTER — Telehealth: Payer: Self-pay

## 2018-09-20 NOTE — Telephone Encounter (Signed)
-----   Message from [name], [place] sent at 09/19/2018 10:51 PM EDT ----- We will have to follow up on the trichomonas because I do not see it in the results however I have treated her as a precaution I definitely would have her to get her partner tested as well. Please call lab to see if results are pending

## 2018-09-20 NOTE — Telephone Encounter (Signed)
Left message. Provider will followup on lab results. And her partner needs to be tested as well

## 2018-09-23 ENCOUNTER — Telehealth: Payer: Self-pay

## 2018-09-23 ENCOUNTER — Ambulatory Visit: Payer: Self-pay

## 2018-09-23 DIAGNOSIS — I119 Hypertensive heart disease without heart failure: Secondary | ICD-10-CM

## 2018-09-23 DIAGNOSIS — E119 Type 2 diabetes mellitus without complications: Secondary | ICD-10-CM

## 2018-09-23 NOTE — Patient Instructions (Signed)
Visit Information  Goals Addressed            This Visit's Progress     Patient Stated   . "I can't afford to buy my medicine" (pt-stated)       Current Barriers:  . Financial constraints  . No insurance  Clinical Social Work Clinical Goal(s):  [name] Kitchen Over the next 30 days, patient will work with embedded Pharm D to address needs related to medication assistance  Interventions: . Patient interviewed and appropriate assessments performed . Discussed plans with patient for ongoing care management follow up and provided patient with direct contact information for care management team  . Collaboration with embedded Pharm D [name] regarding barriers for patient to obtain medications . 09/17/18-->[employer]ovo [employer]isk patient assistance application for [employer] to patient per preference.  Patient is agreeable to once weekly DM injectable. . All medications refilled and called into local [facility] for affordability and patient preference.   Patient Self Care Activities:  . Performs ADL's independently . Calls provider office for new concerns or questions  Initial goal documentation     . I need to control my diabetes (pt-stated)       Current Barriers:  [name] Kitchen Knowledge Deficits related to diabetes-friendly diet . Non Adherence to prescribed medication regimen  Pharmacist Clinical Goal(s):  [name] Kitchen Over the next 30 days, patient will demonstrate Improved medication adherence as evidenced by taking medications as prescribed . Over the next 30 days, patient will work with PharmD & PCP to address needs related to optimized medication management of chronic conditions.  Interventions: . Comprehensive medication review performed. . Collaboration with [facility] to call in refills ($4 list) community pharmacy  . Collaboration with provider re: medication management.  PCP would like to start with GLP-1 (Ozempic).  Patient has not tolerated metformin in the past (diarrhea), however is willing to try Ozempic  and apply for financial assistance. . Recently, A1c has increased to 8.0 (was controlled at 6.5 in 2018, then 8.0 in 03/2018).  Patient motivated to try new medication and work to improve lifestyle.  Will continue follow medications and application process.  Patient Self Care Activities:  . Self administers medications as prescribed  Initial goal documentation        The patient verbalized understanding of instructions provided today and declined a print copy of patient instruction materials.   The care management team will reach out to the patient again over the next 21 days.   Jessica Cruz, PharmD, BCPS Clinical Pharmacist, [facility] [place]: [phone]

## 2018-09-23 NOTE — Progress Notes (Signed)
  Chronic Care Management   Initial Visit Note  09/18/2018 Name: Jessica Cruz MRN: [id number] DOB: Sep 02, 1965  Referred by: Glendale Chard, MD Reason for referral : No chief complaint on file.   Jessica Cruz is a 53 y.o. year old female who is a primary care patient of Glendale Chard, MD. The CCM team was consulted for assistance with chronic disease management and care coordination needs.   Review of patient status, including review of consultants reports, relevant laboratory and other test results, and collaboration with appropriate care team members and the patient's provider was performed as part of comprehensive patient evaluation and provision of chronic care management services.    I spoke with Jessica Cruz by telephone today.  Objective:   Goals Addressed            This Visit's Progress     Patient Stated   . "I can't afford to buy my medicine" (pt-stated)       Current Barriers:  . Financial constraints  . No insurance  Clinical Social Work Clinical Goal(s):  [name] Kitchen Over the next 30 days, patient will work with embedded Pharm D to address needs related to medication assistance  Interventions: . Patient interviewed and appropriate assessments performed . Discussed plans with patient for ongoing care management follow up and provided patient with direct contact information for care management team  . Collaboration with embedded Pharm D [name] regarding barriers for patient to obtain medications . 09/17/18-->Novo Nordisk patient assistance application for [employer] to patient per preference.  Patient is agreeable to once weekly DM injectable. . All medications refilled and called into local [facility] for affordability and patient preference.   Patient Self Care Activities:  . Performs ADL's independently . Calls provider office for new concerns or questions  Initial goal documentation     . I need to control my diabetes (pt-stated)       Current Barriers:  [name] Kitchen  Knowledge Deficits related to diabetes-friendly diet . Non Adherence to prescribed medication regimen  Pharmacist Clinical Goal(s):  [name] Kitchen Over the next 30 days, patient will demonstrate Improved medication adherence as evidenced by taking medications as prescribed . Over the next 30 days, patient will work with PharmD & PCP to address needs related to optimized medication management of chronic conditions.  Interventions: . Comprehensive medication review performed. . Collaboration with [facility] to call in refills ($4 list) community pharmacy  . Collaboration with provider re: medication management.  PCP would like to start with GLP-1 (Ozempic).  Patient has not tolerated metformin in the past (diarrhea), however is willing to try Ozempic and apply for financial assistance. . Recently, A1c has increased to 8.0 (was controlled at 6.5 in 2018, then 8.0 in 03/2018).  Patient motivated to try new medication and work to improve lifestyle.  Will continue follow medications and application process.  Patient Self Care Activities:  . Self administers medications as prescribed  Initial goal documentation        Plan:   The care management team will reach out to the patient again over the next 21 days.   Regina Eck, PharmD, BCPS Clinical Pharmacist, [facility] [place]: [phone]

## 2018-09-23 NOTE — Chronic Care Management (AMB) (Signed)
  Chronic Care Management   Outreach Note  09/23/2018 Name: Jessica Cruz MRN: [id number] DOB: May 15, 1965  Referred by: [name], FNP Reason for referral : [place] with [facility] patient accounting to determine options for patient in regards to cardiology services. CCM SW placed  unsuccessful telephone outreach to the patient to discuss options. The patient was referred to the case management team by for assistance with chronic care management and care coordination.   Follow Up Plan: A HIPPA compliant phone message was left for the patient providing contact information and requesting a return call.  The care management team will reach out to the patient again over the next 7-10 days.   [name], BSW, CDP Social Worker, Certified Dementia Practitioner [place] [facility] [place] [facility] [phone]  Total time spent performing care coordination and/or care management activities with the patient by phone or face to face = 10 minutes.

## 2018-09-26 ENCOUNTER — Telehealth: Payer: Self-pay

## 2018-09-30 ENCOUNTER — Telehealth: Payer: Self-pay

## 2018-09-30 ENCOUNTER — Ambulatory Visit: Payer: Self-pay

## 2018-09-30 DIAGNOSIS — E119 Type 2 diabetes mellitus without complications: Secondary | ICD-10-CM

## 2018-09-30 DIAGNOSIS — I119 Hypertensive heart disease without heart failure: Secondary | ICD-10-CM

## 2018-09-30 NOTE — Chronic Care Management (AMB) (Signed)
  Care Management   Outreach Note  09/30/2018 Name: Jessica Cruz MRN: [id number] DOB: 06-21-1965   Referred by: [name], FNP Reason for referral : Care Coordination   A second unsuccessful telephone outreach was attempted today. The patient was referred to the case management team for assistance with chronic care management and care coordination.   Follow Up Plan: A HIPPA compliant phone message was left for the patient providing contact information and requesting a return call.  The care management team will reach out to the patient again over the next 10 days.   [name], BSW, CDP Social Worker, Certified Dementia Practitioner [place] [facility] [place] [facility] [phone]

## 2018-10-03 ENCOUNTER — Ambulatory Visit: Payer: Self-pay

## 2018-10-03 DIAGNOSIS — E119 Type 2 diabetes mellitus without complications: Secondary | ICD-10-CM

## 2018-10-03 DIAGNOSIS — I119 Hypertensive heart disease without heart failure: Secondary | ICD-10-CM

## 2018-10-03 NOTE — Chronic Care Management (AMB) (Signed)
  Chronic Care Management   Outreach Note  10/03/2018 Name: Jessica Cruz MRN: [id number] DOB: 01-24-1966  Referred by: Glendale Chard, MD Reason for referral : Care Coordination (INITIAL CM RNCM Telephone Outreach)   An unsuccessful telephone outreach was attempted today. The patient was referred to the case management team by [name] FNP for assistance with chronic care management and care coordination.   Follow Up Plan: A HIPPA compliant phone message was left for the patient providing contact information and requesting a return call.  The care management team will reach out to the patient again over the next 5-7 days.    Barb Merino, RN, BSN, CCM Care Management Coordinator [place] [facility]  Direct Phone: [phone]

## 2018-10-09 ENCOUNTER — Ambulatory Visit: Payer: Self-pay

## 2018-10-09 DIAGNOSIS — I252 Old myocardial infarction: Secondary | ICD-10-CM

## 2018-10-09 DIAGNOSIS — I119 Hypertensive heart disease without heart failure: Secondary | ICD-10-CM

## 2018-10-09 DIAGNOSIS — E119 Type 2 diabetes mellitus without complications: Secondary | ICD-10-CM

## 2018-10-09 NOTE — Chronic Care Management (AMB) (Signed)
Care Management   Initial Visit Note  10/09/2018 Name: Jessica Cruz MRN: [id number] DOB: [birth year]  Subjective: "I still don't have a job or health insurance"  Objective: Lab Results  Component Value Date   HGBA1C 8.0 (H) 09/12/2018   HGBA1C 8.0 (H) 04/05/2018   HGBA1C 6.5 (H) 10/08/2016   Lab Results  Component Value Date   LDLCALC 135 (H) 09/12/2018   CREATININE 0.81 09/12/2018   BP Readings from Last 3 Encounters:  09/12/18 126/80  04/05/18 120/84  12/06/17 (!) 144/80    Assessment: Jessica Cruz is a 53 y.o. year old female who sees Glendale Chard, MD for primary care. The care management team was consulted for assistance with care management and care coordination needs related to Disease Management Educational Needs Medication Management and Education Medication Assistance .   Review of patient status, including review of consultants reports, relevant laboratory and other test results, and collaboration with appropriate care team members and the patient's provider was performed as part of comprehensive patient evaluation and provision of care management services.    I received a return phone call from Ms. Kubota today for her initial CCM RNCM follow up.   Goals Addressed      Patient Stated   . "I need to see a cardiologist" (pt-stated)       Current Barriers:  . Financial constraints - the patient is out of work due to [place] [employer] . Lack of insurance  Clinical Social Work Clinical Goal(s):  [name] Kitchen Over the next 30 days, client will work with SW to address concerns related to financial resources  Interventions: . Patient interviewed and appropriate assessments performed . Discussed plans with patient for ongoing care management follow up and provided patient with direct contact information for care management team  . Assessed for patients eligibility to apply for Medicaid - the patient is not disabled and does not meet any qualifiers to be eligible for Medicaid coverage .  Discussed option to privately pay for insurance - determined that even when the patient is able to work she is unable to afford extra costs . Collaboration with [facility] via e-mail regarding patient eligibility  CCM RN CM Interventions: 10/09/18 completed call with patient  . Assessed for effective BP maintenance and ability to Self manage HTN . Reiterated the importance of patient taking her BP and cardiac meds exactly as prescribed w/o missed doses . Medication review completed with patient; discussed indication, dosage and frequency - pt verbalizes understanding - states she has all BP and heart meds on hand and is taking them as directed . Assessed for patients ability and comfort level to Self monitor her BP - pt has a cuff at home and checks her BP daily  . Provided verbal education related to target BP of 130/80 or below; discussed abnormally higher or lower BP's should be reported to the CCM team and or PCP promptly - pt verbalizes understanding  . Provided patient with the website address and phone number for [facility];  FitBoxer[employer]tn - contact # [phone]; patient instructed on completing the application for the orange card program - patient provided information about what services the program has to offer including having access to a group of doctors, pharmacies and other health care related agencies that offer services for low income, uninsured adults in [place] . Discussed patient will plan to go on line to complete the application asap . Discussed plans with patient for ongoing care  management follow up and provided patient with direct contact information for care management team  Patient Self Care Activities:  . Attends all scheduled provider appointments . Performs ADL's independently . Calls provider office for new concerns or questions  Please see past updates related to this goal by clicking on  the "Past Updates" button in the selected goal     . "I would like to learn more about my Diabetes" (pt-stated)       Current Barriers:  [name] Kitchen Knowledge Deficits related to Diabetes disease management and Self Health Care . Film/video editor.  . Difficulty obtaining medications  Nurse Case Manager Clinical Goal(s):  [name] Kitchen Over the next 30 days, patient will verbalize understanding of plan for ineffective health maintenance secondary to having unemployment and lack of medical insurance . Over the next 60 days, patient will verbalize basic understanding of diabetes disease process and self health management plan as evidenced by demonstrating improved Self health management for DM, including improved medication adherence, implementing exercise into her daily routine and following ADA diet as recommended  CCM RN CM Interventions:  10/09/18 completed call with patient   . Evaluation of current treatment plan related to diabetes and patient's adherence to plan as established by provider. . Provided education to patient re: current A1C of 8.0; discussed target A1C of <7.0 . Reviewed medications with patient and discussed application completion for assistance with cost of Ozempic; discussed the importance of taking her Diabetic medication as exactly as prescribed for best benefits  Collaborated with embedded PharmD [name] regarding resources for glucometer; discussed having patient consider buying a relion glucometer for $9 OTC at [facility], strips are also $9 for 50 ct- notified patient, provided additional resource if unable to afford; [employer] [facility] [phone] to request a free glucometer . Collaborated with ADA to request resources available to assist with providing uninsured patients blood glucose meters - resources obtained . Requested printed Diabetes patient education materials be mailed for review and discussion at next call; How to manage your diabetes; Meal Planning using the Plate  Method, Know Your A1C; signs/symptoms of Hypo/Hyperglycemia; A guide to living with Cholesterol and Diabetes . Sent message to provider [name], FNP requesting a printed letter stating patient's specified chronic health conditions to be prepared for patient to pick up from the office - patient states she needs the letter to help get financial assistance . Discussed plans with patient for ongoing care management follow up and provided patient with direct contact information for care management team . Provided patient and/or caregiver with name/cost information about relion glucose meter located at [facility]  Patient Self Care Activities:  . Self administers medications as prescribed . Attends all scheduled provider appointments . Calls pharmacy for medication refills . Performs ADL's independently . Performs IADL's independently . Calls provider office for new concerns or questions  Initial goal documentation        Follow up plan:  The care management team will reach out to the patient again over the next 2-3 weeks.   Barb Merino, RN, BSN, CCM Care Management Coordinator [place] [facility]  Direct Phone: [phone]

## 2018-10-09 NOTE — Patient Instructions (Signed)
Visit Information  Goals Addressed      Patient Stated   .       Current Barriers:  . Financial constraints - the patient is out of work due to [place] 19 . Lack of insurance  Clinical Social Work Clinical Goal(s):  [name] Kitchen Over the next 30 days, client will work with SW to address concerns related to financial resources  Interventions: . Patient interviewed and appropriate assessments performed . Discussed plans with patient for ongoing care management follow up and provided patient with direct contact information for care management team  . Assessed for patients eligibility to apply for Medicaid - the patient is not disabled and does not meet any qualifiers to be eligible for Medicaid coverage . Discussed option to privately pay for insurance - determined that even when the patient is able to work she is unable to afford extra costs . Collaboration with [place] via e-mail regarding patient eligibility  CCM RN CM Interventions: 10/09/18 completed call with patient  . Assessed for effective BP maintenance and ability to Self manage HTN . Reiterated the importance of patient taking her BP and cardiac meds exactly as prescribed w/o missed doses . Medication review completed with patient; discussed indication, dosage and frequency - pt verbalizes understanding - states she has all BP and heart meds on hand and is taking them as directed . Assessed for patients ability and comfort level to Self monitor her BP - pt has a cuff at home and checks her BP daily  . Provided verbal education related to target BP of 130/80 or below; discussed abnormally higher or lower BP's should be reported to the CCM team and or PCP promptly - pt verbalizes understanding  . Provided patient with the website address and phone number for [facility];  [employer]Boxer[employer]tn - contact # [phone]; patient instructed on completing the application for the orange  card program - patient provided information about what services the program has to offer including having access to a group of doctors, pharmacies and other health care related agencies that offer services for low income, uninsured adults in [place] . Discussed patient will plan to go on line to complete the application asap . Discussed plans with patient for ongoing care management follow up and provided patient with direct contact information for care management team  Patient Self Care Activities:  . Attends all scheduled provider appointments . Performs ADL's independently . Calls provider office for new concerns or questions  Please see past updates related to this goal by clicking on the "Past Updates" button in the selected goal     .       Current Barriers:  [name] Kitchen Knowledge Deficits related to Diabetes disease management and Self Health Care . Film/video editor.  . Difficulty obtaining medications  Nurse Case Manager Clinical Goal(s):  [name] Kitchen Over the next 30 days, patient will verbalize understanding of plan for ineffective health maintenance secondary to having unemployment and lack of medical insurance . Over the next 60 days, patient will verbalize basic understanding of diabetes disease process and self health management plan as evidenced by demonstrating improved Self health management for DM, including improved medication adherence, implementing exercise into her daily routine and following ADA diet as recommended  CCM RN CM Interventions:  10/09/18 completed call with patient   . Evaluation of current treatment plan related to diabetes and patient's adherence to plan as established by provider. . Provided education to patient re: current  A1C of 8.0; discussed target A1C of <7.0 . Reviewed medications with patient and discussed application completion for assistance with cost of Ozempic; discussed the importance of taking her Diabetic medication as exactly as prescribed for  best benefits  . Collaborated with embedded PharmD [name] regarding resources for glucometer; discussed having patient consider buying a relion glucometer for $9 OTC at [facility], strips are also $9 for 50 ct - notified patient, provided additional resource if unable to afford; [employer] [facility] [phone] to request a free glucometer . Collaborated with ADA to request resources available to assist with providing uninsured patients blood glucose meters - resources obtained . Requested printed Diabetes patient education materials be mailed for review and discussion at next call; How to manage your diabetes; Meal Planning using the Plate Method, Know Your A1C; signs/symptoms of Hypo/Hyperglycemia; A guide to living with Cholesterol and Diabetes . Sent message to provider [name], FNP requesting a printed letter stating patient's specified chronic health conditions to be prepared for patient to pick up from the office - patient states she needs the letter to help get financial assistance . Discussed plans with patient for ongoing care management follow up and provided patient with direct contact information for care management team . Provided patient and/or caregiver with name/cost information about relion glucose meter located at [facility]  Patient Self Care Activities:  . Self administers medications as prescribed . Attends all scheduled provider appointments . Calls pharmacy for medication refills . Performs ADL's independently . Performs IADL's independently . Calls provider office for new concerns or questions  Initial goal documentation        The patient verbalized understanding of instructions provided today and declined a print copy of patient instruction materials.   The care management team will reach out to the patient again over the next 2-3 weeks.   Barb Merino, RN, BSN, CCM Care Management Coordinator [place] [facility]   Direct Phone: [phone]

## 2018-10-11 ENCOUNTER — Telehealth: Payer: Self-pay

## 2018-10-11 ENCOUNTER — Ambulatory Visit: Payer: Self-pay

## 2018-10-11 NOTE — Chronic Care Management (AMB) (Signed)
  Care Management   Outreach Note  10/11/2018 Name: Jessica Cruz MRN: [id number] DOB: 1965-07-21  Referred by: [name], FNP Reason for referral : Care Coordination   Third unsuccessful telephone outreach was attempted today. The patient was referred to the case management team for assistance with chronic care management and care coordination. The patient's primary care provider has been notified of our unsuccessful attempts to make or maintain contact with the patient. The care management team is pleased to engage with this patient at any time in the future should he/she be interested in assistance from the care management team.     [name], BSW, CDP Social Worker, Certified Dementia Practitioner [place] [facility] [place] [facility] [phone]

## 2018-10-21 ENCOUNTER — Ambulatory Visit: Payer: Self-pay | Admitting: Pharmacist

## 2018-10-21 DIAGNOSIS — E119 Type 2 diabetes mellitus without complications: Secondary | ICD-10-CM

## 2018-10-21 DIAGNOSIS — I119 Hypertensive heart disease without heart failure: Secondary | ICD-10-CM

## 2018-10-21 DIAGNOSIS — G459 Transient cerebral ischemic attack, unspecified: Secondary | ICD-10-CM

## 2018-10-21 NOTE — Progress Notes (Signed)
  Chronic Care Management   Outreach Note  10/21/2018 Name: Jessica Cruz MRN: [id number] DOB: 01-18-1966  Referred by: [name], FNP Reason for referral : Chronic Care Management  An unsuccessful telephone outreach was attempted today. The patient was referred to the case management team by for assistance with chronic care management and care coordination.   Follow Up Plan: The care management team will reach out to the patient again over the next 7 business days.   Regina Eck, PharmD, BCPS Clinical Pharmacist, [facility] [place]: [phone]

## 2018-10-24 ENCOUNTER — Telehealth: Payer: Self-pay

## 2018-10-25 ENCOUNTER — Ambulatory Visit: Payer: Self-pay

## 2018-10-25 DIAGNOSIS — I119 Hypertensive heart disease without heart failure: Secondary | ICD-10-CM

## 2018-10-25 DIAGNOSIS — E119 Type 2 diabetes mellitus without complications: Secondary | ICD-10-CM

## 2018-10-25 NOTE — Chronic Care Management (AMB) (Signed)
  Care Management   Outreach Note  10/25/2018 Name: Jessica Cruz MRN: [id number] DOB: 08-Sep-1965  Referred by: [name] FNP Reason for referral : Care Coordination ([place] RNCM Telephone Follow up )   An unsuccessful telephone outreach was attempted today. The patient was referred to the case management team by [name] FNP for assistance with chronic care management and care coordination.   Follow Up Plan: The care management team will reach out to the patient again over the next 7-14 days.    Barb Merino, RN, BSN, CCM Care Management Coordinator [place] [facility]  Direct Phone: [phone]

## 2018-10-28 ENCOUNTER — Ambulatory Visit: Payer: Self-pay | Admitting: Pharmacist

## 2018-10-28 NOTE — Progress Notes (Signed)
  Chronic Care Management   Outreach Note  10/28/2018 Name: Jessica Cruz MRN: [id number] DOB: 27-Aug-1965  Referred by: Glendale Chard, MD Reason for referral : Chronic Care Management   A second unsuccessful telephone outreach was attempted today. The patient was referred to the case management team for assistance with chronic care management and care coordination.   Follow Up Plan: The care management team will reach out to the patient again over the next 3-5 business  days.    Regina Eck, PharmD, [place]  [phone]

## 2018-10-29 ENCOUNTER — Other Ambulatory Visit: Payer: Self-pay | Admitting: Nurse Practitioner

## 2018-10-29 DIAGNOSIS — M62838 Other muscle spasm: Secondary | ICD-10-CM

## 2018-10-29 NOTE — Telephone Encounter (Signed)
Flexeril: refill ° °

## 2018-11-04 ENCOUNTER — Ambulatory Visit: Payer: Self-pay | Admitting: Pharmacist

## 2018-11-04 NOTE — Progress Notes (Signed)
  Chronic Care Management   Outreach Note  11/04/2018 Name: Jessica Cruz MRN: [id number] DOB: 03/10/1966  Referred by: Glendale Chard, MD Reason for referral : Chronic Care Management   Third unsuccessful telephone outreach was attempted today. The patient was referred to the case management team for assistance with chronic care management and care coordination. The patient's primary care provider has been notified of our unsuccessful attempts to make or maintain contact with the patient. The care management team is pleased to engage with this patient at any time in the future should he/she be interested in assistance from the care management team.    Regina Eck, PharmD, BCPS Clinical Pharmacist, [facility] [place]: [phone]

## 2018-11-12 ENCOUNTER — Telehealth: Payer: Self-pay

## 2018-12-10 ENCOUNTER — Ambulatory Visit: Payer: Self-pay

## 2018-12-10 ENCOUNTER — Telehealth: Payer: Self-pay

## 2018-12-10 DIAGNOSIS — I119 Hypertensive heart disease without heart failure: Secondary | ICD-10-CM

## 2018-12-10 DIAGNOSIS — E119 Type 2 diabetes mellitus without complications: Secondary | ICD-10-CM

## 2018-12-10 NOTE — Chronic Care Management (AMB) (Signed)
  Chronic Care Management   Outreach Note  12/10/2018 Name: Jessica Cruz MRN: [id number] DOB: December 24, 1965  Referred by: Glendale Chard, MD Reason for referral : Care Coordination ([place] RNCM Telephone follow up )   A second unsuccessful telephone outreach was attempted today. The patient was referred to the case management team for assistance with chronic care management and care coordination.   Follow Up Plan: Telephone follow up appointment with care management team member scheduled for: 01/02/19  Barb Merino, RN, BSN, CCM Care Management Coordinator [place] [facility]  Direct Phone: [phone]

## 2018-12-16 ENCOUNTER — Ambulatory Visit: Payer: Self-pay | Admitting: Nurse Practitioner

## 2019-01-02 ENCOUNTER — Ambulatory Visit: Payer: Self-pay

## 2019-01-02 ENCOUNTER — Telehealth: Payer: Self-pay

## 2019-01-02 DIAGNOSIS — I119 Hypertensive heart disease without heart failure: Secondary | ICD-10-CM

## 2019-01-02 DIAGNOSIS — E119 Type 2 diabetes mellitus without complications: Secondary | ICD-10-CM

## 2019-01-02 NOTE — Chronic Care Management (AMB) (Signed)
  Chronic Care Management   Outreach Note  01/02/2019 Name: Jessica Cruz MRN: [id number] DOB: 16-Jul-1965  Referred by: Glendale Chard, MD Reason for referral : Care Coordination (#3 [place] )   Third unsuccessful telephone outreach was attempted today. The patient was referred to the case management team for assistance with chronic care management and care coordination. The patient's primary care provider has been notified of our unsuccessful attempts to make or maintain contact with the patient. The care management team is pleased to engage with this patient at any time in the future should he/she be interested in assistance from the care management team.   Follow Up Plan: A HIPPA compliant phone message was left for the patient providing contact information and requesting a return call. No further follow up required: The care management team is available to follow up with the patient after provider conversation with the patient regarding recommendation for care management engagement and subsequent re-referral to the care management team.    Barb Merino, RN, BSN, CCM Care Management Coordinator [facility]  Direct Phone: [phone]

## 2019-01-23 ENCOUNTER — Telehealth (INDEPENDENT_AMBULATORY_CARE_PROVIDER_SITE_OTHER): Payer: Self-pay | Admitting: Nurse Practitioner

## 2019-01-23 ENCOUNTER — Encounter: Payer: Self-pay | Admitting: Nurse Practitioner

## 2019-01-23 DIAGNOSIS — R350 Frequency of micturition: Secondary | ICD-10-CM

## 2019-01-23 DIAGNOSIS — Z9114 Patient's other noncompliance with medication regimen: Secondary | ICD-10-CM

## 2019-01-23 DIAGNOSIS — R634 Abnormal weight loss: Secondary | ICD-10-CM

## 2019-01-23 DIAGNOSIS — E1165 Type 2 diabetes mellitus with hyperglycemia: Secondary | ICD-10-CM

## 2019-01-23 DIAGNOSIS — R0602 Shortness of breath: Secondary | ICD-10-CM

## 2019-01-23 NOTE — Progress Notes (Signed)
Virtual Visit via Virtual   This visit type was conducted due to national recommendations for restrictions regarding the COVID-19 Pandemic (e.g. social distancing) in an effort to limit this patient's exposure and mitigate transmission in our community.  Due to her co-morbid illnesses, this patient is at least at moderate risk for complications without adequate follow up.  This format is felt to be most appropriate for this patient at this time.  All issues noted in this document were discussed and addressed.  A limited physical exam was performed with this format.    This visit type was conducted due to national recommendations for restrictions regarding the COVID-19 Pandemic (e.g. social distancing) in an effort to limit this patient's exposure and mitigate transmission in our community.  Patients identity confirmed using two different identifiers.  This format is felt to be most appropriate for this patient at this time.  All issues noted in this document were discussed and addressed.  No physical exam was performed (except for noted visual exam findings with Video Visits).    Date:  01/23/2019   ID:  NATHAN GOPALAKRISHNAN, DOB October 10, 1965, MRN [id number]  Patient Location:  Home - spoke with [name]  Provider location:   Office    Chief Complaint:  One month history of poor appetite  History of Present Illness:    SHATINA ROHLER is a 53 y.o. female who presents via video conferencing for a telehealth visit today.    The patient does have symptoms concerning for COVID-19 infection (fever, chills, cough, or new shortness of breath).   She took a test in July for coronavirus.  She has not had an appetite since August.  Increased thirst, no appetite, thickness in her mouth causing her to gag. She is drinking more water.  She is having weakness and having an increase in shortness of breath.  She has been out of work for 2 weeks due to not feeling well. Denies fever or diarrhea. She has lost 15 lbs  in the last couple weeks. She has not been taking the metformin.  She endorses shortness of breath even when sitting.      Past Medical History:  Diagnosis Date  . Anxiety   . Brain aneurysm    a. h/o intracranial aneurysm rupture (clipped [facility] 2007).  . Essential hypertension   . Fibroids   . Hyperlipidemia   . Hypertensive heart disease   . Liver nodule   . Menorrhagia   . Microcytic anemia   . Non-ST elevation myocardial infarction (NSTEMI), type 2    a. demand ischemia 01/2016 in setting of accelerated HTN, anemia - normal cors by cath.  . Obesity   . Pre-diabetes   . Stroke [facility])    Past Surgical History:  Procedure Laterality Date  . CARDIAC CATHETERIZATION N/A 02/16/2016   Procedure: Left Heart Cath and Coronary Angiography;  Surgeon: Wellington Hampshire, MD;  Location: [facility];  Service: Cardiovascular;  Laterality: N/A;  . CEREBRAL ANEURYSM REPAIR       Current Meds  Medication Sig  . acetaminophen (TYLENOL) 650 MG CR tablet Take 650 mg by mouth 2 (two) times daily as needed for pain.  [name] Kitchen aspirin 325 MG tablet Take 1 tablet (325 mg total) by mouth daily.  [name] Kitchen atorvastatin (LIPITOR) 40 MG tablet Take 1 tablet (40 mg total) by mouth daily.  . carvedilol (COREG) 12.5 MG tablet Take 1 tablet (12.5 mg total) by mouth 2 (two) times daily with a  meal.  . ferrous sulfate 325 (65 FE) MG tablet Take 1 tablet (325 mg total) by mouth 2 (two) times daily with a meal.  . gabapentin (NEURONTIN) 100 MG capsule Take 2 tablets at bedtime  . hydrALAZINE (APRESOLINE) 50 MG tablet Take 1 tablet (50 mg total) by mouth every 8 (eight) hours.  [name] Kitchen losartan (COZAAR) 50 MG tablet Take 1 tablet (50 mg total) by mouth daily.     Allergies:   Pineapple and Shellfish allergy   Social History   Tobacco Use  . Smoking status: Never Smoker  . Smokeless tobacco: Never Used  Substance Use Topics  . Alcohol use: Yes    Comment: rare  . Drug use: No     Family Hx: The patient's family  history includes Diabetes in her mother; Heart attack in her paternal grandfather and paternal grandmother; Heart attack (age of onset: 15) in her father; Heart disease in her maternal grandmother, paternal grandfather, and paternal grandmother; Heart failure in her paternal grandfather and paternal grandmother; Hypertension in her mother; Stroke in her father, maternal grandfather, and paternal grandfather.  ROS:   Please see the history of present illness.    Review of Systems  Constitutional: Positive for weight loss.  Respiratory: Positive for shortness of breath.   Cardiovascular: Negative.   Genitourinary: Positive for frequency. Negative for dysuria and flank pain.  Neurological: Negative.  Negative for dizziness and headaches.  Endo/Heme/Allergies: Positive for polydipsia.  Psychiatric/Behavioral: Negative.     All other systems reviewed and are negative.   Labs/Other Tests and Data Reviewed:    Recent Labs: 04/05/2018: Hemoglobin 10.0; Platelets 379 09/12/2018: ALT 24; BUN 12; Creatinine, Ser 0.81; Potassium 4.6; Sodium 138   Recent Lipid Panel Lab Results  Component Value Date/Time   CHOL 238 (H) 09/12/2018 12:02 PM   TRIG 311 (H) 09/12/2018 12:02 PM   HDL 41 09/12/2018 12:02 PM   CHOLHDL 5.8 (H) 09/12/2018 12:02 PM   CHOLHDL 7.1 10/08/2016 04:03 AM   LDLCALC 135 (H) 09/12/2018 12:02 PM    Wt Readings from Last 3 Encounters:  09/12/18 246 lb 6.4 oz (111.8 kg)  04/05/18 246 lb (111.6 kg)  12/06/17 255 lb (115.7 kg)     Exam:    Vital Signs:  LMP 10/30/2018 (Approximate)     Physical Exam  Constitutional: She is oriented to person, place, and time and well-developed, well-nourished, and in no distress.  Pulmonary/Chest: No respiratory distress.  No obvious shortness of breath.    Neurological: She is alert and oriented to person, place, and time.  Psychiatric: Mood, memory, affect and judgment normal.    ASSESSMENT & PLAN:    1. Abnormal weight loss  She  has had 15 lb weight loss in the last 2 weeks  I am concerned she may have elevated blood sugars due to not taking her medications  2. Increased frequency of urination  This is a virtual visit so unable to do a urine sample   I have advised her to go to the ER for further evaluation  3. Uncontrolled type 2 diabetes mellitus with hyperglycemia ([place])  Not taking medications regularly  I had referred her to CCM to assist with medications and resources however she did not follow through  I am concerned her she is in DKA so I have advised her to go to the ER for further evaluation  4. Shortness of breath  Due to her shortness of breath I advised her to go to the ER  for further evaluation    COVID-19 Education: The signs and symptoms of COVID-19 were discussed with the patient and how to seek care for testing (follow up with PCP or arrange E-visit).  The importance of social distancing was discussed today.  Patient Risk:   After full review of this patients clinical status, I feel that they are at least moderate risk at this time.  Time:   Today, I have spent 14 minutes/ seconds with the patient with telehealth technology discussing above diagnoses.     Medication Adjustments/Labs and Tests Ordered: Current medicines are reviewed at length with the patient today.  Concerns regarding medicines are outlined above.   Tests Ordered: No orders of the defined types were placed in this encounter.   Medication Changes: No orders of the defined types were placed in this encounter.   Disposition:  Follow up after ER visit  Signed, [name], FNP

## 2019-01-24 ENCOUNTER — Telehealth: Payer: Self-pay | Admitting: Nurse Practitioner

## 2019-01-24 NOTE — Telephone Encounter (Signed)
I called patient to inquire if she went to the ER, there is no notation in Epic at this time, no answer. I am unable to leave a message on her voicemail due to the mailbox being full.

## 2019-01-27 ENCOUNTER — Inpatient Hospital Stay (HOSPITAL_COMMUNITY)
Admission: EM | Admit: 2019-01-27 | Discharge: 2019-01-29 | DRG: 638 | Disposition: A | Discharge status: Home / Self Care | Payer: Self-pay | Attending: Internal Medicine | Admitting: Internal Medicine

## 2019-01-27 ENCOUNTER — Encounter (HOSPITAL_COMMUNITY): Payer: Self-pay | Admitting: *Deleted

## 2019-01-27 ENCOUNTER — Other Ambulatory Visit: Payer: Self-pay

## 2019-01-27 DIAGNOSIS — Z794 Long term (current) use of insulin: Secondary | ICD-10-CM

## 2019-01-27 DIAGNOSIS — E785 Hyperlipidemia, unspecified: Secondary | ICD-10-CM | POA: Diagnosis present

## 2019-01-27 DIAGNOSIS — I10 Essential (primary) hypertension: Secondary | ICD-10-CM

## 2019-01-27 DIAGNOSIS — Z823 Family history of stroke: Secondary | ICD-10-CM

## 2019-01-27 DIAGNOSIS — I119 Hypertensive heart disease without heart failure: Secondary | ICD-10-CM

## 2019-01-27 DIAGNOSIS — Z7982 Long term (current) use of aspirin: Secondary | ICD-10-CM

## 2019-01-27 DIAGNOSIS — E669 Obesity, unspecified: Secondary | ICD-10-CM | POA: Diagnosis present

## 2019-01-27 DIAGNOSIS — Z20828 Contact with and (suspected) exposure to other viral communicable diseases: Secondary | ICD-10-CM | POA: Diagnosis present

## 2019-01-27 DIAGNOSIS — I252 Old myocardial infarction: Secondary | ICD-10-CM

## 2019-01-27 DIAGNOSIS — D509 Iron deficiency anemia, unspecified: Secondary | ICD-10-CM | POA: Diagnosis present

## 2019-01-27 DIAGNOSIS — E1110 Type 2 diabetes mellitus with ketoacidosis without coma: Principal | ICD-10-CM | POA: Diagnosis present

## 2019-01-27 DIAGNOSIS — N179 Acute kidney failure, unspecified: Secondary | ICD-10-CM | POA: Diagnosis present

## 2019-01-27 DIAGNOSIS — E875 Hyperkalemia: Secondary | ICD-10-CM | POA: Diagnosis present

## 2019-01-27 DIAGNOSIS — R634 Abnormal weight loss: Secondary | ICD-10-CM

## 2019-01-27 DIAGNOSIS — Z833 Family history of diabetes mellitus: Secondary | ICD-10-CM

## 2019-01-27 DIAGNOSIS — Z8673 Personal history of transient ischemic attack (TIA), and cerebral infarction without residual deficits: Secondary | ICD-10-CM

## 2019-01-27 DIAGNOSIS — K5909 Other constipation: Secondary | ICD-10-CM | POA: Diagnosis present

## 2019-01-27 DIAGNOSIS — Z8249 Family history of ischemic heart disease and other diseases of the circulatory system: Secondary | ICD-10-CM

## 2019-01-27 DIAGNOSIS — I2510 Atherosclerotic heart disease of native coronary artery without angina pectoris: Secondary | ICD-10-CM | POA: Diagnosis present

## 2019-01-27 DIAGNOSIS — G459 Transient cerebral ischemic attack, unspecified: Secondary | ICD-10-CM | POA: Diagnosis present

## 2019-01-27 DIAGNOSIS — Z91013 Allergy to seafood: Secondary | ICD-10-CM

## 2019-01-27 DIAGNOSIS — Z6834 Body mass index (BMI) 34.0-34.9, adult: Secondary | ICD-10-CM

## 2019-01-27 LAB — COMPREHENSIVE METABOLIC PANEL
ALT: 24 U/L (ref 0–44)
AST: 47 U/L — ABNORMAL HIGH (ref 15–41)
Albumin: 3.2 g/dL — ABNORMAL LOW (ref 3.5–5.0)
Alkaline Phosphatase: 84 U/L (ref 38–126)
Anion gap: 17 — ABNORMAL HIGH (ref 5–15)
BUN: 7 mg/dL (ref 6–20)
CO2: 18 mmol/L — ABNORMAL LOW (ref 22–32)
Calcium: 9.2 mg/dL (ref 8.9–10.3)
Chloride: 94 mmol/L — ABNORMAL LOW (ref 98–111)
Creatinine, Ser: 1.14 mg/dL — ABNORMAL HIGH (ref 0.44–1.00)
GFR calc Af Amer: >60 mL/min (ref >60)
GFR calc non Af Amer: 56 mL/min — ABNORMAL LOW (ref >60)
Glucose, Bld: 666 mg/dL (ref 70–99)
Potassium: 5.6 mmol/L — ABNORMAL HIGH (ref 3.5–5.1)
Sodium: 129 mmol/L — ABNORMAL LOW (ref 135–145)
Total Bilirubin: 1.2 mg/dL (ref 0.3–1.2)
Total Protein: 6.6 g/dL (ref 6.5–8.1)

## 2019-01-27 LAB — BASIC METABOLIC PANEL
Anion gap: 10 (ref 5–15)
Anion gap: 11 (ref 5–15)
BUN: 6 mg/dL (ref 6–20)
BUN: 9 mg/dL (ref 6–20)
CO2: 21 mmol/L — ABNORMAL LOW (ref 22–32)
CO2: 22 mmol/L (ref 22–32)
Calcium: 8.5 mg/dL — ABNORMAL LOW (ref 8.9–10.3)
Calcium: 8.4 mg/dL — ABNORMAL LOW (ref 8.9–10.3)
Chloride: 101 mmol/L (ref 98–111)
Chloride: 102 mmol/L (ref 98–111)
Creatinine, Ser: 0.78 mg/dL (ref 0.44–1.00)
Creatinine, Ser: 0.74 mg/dL (ref 0.44–1.00)
GFR calc Af Amer: >60 mL/min (ref >60)
GFR calc Af Amer: >60 mL/min (ref >60)
GFR calc non Af Amer: >60 mL/min (ref >60)
GFR calc non Af Amer: >60 mL/min (ref >60)
Glucose, Bld: 344 mg/dL — ABNORMAL HIGH (ref 70–99)
Glucose, Bld: 195 mg/dL — ABNORMAL HIGH (ref 70–99)
Potassium: 3.7 mmol/L (ref 3.5–5.1)
Potassium: 3.8 mmol/L (ref 3.5–5.1)
Sodium: 132 mmol/L — ABNORMAL LOW (ref 135–145)
Sodium: 135 mmol/L (ref 135–145)

## 2019-01-27 LAB — GLUCOSE, CAPILLARY
Glucose-Capillary: 179 mg/dL — ABNORMAL HIGH (ref 70–99)
Glucose-Capillary: 201 mg/dL — ABNORMAL HIGH (ref 70–99)
Glucose-Capillary: 369 mg/dL — ABNORMAL HIGH (ref 70–99)

## 2019-01-27 LAB — HIV ANTIBODY (ROUTINE TESTING W REFLEX): HIV Screen 4th Generation wRfx: NONREACTIVE

## 2019-01-27 LAB — URINALYSIS, ROUTINE W REFLEX MICROSCOPIC
Bilirubin Urine: NEGATIVE
Glucose, UA: >=500 mg/dL — AB
Ketones, ur: NEGATIVE mg/dL
Leukocytes,Ua: NEGATIVE
Nitrite: NEGATIVE
Protein, ur: NEGATIVE mg/dL
Specific Gravity, Urine: 1.036 — ABNORMAL HIGH (ref 1.005–1.030)
pH: 5 (ref 5.0–8.0)

## 2019-01-27 LAB — CBC WITH DIFFERENTIAL/PLATELET
Abs Immature Granulocytes: 0.01 10*3/uL (ref 0.00–0.07)
Basophils Absolute: 0 10*3/uL (ref 0.0–0.1)
Basophils Relative: 1 %
Eosinophils Absolute: 0.1 10*3/uL (ref 0.0–0.5)
Eosinophils Relative: 2 %
HCT: 44.4 % (ref 36.0–46.0)
Hemoglobin: 14.1 g/dL (ref 12.0–15.0)
Immature Granulocytes: 0 %
Lymphocytes Relative: 33 %
Lymphs Abs: 1.9 10*3/uL (ref 0.7–4.0)
MCH: 26 pg (ref 26.0–34.0)
MCHC: 31.8 g/dL (ref 30.0–36.0)
MCV: 81.8 fL (ref 80.0–100.0)
Monocytes Absolute: 0.4 10*3/uL (ref 0.1–1.0)
Monocytes Relative: 7 %
Neutro Abs: 3.4 10*3/uL (ref 1.7–7.7)
Neutrophils Relative %: 57 %
Platelets: 349 10*3/uL (ref 150–400)
RBC: 5.43 MIL/uL — ABNORMAL HIGH (ref 3.87–5.11)
RDW: 16.1 % — ABNORMAL HIGH (ref 11.5–15.5)
WBC: 5.8 10*3/uL (ref 4.0–10.5)
nRBC: 0 % (ref 0.0–0.2)

## 2019-01-27 LAB — POCT I-STAT EG7
Acid-base deficit: 1 mmol/L (ref 0.0–2.0)
Bicarbonate: 26.6 mmol/L (ref 20.0–28.0)
Calcium, Ion: 1.2 mmol/L (ref 1.15–1.40)
HCT: 46 % (ref 36.0–46.0)
Hemoglobin: 15.6 g/dL — ABNORMAL HIGH (ref 12.0–15.0)
O2 Saturation: 96 %
Potassium: 4.4 mmol/L (ref 3.5–5.1)
Sodium: 130 mmol/L — ABNORMAL LOW (ref 135–145)
TCO2: 28 mmol/L (ref 22–32)
pCO2, Ven: 52.8 mmHg (ref 44.0–60.0)
pH, Ven: 7.311 (ref 7.250–7.430)
pO2, Ven: 89 mmHg — ABNORMAL HIGH (ref 32.0–45.0)

## 2019-01-27 LAB — CBG MONITORING, ED
Glucose-Capillary: >600 mg/dL (ref 70–99)
Glucose-Capillary: 389 mg/dL — ABNORMAL HIGH (ref 70–99)
Glucose-Capillary: 356 mg/dL — ABNORMAL HIGH (ref 70–99)
Glucose-Capillary: 357 mg/dL — ABNORMAL HIGH (ref 70–99)
Glucose-Capillary: 340 mg/dL — ABNORMAL HIGH (ref 70–99)
Glucose-Capillary: 229 mg/dL — ABNORMAL HIGH (ref 70–99)
Glucose-Capillary: 153 mg/dL — ABNORMAL HIGH (ref 70–99)

## 2019-01-27 LAB — LIPASE, BLOOD: Lipase: 26 U/L (ref 11–51)

## 2019-01-27 LAB — MAGNESIUM: Magnesium: 1.9 mg/dL (ref 1.7–2.4)

## 2019-01-27 LAB — TSH: TSH: 3.407 u[IU]/mL (ref 0.350–4.500)

## 2019-01-27 LAB — PHOSPHORUS: Phosphorus: 3 mg/dL (ref 2.5–4.6)

## 2019-01-27 LAB — SARS CORONAVIRUS 2 (TAT 6-24 HRS): SARS Coronavirus 2: NEGATIVE

## 2019-01-27 MED ORDER — GABAPENTIN 100 MG PO CAPS
200.0000 mg | ORAL_CAPSULE | Freq: Every day | ORAL | Status: DC
  Administered 2019-01-28 (×2): 200 mg via ORAL
  Filled 2019-01-27 (×2): qty 2

## 2019-01-27 MED ORDER — HYDRALAZINE HCL 50 MG PO TABS
50.0000 mg | ORAL_TABLET | Freq: Three times a day (TID) | ORAL | Status: DC
  Administered 2019-01-27 – 2019-01-29 (×7): 50 mg via ORAL
  Filled 2019-01-27 (×7): qty 1

## 2019-01-27 MED ORDER — LOSARTAN POTASSIUM 50 MG PO TABS
50.0000 mg | ORAL_TABLET | Freq: Every day | ORAL | Status: DC
  Administered 2019-01-27 – 2019-01-29 (×3): 50 mg via ORAL
  Filled 2019-01-27 (×3): qty 1

## 2019-01-27 MED ORDER — INSULIN REGULAR(HUMAN) IN NACL 100-0.9 UT/100ML-% IV SOLN
INTRAVENOUS | Status: DC
  Administered 2019-01-27: 3.3 [IU]/h via INTRAVENOUS
  Filled 2019-01-27 (×2): qty 100

## 2019-01-27 MED ORDER — ENOXAPARIN SODIUM 40 MG/0.4ML ~~LOC~~ SOLN
40.0000 mg | Freq: Every day | SUBCUTANEOUS | Status: DC
  Administered 2019-01-29: 40 mg via SUBCUTANEOUS
  Filled 2019-01-27 (×4): qty 0.4

## 2019-01-27 MED ORDER — INSULIN REGULAR BOLUS VIA INFUSION
0.0000 [IU] | Freq: Three times a day (TID) | INTRAVENOUS | Status: DC
  Filled 2019-01-27: qty 10

## 2019-01-27 MED ORDER — PANTOPRAZOLE SODIUM 40 MG IV SOLR
40.0000 mg | INTRAVENOUS | Status: DC
  Administered 2019-01-27: 40 mg via INTRAVENOUS
  Filled 2019-01-27: qty 40

## 2019-01-27 MED ORDER — SODIUM CHLORIDE 0.9 % IV BOLUS
1000.0000 mL | Freq: Once | INTRAVENOUS | Status: AC
  Administered 2019-01-27: 1000 mL via INTRAVENOUS

## 2019-01-27 MED ORDER — SODIUM CHLORIDE 0.9 % IV SOLN
INTRAVENOUS | Status: DC
  Administered 2019-01-27: 08:00:00 via INTRAVENOUS

## 2019-01-27 MED ORDER — INSULIN ASPART 100 UNIT/ML ~~LOC~~ SOLN
0.0000 [IU] | Freq: Three times a day (TID) | SUBCUTANEOUS | Status: DC
  Administered 2019-01-27: 3 [IU] via SUBCUTANEOUS
  Administered 2019-01-28 (×2): 7 [IU] via SUBCUTANEOUS
  Administered 2019-01-28: 9 [IU] via SUBCUTANEOUS
  Administered 2019-01-29 (×2): 5 [IU] via SUBCUTANEOUS

## 2019-01-27 MED ORDER — CALCIUM GLUCONATE-NACL 1-0.675 GM/50ML-% IV SOLN
1.0000 g | Freq: Once | INTRAVENOUS | Status: AC
  Administered 2019-01-27: 18:00:00 1000 mg via INTRAVENOUS
  Filled 2019-01-27: qty 50

## 2019-01-27 MED ORDER — ACETAMINOPHEN 325 MG PO TABS: 650.0000 mg | ORAL_TABLET | Freq: Four times a day (QID) | ORAL | Status: DC | PRN

## 2019-01-27 MED ORDER — PANTOPRAZOLE SODIUM 40 MG PO TBEC
40.0000 mg | DELAYED_RELEASE_TABLET | Freq: Every day | ORAL | Status: DC
  Administered 2019-01-28 – 2019-01-29 (×2): 40 mg via ORAL
  Filled 2019-01-27 (×2): qty 1

## 2019-01-27 MED ORDER — ASPIRIN 325 MG PO TABS
325.0000 mg | ORAL_TABLET | Freq: Every day | ORAL | Status: DC
  Administered 2019-01-27 – 2019-01-29 (×3): 325 mg via ORAL
  Filled 2019-01-27 (×3): qty 1

## 2019-01-27 MED ORDER — INSULIN REGULAR(HUMAN) IN NACL 100-0.9 UT/100ML-% IV SOLN
INTRAVENOUS | Status: DC
  Administered 2019-01-27: 3.3 [IU]/h via INTRAVENOUS

## 2019-01-27 MED ORDER — DEXTROSE 50 % IV SOLN: 25.0000 mL | INTRAVENOUS | Status: DC | PRN

## 2019-01-27 MED ORDER — DEXTROSE-NACL 5-0.45 % IV SOLN
INTRAVENOUS | Status: DC
  Administered 2019-01-27: 14:00:00 via INTRAVENOUS

## 2019-01-27 MED ORDER — CARVEDILOL 12.5 MG PO TABS
12.5000 mg | ORAL_TABLET | Freq: Two times a day (BID) | ORAL | Status: DC
  Administered 2019-01-27 – 2019-01-29 (×4): 12.5 mg via ORAL
  Filled 2019-01-27 (×4): qty 1

## 2019-01-27 MED ORDER — INSULIN REGULAR BOLUS VIA INFUSION
0.0000 [IU] | Freq: Three times a day (TID) | INTRAVENOUS | Status: DC
  Administered 2019-01-27: 6.8 [IU] via INTRAVENOUS
  Filled 2019-01-27: qty 10

## 2019-01-27 MED ORDER — INSULIN DETEMIR 100 UNIT/ML ~~LOC~~ SOLN
10.0000 [IU] | Freq: Every day | SUBCUTANEOUS | Status: DC
  Administered 2019-01-27: 15:00:00 10 [IU] via SUBCUTANEOUS
  Filled 2019-01-27 (×2): qty 0.1

## 2019-01-27 MED ORDER — SODIUM CHLORIDE 0.9% FLUSH
3.0000 mL | Freq: Two times a day (BID) | INTRAVENOUS | Status: DC
  Administered 2019-01-28 – 2019-01-29 (×3): 3 mL via INTRAVENOUS

## 2019-01-27 MED ORDER — ACETAMINOPHEN 650 MG RE SUPP: 650.0000 mg | Freq: Four times a day (QID) | RECTAL | Status: DC | PRN

## 2019-01-27 MED ORDER — DEXTROSE-NACL 5-0.45 % IV SOLN: INTRAVENOUS | Status: DC

## 2019-01-27 MED ORDER — ATORVASTATIN CALCIUM 40 MG PO TABS
40.0000 mg | ORAL_TABLET | Freq: Every day | ORAL | Status: DC
  Administered 2019-01-27 – 2019-01-29 (×3): 40 mg via ORAL
  Filled 2019-01-27 (×3): qty 1

## 2019-01-27 NOTE — ED Notes (Signed)
Family at bedside. 

## 2019-01-27 NOTE — ED Provider Notes (Signed)
[place] EMERGENCY DEPARTMENT Provider Note   CSN: [id number] Arrival date & time: 01/27/19  [id number]    History   Chief Complaint Chief Complaint  Patient presents with  . Abdominal Pain    HPI Jessica Cruz is a 53 y.o. female.   The history is provided by the patient.  She has history of hypertension, hyperlipidemia, prediabetes, coronary artery disease, transient ischemic attack, cerebral aneurysm and comes in complaining of not feeling well for about the last 2 months.  She has had decreased appetite along with polyuria and polydipsia.  Her mouth has felt thick and occasionally has a metallic taste in it.  There has been some intermittent epigastric pain and nausea.  She has chronic constipation which has not changed.  She denies fever, chills, sweats.  She denies vomiting.  She denies chest pain, heaviness, tightness, pressure.  She denies cough.  Her primary care provider had prescribed metformin, but she never got the prescription filled because family members have had GI upset from it.  She does not monitor her blood sugars at home.  She denies any vision change.  Past Medical History:  Diagnosis Date  . Anxiety   . Brain aneurysm    a. h/o intracranial aneurysm rupture (clipped [facility] 2007).  . Essential hypertension   . Fibroids   . Hyperlipidemia   . Hypertensive heart disease   . Liver nodule   . Menorrhagia   . Microcytic anemia   . Non-ST elevation myocardial infarction (NSTEMI), type 2    a. demand ischemia 01/2016 in setting of accelerated HTN, anemia - normal cors by cath.  . Obesity   . Pre-diabetes   . Stroke [facility])     Patient Active Problem List   Diagnosis Date Noted  . Vaginal discharge 04/05/2018  . Abnormal glucose 04/05/2018  . Controlled type 2 diabetes mellitus with hyperglycemia ([place]) 01/02/2018  . Anxiety 01/02/2018  . TIA (transient ischemic attack) 10/08/2016  . Expressive aphasia 10/08/2016  . Right hemiparesis ([place])  10/08/2016  . Chest discomfort 10/08/2016  . Hypertensive urgency 10/08/2016  . History of cerebral aneurysm repair 10/08/2016  . Pre-diabetes   . Obesity   . Hypertensive heart disease   . Microcytic anemia   . Liver nodule   . Essential hypertension   . Hyperlipidemia   . Iron deficiency anemia due to chronic blood loss   . ACS (acute coronary syndrome) ([place]) 02/16/2016  . Hypertension, uncontrolled 02/16/2016  . Hypertensive emergency   . Hypoxemia   . Chest pain   . NSTEMI (non-ST elevated myocardial infarction) [facility])     Past Surgical History:  Procedure Laterality Date  . CARDIAC CATHETERIZATION N/A 02/16/2016   Procedure: Left Heart Cath and Coronary Angiography;  Surgeon: Wellington Hampshire, MD;  Location: [place] [facility];  Service: Cardiovascular;  Laterality: N/A;  . CEREBRAL ANEURYSM REPAIR       OB History   No obstetric history on file.      Home Medications    Prior to Admission medications   Medication Sig Start Date End Date Taking? Authorizing Provider  acetaminophen (TYLENOL) 650 MG CR tablet Take 650 mg by mouth 2 (two) times daily as needed for pain.    [provider]  aspirin 325 MG tablet Take 1 tablet (325 mg total) by mouth daily. 10/10/16   Thurnell Lose, MD  atorvastatin (LIPITOR) 40 MG tablet Take 1 tablet (40 mg total) by mouth daily. 09/18/18  [name], FNP  carvedilol (COREG) 12.5 MG tablet Take 1 tablet (12.5 mg total) by mouth 2 (two) times daily with a meal. 09/18/18   [name], FNP  ferrous sulfate 325 (65 FE) MG tablet Take 1 tablet (325 mg total) by mouth 2 (two) times daily with a meal. 02/18/16   Ghimire, Henreitta Leber, MD  gabapentin (NEURONTIN) 100 MG capsule Take 2 tablets at bedtime 09/18/18   [name], FNP  hydrALAZINE (APRESOLINE) 50 MG tablet Take 1 tablet (50 mg total) by mouth every 8 (eight) hours. 09/18/18   [name], FNP  losartan (COZAAR) 50 MG tablet Take 1 tablet (50 mg total) by mouth daily. 09/18/18    [name], FNP    Family History Family History  Problem Relation Age of Onset  . Heart attack Father 77  . Stroke Father   . Diabetes Mother   . Hypertension Mother   . Heart disease Maternal Grandmother   . Stroke Maternal Grandfather   . Heart attack Paternal Grandmother   . Heart disease Paternal Grandmother   . Heart failure Paternal Grandmother   . Heart failure Paternal Grandfather   . Heart disease Paternal Grandfather   . Heart attack Paternal Grandfather   . Stroke Paternal Grandfather     Social History Social History   Tobacco Use  . Smoking status: Never Smoker  . Smokeless tobacco: Never Used  Substance Use Topics  . Alcohol use: Yes    Comment: rare  . Drug use: No     Allergies   Pineapple and Shellfish allergy   Review of Systems Review of Systems  All other systems reviewed and are negative.    Physical Exam Updated Vital Signs BP (!) 133/91   Pulse 69   Temp 98.4 F (36.9 C) (Oral)   Resp 16   Ht 5\' 6"  (1.676 m)   Wt 96.6 kg   SpO2 97%   BMI 34.38 kg/m   Physical Exam Vitals signs and nursing note reviewed.    53 year old female, resting comfortably and in no acute distress. Vital signs are normal. Oxygen saturation is 97%, which is normal. Head is normocephalic and atraumatic. PERRLA, EOMI. Oropharynx is clear. Neck is nontender and supple without adenopathy or JVD. Back is nontender and there is no CVA tenderness. Lungs are clear without rales, wheezes, or rhonchi. Chest is nontender. Heart has regular rate and rhythm without murmur. Abdomen is soft, flat, nontender without masses or hepatosplenomegaly and peristalsis is hypoactive. Extremities have no cyanosis or edema, full range of motion is present. Skin is warm and dry without rash. Neurologic: Mental status is normal, cranial nerves are intact, there are no motor or sensory deficits.  ED Treatments / Results  Labs (all labs ordered are listed, but only abnormal  results are displayed) Labs Reviewed  CBC WITH DIFFERENTIAL/PLATELET - Abnormal; Notable for the following components:      Result Value   RBC 5.43 (*)    RDW 16.1 (*)    All other components within normal limits  COMPREHENSIVE METABOLIC PANEL - Abnormal; Notable for the following components:   Sodium 129 (*)    Potassium 5.6 (*)    Chloride 94 (*)    CO2 18 (*)    Glucose, Bld 666 (*)    Creatinine, Ser 1.14 (*)    Albumin 3.2 (*)    AST 47 (*)    GFR calc non Af Amer 56 (*)    Anion gap 17 (*)  All other components within normal limits  CBG MONITORING, ED - Abnormal; Notable for the following components:   Glucose-Capillary >600 (*)    All other components within normal limits  POCT I-STAT EG7 - Abnormal; Notable for the following components:   pO2, Ven 89.0 (*)    Sodium 130 (*)    Hemoglobin 15.6 (*)    All other components within normal limits  CBG MONITORING, ED - Abnormal; Notable for the following components:   Glucose-Capillary 389 (*)    All other components within normal limits  SARS CORONAVIRUS 2 (TAT 6-24 HRS)  MAGNESIUM  PHOSPHORUS  LIPASE, BLOOD  URINALYSIS, ROUTINE W REFLEX MICROSCOPIC  HIV ANTIBODY (ROUTINE TESTING W REFLEX)  HIV4GL SAVE TUBE  BASIC METABOLIC PANEL  BASIC METABOLIC PANEL  I-STAT VENOUS BLOOD GAS, ED    Procedures Procedures  CRITICAL CARE Performed by: Delora Fuel Total critical care time: 50 minutes Critical care time was exclusive of separately billable procedures and treating other patients. Critical care was necessary to treat or prevent imminent or life-threatening deterioration. Critical care was time spent personally by me on the following activities: development of treatment plan with patient and/or surrogate as well as nursing, discussions with consultants, evaluation of patient's response to treatment, examination of patient, obtaining history from patient or surrogate, ordering and performing treatments and interventions,  ordering and review of laboratory studies, ordering and review of radiographic studies, pulse oximetry and re-evaluation of patient's condition.  Medications Ordered in ED Medications  dextrose 5 %-0.45 % sodium chloride infusion (has no administration in time range)  insulin regular bolus via infusion 0-10 Units (has no administration in time range)  insulin regular, human (MYXREDLIN) 100 units/ 100 mL infusion (has no administration in time range)  dextrose 50 % solution 25 mL (has no administration in time range)  enoxaparin (LOVENOX) injection 40 mg (has no administration in time range)  sodium chloride flush (NS) 0.9 % injection 3 mL (has no administration in time range)  acetaminophen (TYLENOL) tablet 650 mg (has no administration in time range)    Or  acetaminophen (TYLENOL) suppository 650 mg (has no administration in time range)  0.9 %  sodium chloride infusion (has no administration in time range)  sodium chloride 0.9 % bolus 1,000 mL (0 mLs Intravenous Stopped 01/27/19 0718)     Initial Impression / Assessment and Plan / ED Course  I have reviewed the triage vital signs and the nursing notes.  Pertinent lab results that were available during my care of the patient were reviewed by me and considered in my medical decision making (see chart for details).  Polyuria, polydipsia is suggestive of hyperglycemia with diabetes.  Capillary blood glucose at triage was greater than 600.  Will check metabolic panel as well as urinalysis.  Will need to check venous blood gas to make sure she is not in early ketoacidosis.  She will be given IV fluids.  Old records are reviewed confirming known diagnosis of prediabetes versus early type 2 diabetes.  Last blood glucose on record was 220 on Sep 12, 2018.  Labs are consistent with mild DKA.  CO2 is 18 with anion gap of 17.  Glucose is 666.  Venous pH is 7.31.  She is started on an insulin infusion.  Case was discussed with Dr. Tamala Julian of [facility], who agrees to admit the patient.  Final Clinical Impressions(s) / ED Diagnoses   Final diagnoses:  Diabetic ketoacidosis without coma associated with type 2 diabetes mellitus (  [facility])    ED Discharge Orders    None       Delora Fuel, MD [id number] [phone]

## 2019-01-27 NOTE — Discharge Instructions (Signed)
Glucose Products:  ReliOn™ glucose products raise low blood sugar fast. Tablets are free of fat, caffeine, sodium and gluten. They are portable and easy to carry, making it easier for people with diabetes to BE PREPARED for lows. ° °Glucose Tablets °Available in 6 flavors °• 10 ct...................................... $1.00 °• 50 ct...................................... $3.98 °Glucose Shot..................................$1.48 °Glucose Gel....................................$3.44 ° °Alcohol Swabs °Alcohol swabs are used to sterilize your injection site. All of our swabs are individually wrapped for maximum safety, convenience and moisture retention. °ReliOn™ Alcohol Swabs °• 100 ct Swabs..............................$1.00 °• 400 ct Swabs..............................$3.74 ° °Lancets °ReliOn™ offers three lancet options conveniently designed to work with almost every lancing device. Each features a protective disk, which guarantees sterility before testing. °ReliOn™ Lancets °• 100 ct Lancets $1.56 °• 200 ct Lancets $2.64 °Available in Ultra-Thin, Thin & Micro-Thin °ReliOn™ 2-IN-1 Lancing Device °• 50 ct Lancets..................................... $3.44 °Available in 30 gauge and 25 gauge °ReliOn™ Lancing Device....................$5.84 ° °Blood Glucose Monitors °ReliOn™ offers a full range of blood glucose testing options to provide an accurate, affordable °system that meets each person's unique needs and preferences. °Prime Meter....................................... $9.00 °Prime Test Strips °• 25 test strips.................................... $5.00 °• 50 test strips.................................... $9.00 °• 100 test strips.................................$17.88 °Premier BLU Meter  ………………………………  $18.98 °Premier Voice Meter  ……………………………….  $14.98 °Premier Test Strips °• 50 test strips.................................... $9.00 °• 100 test strips.................................$17.88 °Premier Compact Meter Kit   ………………………………  $19.44 °Kit includes: °• 50 test strips °• 10 lancets °• Lancing device °• Carry case ° °Ketone Test Strips °• 50 test strips  ……………………………………..  $6.64 ° °Human Insulin  °Novolin®/ReliOn™ (recombinant DNA origin) is manufactured for [employer] by Novo Nordisk °Novolin®/ReliOn™ Insulin* with Vial..........$24.88 °Available in N, R, 70/30 °Novolin®/ReliOn™ Insulin Pens*  ……………  $42.88 °Available in N, R, 70/30 ° °Insulin Delivery °ReliOn™ syringes and pen needles provide precision technology, comfort and accuracy in °insulin delivery at affordable prices. °ReliOn™ Pen Needles* °• 50 ct....................................................$9.00 °Available in 4mm, 6mm, 8mm & 12mm °ReliOn™ Insulin Syringes*  °• 100 ct ……………………………. $12.58 °Available in 29G, 30G & 31G (3/10cc, 1/2cc & 1cc units) °

## 2019-01-27 NOTE — Progress Notes (Signed)
Inpatient Diabetes Program Recommendations  AACE/ADA: New Consensus Statement on Inpatient Glycemic Control (2015)  Target Ranges:  Prepandial:   less than 140 mg/dL      Peak postprandial:   less than 180 mg/dL (1-2 hours)      Critically ill patients:  140 - 180 mg/dL   Lab Results  Component Value Date   GLUCAP 356 (H) 01/27/2019   HGBA1C 8.0 (H) 09/12/2018    Review of Glycemic Control  Diabetes history: DM 2 Outpatient Diabetes medications: prescribed metformin (pt not taking) Current orders for Inpatient glycemic control: IV insulin  Inpatient Diabetes Program Recommendations:    Pt not taking prescribed metformin outpatient.  Pt PCP referred pt to CCM for medication assistance and resources. Pt never followed up. Pt does not have insurance.  A1c 8% on 09/12/2018  Anticipate repeat A1c to be elevated enough to require insulin. May need [employer] insulin since she already has a PCP.  Will see pt this afternoon regarding glucose control and resources.  1424 pm:  Spoke with patient at bedside regarding A1c and glucose control at home. Pt reports seeing her doctor not too long ago. Pt was working with the office CCM in getting a GLP 1 injectable once a week for free but was not able to complete the paper work. Pt did not want to take the metformin due to GI side effects. Pts PCP did not prescribe anything else in its place.  Spoke with patient with my concern of her possibly needing insulin anticipating her A1c level to be too high for oral therapy only.   Discussed glucose and A1c goals. Discussed how to check glucose. Discussed how to draw up and administer insulin vial vial ans syringe. Patient hesitant with this method. Patient prefers insulin pen. Showed patient how to operate insulin pen.  Discussed DM supplies at [facility]. [place] list attached to d/c paperwork.  Patient prefers to follow up at one of our clinics in order to get her insulin outpatient.  Patient will need  a glucose meter + insulin pen + insulin pen needles at time of d/c.  Awaiting A1c level.  Thanks,  Tama Headings RN, MSN, BC-ADM Inpatient Diabetes Coordinator Team Pager [phone] (8a-5p)

## 2019-01-27 NOTE — ED Notes (Signed)
ED TO INPATIENT HANDOFF REPORT  ED Nurse Name and Phone [phone]  S Name/Age/Gender Jessica Cruz 53 y.o. female Room/Bed: 012C/012C  Code Status   Code Status: Full Code  Home/SNF/Other Home Patient oriented to: self, place, time and situation Is this baseline? Yes   Triage Complete: Triage complete  Chief Complaint weakness; weight loss   Triage Note Pt says that since about the middle of August, she has had intermittent stomach pain with nausea. She has not been eating much, has a metal taste in her mouth. Feels weak because she is not eating. More thirsty than normal, urinating more often. Lost 35lbs since August without trying. CBG HI reading in triage.    Allergies Allergies  Allergen Reactions  . Pineapple Itching and Swelling    TONGUE AND THROAT AFFECTED  . Shellfish Allergy Itching and Swelling    THROAT AND TONGUE ARE AFFECTED    Level of Care/Admitting Diagnosis ED Disposition    ED Disposition Condition Comment   Admit  Hospital Area: [place] [[id number]]  Level of Care: Progressive [102]  I expect the patient will be discharged within 24 hours: No (not a candidate for 5C-Observation unit)  Covid Evaluation: Asymptomatic Screening Protocol (No Symptoms)  Diagnosis: DKA, type 2 [facility]) [id number]  Admitting Physician: Norval Morton [id number]  Attending Physician: Norval Morton [id number]  PT Class (Do Not Modify): Observation [104]  PT Acc Code (Do Not Modify): Observation [10022]       B Medical/Surgery History Past Medical History:  Diagnosis Date  . Anxiety   . Brain aneurysm    a. h/o intracranial aneurysm rupture (clipped [facility] 2007).  . Essential hypertension   . Fibroids   . Hyperlipidemia   . Hypertensive heart disease   . Liver nodule   . Menorrhagia   . Microcytic anemia   . Non-ST elevation myocardial infarction (NSTEMI), type 2    a. demand ischemia 01/2016 in setting of accelerated HTN, anemia - normal cors by  cath.  . Obesity   . Pre-diabetes   . Stroke [facility])    Past Surgical History:  Procedure Laterality Date  . CARDIAC CATHETERIZATION N/A 02/16/2016   Procedure: Left Heart Cath and Coronary Angiography;  Surgeon: Wellington Hampshire, MD;  Location: [place] [facility];  Service: Cardiovascular;  Laterality: N/A;  . CEREBRAL ANEURYSM REPAIR       A IV Location/Drains/Wounds Patient Lines/Drains/Airways Status   Active Line/Drains/Airways    Name:   Placement date:   Placement time:   Site:   Days:   Peripheral IV 01/27/19 Right Hand   01/27/19    0441    Hand   less than 1   Peripheral IV 01/27/19 Left Hand   01/27/19    0658    Hand   less than 1          Intake/Output Last 24 hours No intake or output data in the 24 hours ending 01/27/19 1220  Labs/Imaging Results for orders placed or performed during the hospital encounter of 01/27/19 (from the past 48 hour(s))  CBG monitoring, ED     Status: Abnormal   Collection Time: 01/27/19  3:50 AM  Result Value Ref Range   Glucose-Capillary >600 (HH) 70 - 99 mg/dL  CBC with Differential     Status: Abnormal   Collection Time: 01/27/19  4:11 AM  Result Value Ref Range   WBC 5.8 4.0 - 10.5 K/uL   RBC 5.43 (H) 3.87 -  5.11 MIL/uL   Hemoglobin 14.1 12.0 - 15.0 g/dL   HCT 44.4 36.0 - 46.0 %   MCV 81.8 80.0 - 100.0 fL   MCH 26.0 26.0 - 34.0 pg   MCHC 31.8 30.0 - 36.0 g/dL   RDW 16.1 (H) 11.5 - 15.5 %   Platelets 349 150 - 400 K/uL   nRBC 0.0 0.0 - 0.2 %   Neutrophils Relative % 57 %   Neutro Abs 3.4 1.7 - 7.7 K/uL   Lymphocytes Relative 33 %   Lymphs Abs 1.9 0.7 - 4.0 K/uL   Monocytes Relative 7 %   Monocytes Absolute 0.4 0.1 - 1.0 K/uL   Eosinophils Relative 2 %   Eosinophils Absolute 0.1 0.0 - 0.5 K/uL   Basophils Relative 1 %   Basophils Absolute 0.0 0.0 - 0.1 K/uL   Immature Granulocytes 0 %   Abs Immature Granulocytes 0.01 0.00 - 0.07 K/uL    Comment: Performed at [facility] [address], [place], [place] [address]   Comprehensive metabolic panel     Status: Abnormal   Collection Time: 01/27/19  4:11 AM  Result Value Ref Range   Sodium 129 (L) 135 - 145 mmol/L    Comment: POST-ULTRACENTRIFUGATION   Potassium 5.6 (H) 3.5 - 5.1 mmol/L   Chloride 94 (L) 98 - 111 mmol/L    Comment: SPECIMEN HEMOLYZED. HEMOLYSIS MAY AFFECT INTEGRITY OF RESULTS.   CO2 18 (L) 22 - 32 mmol/L   Glucose, Bld 666 (HH) 70 - 99 mg/dL    Comment: CRITICAL RESULT CALLED TO, READ BACK BY AND VERIFIED WITH: FLORES B,RN 01/27/19 0503 WAYK    BUN 7 6 - 20 mg/dL   Creatinine, Ser 1.14 (H) 0.44 - 1.00 mg/dL   Calcium 9.2 8.9 - 10.3 mg/dL   Total Protein 6.6 6.5 - 8.1 g/dL   Albumin 3.2 (L) 3.5 - 5.0 g/dL   AST 47 (H) 15 - 41 U/L   ALT 24 0 - 44 U/L   Alkaline Phosphatase 84 38 - 126 U/L   Total Bilirubin 1.2 0.3 - 1.2 mg/dL   GFR calc non Af Amer 56 (L) >60 mL/min   GFR calc Af Amer >60 >60 mL/min   Anion gap 17 (H) 5 - 15    Comment: Performed at [facility] [address], [place], [place] [address]  Magnesium     Status: None   Collection Time: 01/27/19  4:11 AM  Result Value Ref Range   Magnesium 1.9 1.7 - 2.4 mg/dL    Comment: Performed at [facility] [address], [place], [place] [address]  Phosphorus     Status: None   Collection Time: 01/27/19  4:11 AM  Result Value Ref Range   Phosphorus 3.0 2.5 - 4.6 mg/dL    Comment: Performed at [facility] [address], [place], [place] [address]  Lipase, blood     Status: None   Collection Time: 01/27/19  4:11 AM  Result Value Ref Range   Lipase 26 11 - 51 U/L    Comment: Performed at [facility] [address] [place], [place] [address]  POCT I-Stat EG7     Status: Abnormal   Collection Time: 01/27/19  4:22 AM  Result Value Ref Range   pH, Ven 7.311 7.250 - 7.430   pCO2, Ven 52.8 44.0 - 60.0 mmHg   pO2, Ven 89.0 (H) 32.0 - 45.0 mmHg   Bicarbonate  26.6 20.0 - 28.0 mmol/L   TCO2 28 22 - 32 mmol/L   O2 Saturation 96.0 %   Acid-base  deficit 1.0 0.0 - 2.0 mmol/L   Sodium 130 (L) 135 - 145 mmol/L   Potassium 4.4 3.5 - 5.1 mmol/L   Calcium, Ion 1.20 1.15 - 1.40 mmol/L   HCT 46.0 36.0 - 46.0 %   Hemoglobin 15.6 (H) 12.0 - 15.0 g/dL   Patient temperature HIDE    Sample type VENOUS   CBG monitoring, ED     Status: Abnormal   Collection Time: 01/27/19  7:37 AM  Result Value Ref Range   Glucose-Capillary 389 (H) 70 - 99 mg/dL  Urinalysis, Routine w reflex microscopic     Status: Abnormal   Collection Time: 01/27/19  8:17 AM  Result Value Ref Range   Color, Urine YELLOW YELLOW   APPearance HAZY (A) CLEAR   Specific Gravity, Urine 1.036 (H) 1.005 - 1.030   pH 5.0 5.0 - 8.0   Glucose, UA >=500 (A) NEGATIVE mg/dL   Hgb urine dipstick SMALL (A) NEGATIVE   Bilirubin Urine NEGATIVE NEGATIVE   Ketones, ur NEGATIVE NEGATIVE mg/dL   Protein, ur NEGATIVE NEGATIVE mg/dL   Nitrite NEGATIVE NEGATIVE   Leukocytes,Ua NEGATIVE NEGATIVE   RBC / HPF 0-5 0 - 5 RBC/hpf   WBC, UA 6-10 0 - 5 WBC/hpf   Bacteria, UA RARE (A) NONE SEEN   Squamous Epithelial / LPF 6-10 0 - 5   Mucus PRESENT    Budding Yeast PRESENT     Comment: Performed at [facility], [address] [place], [place] [address]  CBG monitoring, ED     Status: Abnormal   Collection Time: 01/27/19  8:56 AM  Result Value Ref Range   Glucose-Capillary 356 (H) 70 - 99 mg/dL  CBG monitoring, ED     Status: Abnormal   Collection Time: 01/27/19  9:07 AM  Result Value Ref Range   Glucose-Capillary 357 (H) 70 - 99 mg/dL   Comment 1 Notify RN    Comment 2 Document in Chart   HIV Antibody (routine testing w rflx)     Status: None   Collection Time: 01/27/19  9:59 AM  Result Value Ref Range   HIV Screen 4th Generation wRfx NON REACTIVE NON REACTIVE    Comment: Performed at [facility], [place] [address] [place], [place] [id number]  Basic metabolic panel     Status: Abnormal   Collection Time: 01/27/19  9:59 AM  Result Value Ref Range   Sodium 132 (L) 135 - 145  mmol/L   Potassium 3.7 3.5 - 5.1 mmol/L   Chloride 101 98 - 111 mmol/L   CO2 21 (L) 22 - 32 mmol/L   Glucose, Bld 344 (H) 70 - 99 mg/dL   BUN 6 6 - 20 mg/dL   Creatinine, Ser 0.78 0.44 - 1.00 mg/dL   Calcium 8.5 (L) 8.9 - 10.3 mg/dL   GFR calc non Af Amer >60 >60 mL/min   GFR calc Af Amer >60 >60 mL/min   Anion gap 10 5 - 15    Comment: Performed at [facility], [place] [address] [place], [place] [address]   No results found.  Pending Labs Unresulted Labs (From admission, onward)    Start     Ordered   01/28/19 0500  CBC  Tomorrow morning,   R     01/27/19 0753   01/28/19 [id number]  Basic metabolic panel  Tomorrow morning,   R     01/27/19 0753   01/27/19 0814  TSH  Add-on,   AD     01/27/19 [id number]   01/27/19 0000000  Basic metabolic panel  Now then every 4 hours,   R     01/27/19 0753   01/27/19 0751  HIV4GL Save Tube  (HIV Antibody (Routine testing w reflex) panel)  Once,   STAT     01/27/19 0753   01/27/19 0708  SARS CORONAVIRUS 2 (TAT 6-24 HRS) Nasopharyngeal Nasopharyngeal Swab  (Asymptomatic/Tier 2)  Once,   STAT    Question Answer Comment  Is this test for diagnosis or screening Screening   Symptomatic for COVID-19 as defined by CDC No   Hospitalized for COVID-19 No   Admitted to ICU for COVID-19 No   Previously tested for COVID-19 No   Resident in a congregate (group) care setting No   Employed in healthcare setting No   Pregnant No      01/27/19 0707          Vitals/Pain Today's Vitals   01/27/19 0913 01/27/19 1030 01/27/19 1115 01/27/19 1146  BP: 124/83 (!) 136/93 116/77   Pulse: 70 77 76   Resp: (!) 22 16 (!) 25   Temp:      TempSrc:      SpO2: 96% 95% 97%   Weight:      Height:      PainSc:    0-No pain    Isolation Precautions No active isolations  Medications Medications  dextrose 5 %-0.45 % sodium chloride infusion (has no administration in time range)  insulin regular bolus via infusion 0-10 Units (0 Units Intravenous Not Given 01/27/19  0813)  insulin regular, human (MYXREDLIN) 100 units/ 100 mL infusion (5.1 Units/hr Intravenous Rate/Dose Change 01/27/19 1218)  dextrose 50 % solution 25 mL (has no administration in time range)  enoxaparin (LOVENOX) injection 40 mg (has no administration in time range)  sodium chloride flush (NS) 0.9 % injection 3 mL (has no administration in time range)  acetaminophen (TYLENOL) tablet 650 mg (has no administration in time range)    Or  acetaminophen (TYLENOL) suppository 650 mg (has no administration in time range)  0.9 %  sodium chloride infusion ( Intravenous New Bag/Given 01/27/19 0812)  sodium chloride 0.9 % bolus 1,000 mL (0 mLs Intravenous Stopped 01/27/19 0718)    Mobility walks Low fall risk   Focused Assessments    R Recommendations: See Admitting Provider Note  Report given to:   Additional Notes:

## 2019-01-27 NOTE — Progress Notes (Signed)
Nursing report patient to have required 45 units of insulin prior to transition to subcutaneous insulin.Transitioned patient to Levemir 10 units daily with sensitive SSI.  Will need to adjust insulin regimen as needed.

## 2019-01-27 NOTE — ED Notes (Signed)
CBG 229 

## 2019-01-27 NOTE — ED Notes (Signed)
CBG- 340 

## 2019-01-27 NOTE — ED Notes (Signed)
Breakfast ordered 

## 2019-01-27 NOTE — ED Notes (Signed)
Unable to drawn back blood from IV in R hand, 2 phlebotomists attempted x5 per phlebotomy.

## 2019-01-27 NOTE — ED Notes (Signed)
Verified insulin with [facility]

## 2019-01-27 NOTE — H&P (Addendum)
History and Physical    Jessica Cruz [id number] DOB: Oct 09, 1965 DOA: 01/27/2019  Referring MD/NP/PA: Martie Lee, MD PCP: Glendale Chard, MD  Patient coming from: Home  Chief Complaint: Not feeling well  I have personally briefly reviewed patient's old medical records in [facility]   HPI: Jessica Cruz is a 53 y.o. female with medical history significant of hypertension, hyperlipidemia, diabetes mellitus type 2, NSTEMI, brain aneurysm s/p clipping, TIA, and microcytic anemia; who presents with complaints of not feeling well over the last 2 months or so.  She complains of having decreased overall appetite.  Notes associated symptoms of nausea, vomiting, malaise, bad taste in her mouth, urinary frequency, polydipsia, intermittent epigastric abdominal pain, shortness of breath with exertion, increased lethargy, and weight loss of approximately 35 pounds.  Emesis was noted to be nonbloody in appearance and last occurred yesterday when she tried to eat.  Notes previously being told that hemoglobin A1c was 7, and advised to start on metformin.  However, patient declined at that time as family members stated that it caused abdominal pain and diarrhea symptoms.  The plan was to place her on some kind of shot but her insurance declined this.  Denies having any significant fever, sweats, dysuria, chest pain, or diarrhea.  Patient had not been monitoring her blood sugars at home.  ED Course: Upon admission into the emergency department patient was seen to be afebrile, blood pressure 129/93, and all other vital signs maintained.  Labs significant for sodium 129, potassium 5.6, chloride 94, CO2 18, BUN 7, creatinine 1.14, glucose 666, and anion gap 17.  Venous pH was within normal limits at 7.311, PCO2 52.8, and PO2 89.  COVID-19 screening negative.  Patient was given 1 L normal saline IV fluids and placed on insulin drip.  Urinalysis had not been obtained yet.  TRH called to admit.    Past Medical  History:  Diagnosis Date  . Anxiety   . Brain aneurysm    a. h/o intracranial aneurysm rupture (clipped [facility] 2007).  . Essential hypertension   . Fibroids   . Hyperlipidemia   . Hypertensive heart disease   . Liver nodule   . Menorrhagia   . Microcytic anemia   . Non-ST elevation myocardial infarction (NSTEMI), type 2    a. demand ischemia 01/2016 in setting of accelerated HTN, anemia - normal cors by cath.  . Obesity   . Pre-diabetes   . Stroke [facility])     Past Surgical History:  Procedure Laterality Date  . CARDIAC CATHETERIZATION N/A 02/16/2016   Procedure: Left Heart Cath and Coronary Angiography;  Surgeon: Wellington Hampshire, MD;  Location: [place] [facility];  Service: Cardiovascular;  Laterality: N/A;  . CEREBRAL ANEURYSM REPAIR       reports that she has never smoked. She has never used smokeless tobacco. She reports current alcohol use. She reports that she does not use drugs.  Allergies  Allergen Reactions  . Pineapple Itching and Swelling    TONGUE AND THROAT AFFECTED  . Shellfish Allergy Itching and Swelling    THROAT AND TONGUE ARE AFFECTED    Family History  Problem Relation Age of Onset  . Heart attack Father 75  . Stroke Father   . Diabetes Mother   . Hypertension Mother   . Heart disease Maternal Grandmother   . Stroke Maternal Grandfather   . Heart attack Paternal Grandmother   . Heart disease Paternal Grandmother   . Heart failure Paternal Grandmother   .  Heart failure Paternal Grandfather   . Heart disease Paternal Grandfather   . Heart attack Paternal Grandfather   . Stroke Paternal Grandfather     Prior to Admission medications   Medication Sig Start Date End Date Taking? Authorizing Provider  acetaminophen (TYLENOL) 650 MG CR tablet Take 650 mg by mouth 2 (two) times daily as needed for pain.    [provider]  aspirin 325 MG tablet Take 1 tablet (325 mg total) by mouth daily. 10/10/16   Thurnell Lose, MD  atorvastatin (LIPITOR)  40 MG tablet Take 1 tablet (40 mg total) by mouth daily. 09/18/18   [name], FNP  carvedilol (COREG) 12.5 MG tablet Take 1 tablet (12.5 mg total) by mouth 2 (two) times daily with a meal. 09/18/18   [name], FNP  ferrous sulfate 325 (65 FE) MG tablet Take 1 tablet (325 mg total) by mouth 2 (two) times daily with a meal. 02/18/16   Ghimire, Henreitta Leber, MD  gabapentin (NEURONTIN) 100 MG capsule Take 2 tablets at bedtime 09/18/18   [name], FNP  hydrALAZINE (APRESOLINE) 50 MG tablet Take 1 tablet (50 mg total) by mouth every 8 (eight) hours. 09/18/18   [name], FNP  losartan (COZAAR) 50 MG tablet Take 1 tablet (50 mg total) by mouth daily. 09/18/18   [name], FNP    Physical Exam:  Constitutional: NAD, calm, comfortable Vitals:   01/27/19 0338 01/27/19 0715  BP: 133/90   Pulse: 93 69  Resp: 17 10  Temp: 98.4 F (36.9 C)   TempSrc: Oral   SpO2:  99%  Weight: 96.6 kg   Height: 5\' 6"  (1.676 m)    Eyes: PERRL, lids and conjunctivae normal ENMT: Mucous membranes are moist. Posterior pharynx clear of any exudate or lesions.Normal dentition.  Neck: normal, supple, no masses, no thyromegaly Respiratory: clear to auscultation bilaterally, no wheezing, no crackles. Normal respiratory effort. No accessory muscle use.  Cardiovascular: Regular rate and rhythm, no murmurs / rubs / gallops. No extremity edema. 2+ pedal pulses. No carotid bruits.  Abdomen: no tenderness, no masses palpated. No hepatosplenomegaly. Bowel sounds positive.  Musculoskeletal: no clubbing / cyanosis. No joint deformity upper and lower extremities. Good ROM, no contractures. Normal muscle tone.  Skin: no rashes, lesions, ulcers. No induration Neurologic: CN 2-12 grossly intact. Sensation intact, DTR normal. Strength 5/5 in all 4.  Psychiatric: Normal judgment and insight. Alert and oriented x 3. Normal mood.     Labs on Admission: I have personally reviewed following labs and imaging studies  CBC:  Recent Labs  Lab 01/27/19 0411 01/27/19 0422  WBC 5.8  --   NEUTROABS 3.4  --   HGB 14.1 15.6*  HCT 44.4 46.0  MCV 81.8  --   PLT 349  --    Basic Metabolic Panel: Recent Labs  Lab 01/27/19 0411 01/27/19 0422  NA 129* 130*  K 5.6* 4.4  CL 94*  --   CO2 18*  --   GLUCOSE 666*  --   BUN 7  --   CREATININE 1.14*  --   CALCIUM 9.2  --   MG 1.9  --   PHOS 3.0  --    GFR: Estimated Creatinine Clearance: 68.4 mL/min (A) (by C-G formula based on SCr of 1.14 mg/dL (H)). Liver Function Tests: Recent Labs  Lab 01/27/19 0411  AST 47*  ALT 24  ALKPHOS 84  BILITOT 1.2  PROT 6.6  ALBUMIN 3.2*   Recent Labs  Lab 01/27/19 0411  LIPASE 26   No results for input(s): AMMONIA in the last 168 hours. Coagulation Profile: No results for input(s): INR, PROTIME in the last 168 hours. Cardiac Enzymes: No results for input(s): CKTOTAL, CKMB, CKMBINDEX, TROPONINI in the last 168 hours. BNP (last 3 results) No results for input(s): PROBNP in the last 8760 hours. HbA1C: No results for input(s): HGBA1C in the last 72 hours. CBG: Recent Labs  Lab 01/27/19 0350  GLUCAP >600*   Lipid Profile: No results for input(s): CHOL, HDL, LDLCALC, TRIG, CHOLHDL, LDLDIRECT in the last 72 hours. Thyroid Function Tests: No results for input(s): TSH, T4TOTAL, FREET4, T3FREE, THYROIDAB in the last 72 hours. Anemia Panel: No results for input(s): VITAMINB12, FOLATE, FERRITIN, TIBC, IRON, RETICCTPCT in the last 72 hours. Urine analysis:    Component Value Date/Time   COLORURINE YELLOW 07/29/2013 0507   APPEARANCEUR CLOUDY (A) 07/29/2013 0507   LABSPEC 1.034 (H) 07/29/2013 0507   PHURINE 5.5 07/29/2013 0507   GLUCOSEU NEGATIVE 07/29/2013 0507   HGBUR LARGE (A) 07/29/2013 0507   BILIRUBINUR NEGATIVE 07/29/2013 0507   KETONESUR 15 (A) 07/29/2013 0507   PROTEINUR 30 (A) 07/29/2013 0507   UROBILINOGEN 1.0 07/29/2013 0507   NITRITE NEGATIVE 07/29/2013 0507   LEUKOCYTESUR NEGATIVE 07/29/2013  0507   Sepsis Labs: No results found for this or any previous visit (from the past 240 hour(s)).   Radiological Exams on Admission: No results found.  EKG: Independently reviewed. Sinus rhythm at 78 bpm  Assessment/Plan Suspected DKA, type II: Acute.  Patient presents complaints of weight loss and elevated blood sugars.  Blood work revealed glucose 666, CO2 18, and anion gap 17.  Venous pH within normal limits.  Pseudohyponatremia noted with sodium 129.  Patient not on any diabetic medications, but last hemoglobin A1c 8 on 09/12/2018.  Patient was given 1 L normal saline IV fluids and started on the glucose stabilizer protocol. - Admit to stepdown unit  - Glucose stabilizer protocol initiated  - Check serial BMPs, hemoglobin A1c   - Correct electrolytes as needed - Monitoring for AG closure and will transition to subcutaneous insulin once able - Diabetes education consult  Hyperkalemia: Acute.  Potassium elevated at 5.6 on admission.  -Check EKG -Continue IV fluids and insulin -Continue to monitor potassium  Acute kidney injury: Patient's baseline creatinine previously noted to be around 0.8, but presents with creatinine elevated up to 1.14 and BUN 7.  Suspect prerenal in nature given significant hyperglycemia. -IV fluids as seen above -Continue to monitor kidney function  Essential hypertension: Blood pressure currently relatively stable.  Home medications appear to include hydralazine, Coreg, and losartan. -Continue hydralazine and Coreg -Restart losartan when medically appropriate  Hyperlipidemia -Continue atorvastatin  Weight loss: Weight loss of approximately 35 pounds over the last few months.  Suspect secondary to uncontrolled diabetes. -Follow-up TSH  Nausea, vomiting, epigastric abdominal pain: Patient complains of intermittent epigastric pain with nausea and vomiting.  Suspect could be related with elevated blood glucose and DKA. -Advance diet as tolerated to carb  modified -Protonix   DVT prophylaxis: Lovenox Code Status: Full Family Communication: No family present at bedside Disposition Plan: Possible discharge home in 1 to 2 days Consults called: Diabetic education Admission status: Observation  Norval Morton MD [facility] Pager [phone]   If 7PM-7AM, please contact night-coverage [email or web address] Password [facility]  01/27/2019, 7:37 AM

## 2019-01-27 NOTE — ED Triage Notes (Signed)
Pt says that since about the middle of August, she has had intermittent stomach pain with nausea. She has not been eating much, has a metal taste in her mouth. Feels weak because she is not eating. More thirsty than normal, urinating more often. Lost 35lbs since August without trying. CBG HI reading in triage.

## 2019-01-28 DIAGNOSIS — E1110 Type 2 diabetes mellitus with ketoacidosis without coma: Secondary | ICD-10-CM | POA: Diagnosis present

## 2019-01-28 LAB — GLUCOSE, CAPILLARY
Glucose-Capillary: 325 mg/dL — ABNORMAL HIGH (ref 70–99)
Glucose-Capillary: 368 mg/dL — ABNORMAL HIGH (ref 70–99)
Glucose-Capillary: 341 mg/dL — ABNORMAL HIGH (ref 70–99)
Glucose-Capillary: 278 mg/dL — ABNORMAL HIGH (ref 70–99)

## 2019-01-28 LAB — BASIC METABOLIC PANEL
Anion gap: 11 (ref 5–15)
BUN: 9 mg/dL (ref 6–20)
CO2: 21 mmol/L — ABNORMAL LOW (ref 22–32)
Calcium: 8.4 mg/dL — ABNORMAL LOW (ref 8.9–10.3)
Chloride: 99 mmol/L (ref 98–111)
Creatinine, Ser: 0.78 mg/dL (ref 0.44–1.00)
GFR calc Af Amer: >60 mL/min (ref >60)
GFR calc non Af Amer: >60 mL/min (ref >60)
Glucose, Bld: 379 mg/dL — ABNORMAL HIGH (ref 70–99)
Potassium: 4.3 mmol/L (ref 3.5–5.1)
Sodium: 131 mmol/L — ABNORMAL LOW (ref 135–145)

## 2019-01-28 LAB — HEMOGLOBIN A1C
Hgb A1c MFr Bld: 17.3 % — ABNORMAL HIGH (ref 4.8–5.6)
Mean Plasma Glucose: 449.81 mg/dL

## 2019-01-28 LAB — CBC
HCT: 37.9 % (ref 36.0–46.0)
Hemoglobin: 11.9 g/dL — ABNORMAL LOW (ref 12.0–15.0)
MCH: 25.2 pg — ABNORMAL LOW (ref 26.0–34.0)
MCHC: 31.4 g/dL (ref 30.0–36.0)
MCV: 80.1 fL (ref 80.0–100.0)
Platelets: 229 10*3/uL (ref 150–400)
RBC: 4.73 MIL/uL (ref 3.87–5.11)
RDW: 15.9 % — ABNORMAL HIGH (ref 11.5–15.5)
WBC: 4.9 10*3/uL (ref 4.0–10.5)
nRBC: 0 % (ref 0.0–0.2)

## 2019-01-28 MED ORDER — INSULIN ASPART 100 UNIT/ML ~~LOC~~ SOLN
6.0000 [IU] | Freq: Three times a day (TID) | SUBCUTANEOUS | Status: DC
  Administered 2019-01-28 – 2019-01-29 (×3): 6 [IU] via SUBCUTANEOUS

## 2019-01-28 MED ORDER — LIVING WELL WITH DIABETES BOOK
Freq: Once | Status: AC
  Administered 2019-01-28: 17:00:00
  Filled 2019-01-28: qty 1

## 2019-01-28 MED ORDER — INSULIN DETEMIR 100 UNIT/ML ~~LOC~~ SOLN
20.0000 [IU] | Freq: Every day | SUBCUTANEOUS | Status: DC
  Administered 2019-01-28 – 2019-01-29 (×2): 20 [IU] via SUBCUTANEOUS
  Filled 2019-01-28 (×2): qty 0.2

## 2019-01-28 NOTE — TOC Initial Note (Addendum)
Transition of Care [facility]) - Initial/Assessment Note    Patient Details  Name: Jessica Cruz MRN: [id number] Date of Birth: February 22, 1966  Transition of Care [facility]) CM/SW Contact:    Bartholomew Crews, RN Phone Number: [phone] 01/28/2019, 1:51 PM  Clinical Narrative:                 Received message that patient needed assistance with diabetes medications. Match created and [facility] pharmacy notified - patient will need prescriptions for levemir, pen needles, glucose meter, and testing supplies sent to [place]. Message left with PCP requesting call back in order to schedule hospital follow up. Application for medication assistance printed and will provide to patient. Following for transition of care needs.   Spoke with patient at the bedside. Discussed getting medications through the transition of care pharmacy with a match - patient very appreciative. Provided application for levemir assistance and encouraged to complete her part and drop off at PCP to complete and fax - patient verbalized understanding. Attempts x 2 to schedule hosptal follow up with PCP office - left voicemail x 2 with CM contact information.        Patient Goals and CMS Choice        Expected Discharge Plan and Services                                                Prior Living Arrangements/Services                       Activities of Daily Living Home Assistive Devices/Equipment: None ADL Screening (condition at time of admission) Patient's cognitive ability adequate to safely complete daily activities?: Yes Is the patient deaf or have difficulty hearing?: No Does the patient have difficulty seeing, even when wearing glasses/contacts?: No Does the patient have difficulty concentrating, remembering, or making decisions?: Yes Patient able to express need for assistance with ADLs?: Yes Does the patient have difficulty dressing or bathing?: No Independently performs ADLs?: Yes (appropriate for  developmental age) Does the patient have difficulty walking or climbing stairs?: No Weakness of Legs: None Weakness of Arms/Hands: None  Permission Sought/Granted                  Emotional Assessment              Admission diagnosis:  Diabetic ketoacidosis without coma associated with type 2 diabetes mellitus ([place]) [E11.10] Patient Active Problem List   Diagnosis Date Noted  . DKA, type 2 ([place]) 01/27/2019  . Hyperkalemia 01/27/2019  . AKI (acute kidney injury) ([place]) 01/27/2019  . Weight loss 01/27/2019  . Vaginal discharge 04/05/2018  . Abnormal glucose 04/05/2018  . Controlled type 2 diabetes mellitus with hyperglycemia ([place]) 01/02/2018  . Anxiety 01/02/2018  . TIA (transient ischemic attack) 10/08/2016  . Expressive aphasia 10/08/2016  . Right hemiparesis ([place]) 10/08/2016  . Chest discomfort 10/08/2016  . Hypertensive urgency 10/08/2016  . History of cerebral aneurysm repair 10/08/2016  . Pre-diabetes   . Obesity   . Hypertensive heart disease   . Microcytic anemia   . Liver nodule   . Essential hypertension   . Hyperlipidemia   . Iron deficiency anemia due to chronic blood loss   . ACS (acute coronary syndrome) ([place]) 02/16/2016  . Hypertension, uncontrolled 02/16/2016  . Hypertensive emergency   . Hypoxemia   .  Chest pain   . NSTEMI (non-ST elevated myocardial infarction) [facility])    PCP:  Glendale Chard, MD Pharmacy:   [place], [place] [address] Phone: [phone] Fax: [phone]  [facility] [place] [place] - [address] [place] [address] Phone: [phone] Fax: [phone]     Social Determinants of Health (SDOH) Interventions    Readmission Risk Interventions No flowsheet data found.

## 2019-01-28 NOTE — Progress Notes (Signed)
Inpatient Diabetes Program Recommendations  AACE/ADA: New Consensus Statement on Inpatient Glycemic Control (2015)  Target Ranges:  Prepandial:   less than 140 mg/dL      Peak postprandial:   less than 180 mg/dL (1-2 hours)      Critically ill patients:  140 - 180 mg/dL   Lab Results  Component Value Date   GLUCAP 368 (H) 01/28/2019   HGBA1C 17.3 (H) 01/28/2019    Review of Glycemic Control  Blood sugars today - 325, 368 mg/dL. Received 16 units of Novolog thus far today, along with Levemir 20 units.  Long discussion with pt regarding uncontrolled DM with HgbA1C of 17.3%. Discussed importance of checking blood sugars at least 3-4x/day and following up with PCP. Stopped taking metformin because Mom and daughter had GI issues with it, and she did not want to take. Will go home on Levemir and Novolog pens. Discussed lifestyle modifications with weight loss and exercise, healthy eating with portion control.  Care manager has discussed medication assistance with pt.  Prefers insulin pens vs vial/syringe.   Titrating amount at present.   Inpatient Diabetes Program Recommendations:     Increase Levemir to 25 units QD Add meal coverage insulin - Novolog 6 units tidwc  Will f/u in am. Secure text to MD.  Thank you. Lorenda Peck, RD, LDN, CDE Inpatient Diabetes Coordinator [phone]

## 2019-01-28 NOTE — TOC Progression Note (Addendum)
Transition of Care [facility]) - Progression Note    Patient Details  Name: Jessica Cruz MRN: [id number] Date of Birth: [birth year]  Transition [facility] Care [facility]) CM/SW Contact  Bartholomew Crews, RN Phone Number: [phone] 01/28/2019, 4:22 PM  Clinical Narrative:    Received call back from [facility]. Patient has hospital follow up scheduled for 10/27 at 3:30 pm. Patient will be able to use [facility] pharmacy after discharge. Advised PCP that patient has paperwork to complete to assist with prescription assistance for diabetes medication that will need MD documentation and assistance with faxing.  Spoke with patient at the bedside to advise of pending appointment. Patient agreed to complete her part of application and deliver to MD for completion and faxing. TOC to fill prescriptions for insulin pen(s), glucose meter, diabetes supplies, and pen needles.     Expected Discharge Plan: Home/Self Care Barriers to Discharge: Continued Medical Work up  Expected Discharge Plan and Services Expected Discharge Plan: Home/Self Care In-house Referral: Development worker, community, Clinical Social Work Discharge Planning Services: CM Consult, [place] [facility] Post Acute Care Choice: NA Living arrangements for the past 2 months: Apartment                 DME Arranged: Diabetic Supplies, Glucometer DME Agency: Other - Comment(Transition of Care pharmacy)       HH Arranged: NA [place] Agency: NA         Social Determinants of Health (SDOH) Interventions    Readmission Risk Interventions No flowsheet data found.

## 2019-01-28 NOTE — Progress Notes (Signed)
Progress Note    Jessica Cruz  [id number] DOB: May 31, 1965  DOA: 01/27/2019 PCP: Glendale Chard, MD    Brief Narrative:     Medical records reviewed and are as summarized below:  Jessica Cruz is an 53 y.o. female with medical history significant of hypertension, hyperlipidemia, diabetes mellitus type 2, NSTEMI, brain aneurysm s/p clipping, TIA, and microcytic anemia; who presents with complaints of not feeling well over the last 2 months or so.  She complains of having decreased overall appetite.  Notes associated symptoms of nausea, vomiting, malaise, bad taste in her mouth, urinary frequency, polydipsia, intermittent epigastric abdominal pain, shortness of breath with exertion, increased lethargy, and weight loss of approximately 35 pounds.   Assessment/Plan:   Principal Problem:   DKA, type 2 ([place]) Active Problems:   Essential hypertension   Hyperlipidemia   Hyperkalemia   AKI (acute kidney injury) ([place])   Weight loss   Suspected DKA, type II: Acute.   HgbA1c: 17 transitioned off insulin gtt to levemir -diabetic coordinator consult appreciated.  Recommendations: if FBS > 180 mg/dL, would increase Levemir to 25 units. Also needs meal coverage insulin - Novolog 6 units tidwc if pt eats > 50% meal  Hyperkalemia: Acute.  Potassium elevated at 5.6 on admission.  -resolved  Acute kidney injury:  -resolved  Essential hypertension: Blood pressure currently relatively stable.  Home medications appear to include hydralazine, Coreg, and losartan. -Continue hydralazine and Coreg  Hyperlipidemia -Continue atorvastatin  Nausea, vomiting, epigastric abdominal pain: Patient complains of intermittent epigastric pain with nausea and vomiting.  Suspect could be related with elevated blood glucose and DKA. -resolved  obesity Body mass index is 34.38 kg/m.   Family Communication/Anticipated D/C date and plan/Code Status   DVT prophylaxis: Lovenox ordered. Code Status:  Full Code.  Family Communication:  Disposition Plan: home in AM after blood sugar medications adjusted   Medical Consultants:    None.     Subjective:   No current complaints  Objective:    Vitals:   01/28/19 0554 01/28/19 0707 01/28/19 0750 01/28/19 0931  BP: 130/81 (!) 149/107 (!) 128/93 (!) 120/91  Pulse: 68  70 67  Resp: 18     Temp: 97.9 F (36.6 C)     TempSrc: Oral     SpO2: 99%     Weight:      Height:        Intake/Output Summary (Last 24 hours) at 01/28/2019 1439 Last data filed at 01/28/2019 0819 Gross per 24 hour  Intake 1405.13 ml  Output -  Net 1405.13 ml   Filed Weights   01/27/19 0338  Weight: 96.6 kg    Exam: In bed, NAD rrr No increased work of breathing A+Ox3  Data Reviewed:   I have personally reviewed following labs and imaging studies:  Labs: Labs show the following:   Basic Metabolic Panel: Recent Labs  Lab 01/27/19 0411 01/27/19 0422 01/27/19 0959 01/27/19 1520 01/28/19 0248  NA 129* 130* 132* 135 131*  K 5.6* 4.4 3.7 3.8 4.3  CL 94*  --  101 102 99  CO2 18*  --  21* 22 21*  GLUCOSE 666*  --  344* 195* 379*  BUN 7  --  6 9 9   CREATININE 1.14*  --  0.78 0.74 0.78  CALCIUM 9.2  --  8.5* 8.4* 8.4*  MG 1.9  --   --   --   --   PHOS 3.0  --   --   --   --  GFR Estimated Creatinine Clearance: 97.5 mL/min (by C-G formula based on SCr of 0.78 mg/dL). Liver Function Tests: Recent Labs  Lab 01/27/19 0411  AST 47*  ALT 24  ALKPHOS 84  BILITOT 1.2  PROT 6.6  ALBUMIN 3.2*   Recent Labs  Lab 01/27/19 0411  LIPASE 26   No results for input(s): AMMONIA in the last 168 hours. Coagulation profile No results for input(s): INR, PROTIME in the last 168 hours.  CBC: Recent Labs  Lab 01/27/19 0411 01/27/19 0422 01/28/19 0248  WBC 5.8  --  4.9  NEUTROABS 3.4  --   --   HGB 14.1 15.6* 11.9*  HCT 44.4 46.0 37.9  MCV 81.8  --  80.1  PLT 349  --  229   Cardiac Enzymes: No results for input(s): CKTOTAL,  CKMB, CKMBINDEX, TROPONINI in the last 168 hours. BNP (last 3 results) No results for input(s): PROBNP in the last 8760 hours. CBG: Recent Labs  Lab 01/27/19 1416 01/27/19 1615 01/27/19 2130 01/28/19 0722 01/28/19 1215  GLUCAP 179* 201* 369* 325* 368*   D-Dimer: No results for input(s): DDIMER in the last 72 hours. Hgb A1c: Recent Labs    01/28/19 0248  HGBA1C 17.3*   Lipid Profile: No results for input(s): CHOL, HDL, LDLCALC, TRIG, CHOLHDL, LDLDIRECT in the last 72 hours. Thyroid function studies: Recent Labs    01/27/19 1520  TSH 3.407   Anemia work up: No results for input(s): VITAMINB12, FOLATE, FERRITIN, TIBC, IRON, RETICCTPCT in the last 72 hours. Sepsis Labs: Recent Labs  Lab 01/27/19 0411 01/28/19 0248  WBC 5.8 4.9    Microbiology Recent Results (from the past 240 hour(s))  SARS CORONAVIRUS 2 (TAT 6-24 HRS) Nasopharyngeal Nasopharyngeal Swab     Status: None   Collection Time: 01/27/19  8:06 AM   Specimen: Nasopharyngeal Swab  Result Value Ref Range Status   SARS Coronavirus 2 NEGATIVE NEGATIVE Final    Comment: (NOTE) SARS-CoV-2 target nucleic acids are NOT DETECTED. The SARS-CoV-2 RNA is generally detectable in upper and lower respiratory specimens during the acute phase of infection. Negative results do not preclude SARS-CoV-2 infection, do not rule out co-infections with other pathogens, and should not be used as the sole basis for treatment or other patient management decisions. Negative results must be combined with clinical observations, patient history, and epidemiological information. The expected result is Negative. Fact Sheet for Patients: SugarRoll.be Fact Sheet for Healthcare Providers: https://www.woods-mathews.com/ This test is not yet approved or cleared by the [place] FDA and  has been authorized for detection and/or diagnosis of SARS-CoV-2 by FDA under an Emergency Use Authorization  (EUA). This EUA will remain  in effect (meaning this test can be used) for the duration of the COVID-19 declaration under Section 56 4(b)(1) of the Act, 21 U.S.C. section 360bbb-3(b)(1), unless the authorization is terminated or revoked sooner. Performed at [facility], [place] [address] [place], [place] [address]     Procedures and diagnostic studies:  No results found.  Medications:   . aspirin  325 mg Oral Daily  . atorvastatin  40 mg Oral Daily  . carvedilol  12.5 mg Oral BID WC  . enoxaparin (LOVENOX) injection  40 mg Subcutaneous Daily  . gabapentin  200 mg Oral QHS  . hydrALAZINE  50 mg Oral Q8H  . insulin aspart  0-9 Units Subcutaneous TID WC  . insulin detemir  20 Units Subcutaneous Daily  . living well with diabetes book   Does not  apply Once  . losartan  50 mg Oral Daily  . pantoprazole  40 mg Oral Daily  . sodium chloride flush  3 mL Intravenous Q12H   Continuous Infusions: . sodium chloride 75 mL/hr at 01/27/19 1635     LOS: 0 days   Geradine Girt  [facility]   How to contact the [facility] Attending or Consulting provider [place] or covering provider during after hours [place], for this patient?  1. Check the care team in [facility] and look for a) attending/consulting TRH provider listed and b) the [facility] team listed 2. Log into [email or web address] and use [place]'s universal password to access. If you do not have the password, please contact the hospital operator. 3. Locate the [facility] provider you are looking for under [facility] and page to a number that you can be directly reached. 4. If you still have difficulty reaching the provider, please page the [facility] (Director on Call) for the Hospitalists listed on amion for assistance.  01/28/2019, 2:39 PM

## 2019-01-29 ENCOUNTER — Telehealth: Payer: Self-pay

## 2019-01-29 LAB — GLUCOSE, CAPILLARY
Glucose-Capillary: 272 mg/dL — ABNORMAL HIGH (ref 70–99)
Glucose-Capillary: 272 mg/dL — ABNORMAL HIGH (ref 70–99)

## 2019-01-29 MED ORDER — INSULIN ASPART 100 UNIT/ML ~~LOC~~ SOLN: 6.0000 [IU] | Freq: Three times a day (TID) | SUBCUTANEOUS | 11 refills | Status: DC

## 2019-01-29 MED ORDER — LOSARTAN POTASSIUM 50 MG PO TABS: 50.0000 mg | ORAL_TABLET | Freq: Every day | ORAL | 0 refills | Status: DC

## 2019-01-29 MED ORDER — GABAPENTIN 100 MG PO CAPS: 200.0000 mg | ORAL_CAPSULE | Freq: Every day | ORAL | 0 refills | Status: DC

## 2019-01-29 MED ORDER — ATORVASTATIN CALCIUM 40 MG PO TABS: 40.0000 mg | ORAL_TABLET | Freq: Every day | ORAL | 0 refills | Status: DC

## 2019-01-29 MED ORDER — INSULIN DETEMIR 100 UNIT/ML ~~LOC~~ SOLN: 25.0000 [IU] | Freq: Every day | SUBCUTANEOUS | 11 refills | Status: DC

## 2019-01-29 MED ORDER — BLOOD GLUCOSE MONITOR KIT: PACK | 0 refills | Status: AC

## 2019-01-29 MED ORDER — PANTOPRAZOLE SODIUM 40 MG PO TBEC: 40.0000 mg | DELAYED_RELEASE_TABLET | Freq: Every day | ORAL | 0 refills | Status: DC

## 2019-01-29 MED ORDER — CARVEDILOL 12.5 MG PO TABS: 12.5000 mg | ORAL_TABLET | Freq: Two times a day (BID) | ORAL | 1 refills | Status: DC

## 2019-01-29 MED ORDER — "INSULIN SYRINGE 30G X 1/2"" 0.5 ML MISC": 0 refills | Status: AC

## 2019-01-29 MED ORDER — HYDRALAZINE HCL 50 MG PO TABS: 50.0000 mg | ORAL_TABLET | Freq: Three times a day (TID) | ORAL | 0 refills | Status: DC

## 2019-01-29 MED FILL — CARVEDILOL 12.5 MG TABLET: 12.5 | 30 days supply | Qty: 60 | Fill #0

## 2019-01-29 MED FILL — LOSARTAN POTASSIUM 50 MG TA: 50 | 30 days supply | Qty: 30 | Fill #0

## 2019-01-29 MED FILL — LEVEMIR 100 UNITS/ML VIAL: 100 | 30 days supply | Qty: 10 | Fill #0

## 2019-01-29 MED FILL — NovoLOG 100 UNIT/ML SOLN: 100 | 30 days supply | Qty: 10 | Fill #0

## 2019-01-29 MED FILL — ULTICARE INS 0.3 ML 30GX1/2: 30G X 1/2" | 30 days supply | Qty: 120 | Fill #0

## 2019-01-29 MED FILL — TRUE METRIX BLOOD GLUCOSE M: W/DEVICE | 20 days supply | Qty: 1 | Fill #0

## 2019-01-29 MED FILL — hydrALAZINE HCL 50 MG TABS: 50 | 30 days supply | Qty: 90 | Fill #0

## 2019-01-29 MED FILL — PANTOPRAZOLE SOD DR 40 MG T: 40 | 21 days supply | Qty: 21 | Fill #0

## 2019-01-29 MED FILL — TRUE METRIX GLUCOSE TEST ST: 30 days supply | Qty: 100 | Fill #0

## 2019-01-29 MED FILL — TRUEplus LANCETS 28G MISC: 30 days supply | Qty: 100 | Fill #0

## 2019-01-29 MED FILL — GABAPENTIN 100 MG CAPSULE: 100 | 30 days supply | Qty: 60 | Fill #0

## 2019-01-29 MED FILL — ATORVASTATIN CALCIUM 40 MG: 40 | 30 days supply | Qty: 30 | Fill #0

## 2019-01-29 NOTE — TOC Transition Note (Signed)
Transition of Care [facility]) - CM/SW Discharge Note   Patient Details  Name: Jessica Cruz MRN: [id number] Date of Birth: [birth year]  Transition of Care [facility]) CM/SW Contact:  Benard Halsted, LCSW Phone Number: 01/29/2019, 1:15 PM   Clinical Narrative:    Patient to discharge home today. Meds sent to [facility] pharmacy to be delivered prior to discharge. No other needs identified at this time.      Barriers to Discharge: Continued Medical Work up   Patient Goals and CMS Choice   CMS Medicare.gov Compare Post Acute Care list provided to:: Patient Choice offered to / list presented to : NA  Discharge Placement                       Discharge Plan and Services In-house Referral: Financial Counselor, Clinical Social Work Discharge Planning Services: CM Consult, [place] Program Post Acute Care Choice: NA          DME Arranged: Diabetic Supplies, Glucometer DME Agency: Other - Comment(Transition of Care pharmacy)       HH Arranged: NA [place] Agency: NA        Social Determinants of Health (SDOH) Interventions     Readmission Risk Interventions No flowsheet data found.

## 2019-01-29 NOTE — Discharge Summary (Signed)
Physician Discharge Summary  Jessica Cruz [id number] DOB: [birth year] DOA: 01/27/2019  PCP: Jessica Chard, MD  Admit date: 01/27/2019 Discharge date: 01/29/2019  Time spent: >25 minutes  Recommendations for Outpatient Follow-up:  PCP In 3-4 days  Discharge Diagnoses:  Principal Problem:   DKA, type 2 ([place]) Active Problems:   Essential hypertension   Hyperlipidemia   Hyperkalemia   AKI (acute kidney injury) ([place])   Weight loss   DKA (diabetic ketoacidoses) ([place])   Discharge Condition: stable   Diet recommendation: carb modified   Filed Weights   01/27/19 0338  Weight: 96.6 kg    History of present illness:   Jessica Cruz an 53 y.o.femalewith medical history significant ofhypertension, hyperlipidemia, diabetes mellitus type 2,NSTEMI,brain aneurysms/pclipping,TIA, andmicrocytic anemia; who presents with complaints of not feeling well over the last 2 months or so. She complains of having decreased overall appetite. Notes associated symptoms of nausea, vomiting, malaise, bad taste in her mouth, urinary frequency, polydipsia, intermittent epigastric abdominal pain, shortness of breath with exertion, increased lethargy, and weight loss of approximately 35 pounds.   Hospital Course:   DKA, type II: Acute.resolved with iv fluids, iv insulin protocol. Transitioned to sq insulin regimen   DM. Uncontrolled. HgbA1c: 17. Started on insulin regimen: levemir+mealtime 6U. D/w patient, recommended to cont f/u with PCP ion 3-5 days to titrate insulin regimen as needed  Hyperkalemia: Acute. Potassium elevated at 5.6 on admission.  -resolved  Acute kidney injury: -resolved  Essential hypertension: Blood pressure currently relatively stable. Home medications appear to include hydralazine, Coreg, and losartan. -Continue hydralazine and Coreg  Hyperlipidemia -Continue atorvastatin  Nausea, vomiting, epigastric abdominal pain. Resolved. Probable due to  DKA  obesity Body mass index is 34.38 kg/m. Cont outpatient follow u p    Procedures:  none (i.e. Studies not automatically included, echos, thoracentesis, etc; not x-rays)  Consultations:  none  Discharge Exam: Vitals:   01/29/19 0519 01/29/19 0520  BP: 134/84 134/84  Pulse:  69  Resp:  16  Temp:  98 F (36.7 C)  SpO2:  100%    General: no distress  Cardiovascular: s1,s2 rrr Respiratory: CTA BL  Discharge Instructions  Discharge Instructions    Ambulatory referral to Nutrition and Diabetic Education   Complete by: As directed    Diet - low sodium heart healthy   Complete by: As directed    Discharge instructions   Complete by: As directed    Please follow up with primary care physician in 3-4 days to titrate insulin regimen   Increase activity slowly   Complete by: As directed      Allergies as of 01/29/2019      Reactions   Pineapple Itching, Swelling   TONGUE AND THROAT AFFECTED   Shellfish Allergy Itching, Swelling   THROAT AND TONGUE ARE AFFECTED      Medication List    STOP taking these medications   ferrous sulfate 325 (65 FE) MG tablet     TAKE these medications   acetaminophen 650 MG CR tablet Commonly known as: TYLENOL Take 650 mg by mouth 2 (two) times daily as needed for pain.   aspirin 325 MG tablet Take 1 tablet (325 mg total) by mouth daily.   atorvastatin 40 MG tablet Commonly known as: LIPITOR Take 1 tablet (40 mg total) by mouth daily.   blood glucose meter kit and supplies Kit Dispense based on patient and insurance preference. Use up to four times daily as directed. (FOR ICD-9 250.00, 250.01).  carvedilol 12.5 MG tablet Commonly known as: COREG Take 1 tablet (12.5 mg total) by mouth 2 (two) times daily with a meal.   gabapentin 100 MG capsule Commonly known as: NEURONTIN Take 2 capsules (200 mg total) by mouth at bedtime. What changed:   how much to take  how to take this  when to take this  additional  instructions   hydrALAZINE 50 MG tablet Commonly known as: APRESOLINE Take 1 tablet (50 mg total) by mouth every 8 (eight) hours.   insulin aspart 100 UNIT/ML injection Commonly known as: novoLOG Inject 6 Units into the skin 3 (three) times daily with meals.   insulin detemir 100 UNIT/ML injection Commonly known as: LEVEMIR Inject 0.25 mLs (25 Units total) into the skin daily. Start taking on: January 30, 2019   INSULIN SYRINGE .5CC/30GX1/2" 30G X 1/2" 0.5 ML Misc As needed   losartan 50 MG tablet Commonly known as: COZAAR Take 1 tablet (50 mg total) by mouth daily.   pantoprazole 40 MG tablet Commonly known as: PROTONIX Take 1 tablet (40 mg total) by mouth daily. Start taking on: January 30, 2019      Allergies  Allergen Reactions  . Pineapple Itching and Swelling    TONGUE AND THROAT AFFECTED  . Shellfish Allergy Itching and Swelling    THROAT AND TONGUE ARE AFFECTED      The results of significant diagnostics from this hospitalization (including imaging, microbiology, ancillary and laboratory) are listed below for reference.    Significant Diagnostic Studies: No results found.  Microbiology: Recent Results (from the past 240 hour(s))  SARS CORONAVIRUS 2 (TAT 6-24 HRS) Nasopharyngeal Nasopharyngeal Swab     Status: None   Collection Time: 01/27/19  8:06 AM   Specimen: Nasopharyngeal Swab  Result Value Ref Range Status   SARS Coronavirus 2 NEGATIVE NEGATIVE Final    Comment: (NOTE) SARS-CoV-2 target nucleic acids are NOT DETECTED. The SARS-CoV-2 RNA is generally detectable in upper and lower respiratory specimens during the acute phase of infection. Negative results do not preclude SARS-CoV-2 infection, do not rule out co-infections with other pathogens, and should not be used as the sole basis for treatment or other patient management decisions. Negative results must be combined with clinical observations, patient history, and epidemiological information.  The expected result is Negative. Fact Sheet for Patients: SugarRoll.be Fact Sheet for Healthcare Providers: https://www.woods-mathews.com/ This test is not yet approved or cleared by the [place] FDA and  has been authorized for detection and/or diagnosis of SARS-CoV-2 by FDA under an Emergency Use Authorization (EUA). This EUA will remain  in effect (meaning this test can be used) for the duration of the COVID-19 declaration under Section 56 4(b)(1) of the Act, 21 U.S.C. section 360bbb-3(b)(1), unless the authorization is terminated or revoked sooner. Performed at [facility], [place] [address], [place], [place] [address]      Labs: Basic Metabolic Panel: Recent Labs  Lab 01/27/19 0411 01/27/19 0422 01/27/19 0959 01/27/19 1520 01/28/19 0248  NA 129* 130* 132* 135 131*  K 5.6* 4.4 3.7 3.8 4.3  CL 94*  --  101 102 99  CO2 18*  --  21* 22 21*  GLUCOSE 666*  --  344* 195* 379*  BUN 7  --  '6 9 9  ' CREATININE 1.14*  --  0.78 0.74 0.78  CALCIUM 9.2  --  8.5* 8.4* 8.4*  MG 1.9  --   --   --   --   PHOS 3.0  --   --   --   --  Liver Function Tests: Recent Labs  Lab 01/27/19 0411  AST 47*  ALT 24  ALKPHOS 84  BILITOT 1.2  PROT 6.6  ALBUMIN 3.2*   Recent Labs  Lab 01/27/19 0411  LIPASE 26   No results for input(s): AMMONIA in the last 168 hours. CBC: Recent Labs  Lab 01/27/19 0411 01/27/19 0422 01/28/19 0248  WBC 5.8  --  4.9  NEUTROABS 3.4  --   --   HGB 14.1 15.6* 11.9*  HCT 44.4 46.0 37.9  MCV 81.8  --  80.1  PLT 349  --  229   Cardiac Enzymes: No results for input(s): CKTOTAL, CKMB, CKMBINDEX, TROPONINI in the last 168 hours. BNP: BNP (last 3 results) No results for input(s): BNP in the last 8760 hours.  ProBNP (last 3 results) No results for input(s): PROBNP in the last 8760 hours.  CBG: Recent Labs  Lab 01/28/19 0722 01/28/19 1215 01/28/19 1627 01/28/19 2105 01/29/19 0756  GLUCAP 325*  368* 341* 278* 272*       Signed:  Rowe Clack N  [facility] 01/29/2019, 11:46 AM

## 2019-01-29 NOTE — Progress Notes (Addendum)
Patient was discharged home per MD order; discharge instructions reviewed and given to patient; Teaching provided to patient on how to check CBG-Glucometer, test strips, lancets all reviewed with patient-Understanding voiced;IVs DIC; skin intact; patient escorted to the car by nurse tech via wheelchair.  Medications and glucometer sent home with patient.

## 2019-01-29 NOTE — Progress Notes (Signed)
[facility] PROGRESS NOTE  VEATRICE Cruz [id number] DOB: [birth year] DOA: 01/27/2019 PCP: Glendale Chard, MD  Brief summary    Jessica Cruz is an 53 y.o. female with medical history significant ofhypertension, hyperlipidemia, diabetes mellitus type 2,NSTEMI,brain aneurysms/pclipping,TIA, andmicrocytic anemia; who presents with complaints of not feeling well over the last 2 months or so. She complains of having decreased overall appetite. Notes associated symptoms of nausea, vomiting, malaise, bad taste in her mouth, urinary frequency, polydipsia, intermittent epigastric abdominal pain, shortness of breath with exertion, increased lethargy, and weight loss of approximately 35 pounds.   Assessment/Plan:  DKA, type II: Acute. resolved with iv fluids, iv insulin protocol. Transitioned to sq insulin regimen   DM. Uncontrolled. HgbA1c: 17. Started on insulin regimen: levemir+mealtime 6U. D/w patient, recommended to cont f/u with PCP ion 3-5 days to titrate insulin regimen as needed  Hyperkalemia: Acute. Potassium elevated at 5.6 on admission.  -resolved  Acute kidney injury:  -resolved  Essential hypertension: Blood pressure currently relatively stable. Home medications appear to include hydralazine, Coreg, and losartan. -Continue hydralazine and Coreg  Hyperlipidemia -Continue atorvastatin  Nausea, vomiting, epigastric abdominal pain. Resolved. Probable due to DKA  obesity Body mass index is 34.38 kg/m. Cont outpatient follow u p   Code Status: full Family Communication: d/w patient, RN (indicate person spoken with, relationship, and if by phone, the number) Disposition Plan: home today    Consultants:  none  Procedures:  none  Antibiotics: Anti-infectives (From admission, onward)   None        (indicate start date, and stop date if known)  HPI/Subjective: No distress. Reports feeling better. Adjusting insulin regimen.    Objective: Vitals:   01/29/19 0519 01/29/19 0520  BP: 134/84 134/84  Pulse:  69  Resp:  16  Temp:  98 F (36.7 C)  SpO2:  100%    Intake/Output Summary (Last 24 hours) at 01/29/2019 1133 Last data filed at 01/29/2019 1100 Gross per 24 hour  Intake 852 ml  Output 1 ml  Net 851 ml   Filed Weights   01/27/19 0338  Weight: 96.6 kg    Exam:   General:  No distress   Cardiovascular: s1,s2 rrr  Respiratory: CTA BL  Abdomen: soft, nt   Musculoskeletal: no leg edema    Data Reviewed: Basic Metabolic Panel: Recent Labs  Lab 01/27/19 0411 01/27/19 0422 01/27/19 0959 01/27/19 1520 01/28/19 0248  NA 129* 130* 132* 135 131*  K 5.6* 4.4 3.7 3.8 4.3  CL 94*  --  101 102 99  CO2 18*  --  21* 22 21*  GLUCOSE 666*  --  344* 195* 379*  BUN 7  --  6 9 9   CREATININE 1.14*  --  0.78 0.74 0.78  CALCIUM 9.2  --  8.5* 8.4* 8.4*  MG 1.9  --   --   --   --   PHOS 3.0  --   --   --   --    Liver Function Tests: Recent Labs  Lab 01/27/19 0411  AST 47*  ALT 24  ALKPHOS 84  BILITOT 1.2  PROT 6.6  ALBUMIN 3.2*   Recent Labs  Lab 01/27/19 0411  LIPASE 26   No results for input(s): AMMONIA in the last 168 hours. CBC: Recent Labs  Lab 01/27/19 0411 01/27/19 0422 01/28/19 0248  WBC 5.8  --  4.9  NEUTROABS 3.4  --   --   HGB 14.1 15.6* 11.9*  HCT 44.4 46.0 37.9  MCV 81.8  --  80.1  PLT 349  --  229   Cardiac Enzymes: No results for input(s): CKTOTAL, CKMB, CKMBINDEX, TROPONINI in the last 168 hours. BNP (last 3 results) No results for input(s): BNP in the last 8760 hours.  ProBNP (last 3 results) No results for input(s): PROBNP in the last 8760 hours.  CBG: Recent Labs  Lab 01/28/19 0722 01/28/19 1215 01/28/19 1627 01/28/19 2105 01/29/19 0756  GLUCAP 325* 368* 341* 278* 272*    Recent Results (from the past 240 hour(s))  SARS CORONAVIRUS 2 (TAT 6-24 HRS) Nasopharyngeal Nasopharyngeal Swab     Status: None   Collection Time: 01/27/19  8:06 AM    Specimen: Nasopharyngeal Swab  Result Value Ref Range Status   SARS Coronavirus 2 NEGATIVE NEGATIVE Final    Comment: (NOTE) SARS-CoV-2 target nucleic acids are NOT DETECTED. The SARS-CoV-2 RNA is generally detectable in upper and lower respiratory specimens during the acute phase of infection. Negative results do not preclude SARS-CoV-2 infection, do not rule out co-infections with other pathogens, and should not be used as the sole basis for treatment or other patient management decisions. Negative results must be combined with clinical observations, patient history, and epidemiological information. The expected result is Negative. Fact Sheet for Patients: SugarRoll.be Fact Sheet for Healthcare Providers: https://www.woods-mathews.com/ This test is not yet approved or cleared by the [place] FDA and  has been authorized for detection and/or diagnosis of SARS-CoV-2 by FDA under an Emergency Use Authorization (EUA). This EUA will remain  in effect (meaning this test can be used) for the duration of the COVID-19 declaration under Section 56 4(b)(1) of the Act, 21 U.S.C. section 360bbb-3(b)(1), unless the authorization is terminated or revoked sooner. Performed at [facility], [place] [address] [place], [place] [address]      Studies: No results found.  Scheduled Meds: . aspirin  325 mg Oral Daily  . atorvastatin  40 mg Oral Daily  . carvedilol  12.5 mg Oral BID WC  . enoxaparin (LOVENOX) injection  40 mg Subcutaneous Daily  . gabapentin  200 mg Oral QHS  . hydrALAZINE  50 mg Oral Q8H  . insulin aspart  0-9 Units Subcutaneous TID WC  . insulin aspart  6 Units Subcutaneous TID WC  . insulin detemir  20 Units Subcutaneous Daily  . losartan  50 mg Oral Daily  . pantoprazole  40 mg Oral Daily  . sodium chloride flush  3 mL Intravenous Q12H   Continuous Infusions:  Principal Problem:   DKA, type 2 (HCC) Active Problems:    Essential hypertension   Hyperlipidemia   Hyperkalemia   AKI (acute kidney injury) ([place])   Weight loss   DKA (diabetic ketoacidoses) ([place])    Time spent: >25 minutes     Kinnie Feil  [facility] Pager [phone]. If 7PM-7AM, please contact night-coverage at [email or web address], password [facility] 01/29/2019, 11:33 AM  LOS: 1 day

## 2019-01-29 NOTE — Progress Notes (Signed)
Patient was able to give self insulin injection with this writer talking her through the process.  Patient states she has not performed her own fingerstick independently yet.

## 2019-01-29 NOTE — Telephone Encounter (Signed)
Transition Care Management Follow-up Telephone Call  Date of discharge and from where: [place]   How have you been since you were released from the hospital? She is ok  Any questions or concerns? no  Items Reviewed:  Did the pt receive and understand the discharge instructions provided? yes  Medications obtained and verified?yes   Any new allergies since your discharge? no  Dietary orders reviewed? Low sodium  Do you have support at home? no  Other (ie: DME, Home Health, etc) no  Functional Questionnaire: (I = Independent and D = Dependent) ADL's: I  Bathing/Dressing- I   Meal Prep- I  Eating- I  Maintaining continence- I  Transferring/Ambulation- I  Managing Meds- I   Follow up appointments reviewed:    PCP Hospital f/u appt confirmed?  Scheduled to see [name] FNP-BC on 02/11/19 @   [facility] f/u appt confirmed?  no  Are transportation arrangements needed? no  If their condition worsens, is the pt aware to call  their PCP or go to the ED? yes  Was the patient provided with contact information for the PCP's office or ED?yes  Was the pt encouraged to call back with questions or concerns? yes

## 2019-02-11 ENCOUNTER — Other Ambulatory Visit: Payer: Self-pay

## 2019-02-11 ENCOUNTER — Telehealth (INDEPENDENT_AMBULATORY_CARE_PROVIDER_SITE_OTHER): Payer: Self-pay | Admitting: Nurse Practitioner

## 2019-02-11 ENCOUNTER — Encounter: Payer: Self-pay | Admitting: Nurse Practitioner

## 2019-02-11 VITALS — BP 129/92 | HR 69 | Wt 213.0 lb

## 2019-02-11 DIAGNOSIS — E875 Hyperkalemia: Secondary | ICD-10-CM

## 2019-02-11 DIAGNOSIS — N179 Acute kidney failure, unspecified: Secondary | ICD-10-CM

## 2019-02-11 DIAGNOSIS — E1110 Type 2 diabetes mellitus with ketoacidosis without coma: Secondary | ICD-10-CM

## 2019-02-11 DIAGNOSIS — I10 Essential (primary) hypertension: Secondary | ICD-10-CM

## 2019-02-11 DIAGNOSIS — E785 Hyperlipidemia, unspecified: Secondary | ICD-10-CM

## 2019-02-11 NOTE — Progress Notes (Signed)
Virtual Visit via Video   This visit type was conducted due to national recommendations for restrictions regarding the COVID-19 Pandemic (e.g. social distancing) in an effort to limit this patient's exposure and mitigate transmission in our community.  Due to her co-morbid illnesses, this patient is at least at moderate risk for complications without adequate follow up.  This format is felt to be most appropriate for this patient at this time.  All issues noted in this document were discussed and addressed.  A limited physical exam was performed with this format.    This visit type was conducted due to national recommendations for restrictions regarding the COVID-19 Pandemic (e.g. social distancing) in an effort to limit this patient's exposure and mitigate transmission in our community.  Patients identity confirmed using two different identifiers.  This format is felt to be most appropriate for this patient at this time.  All issues noted in this document were discussed and addressed.  No physical exam was performed (except for noted visual exam findings with Video Visits).    Date:  02/14/2019   ID:  Jessica Cruz, DOB 1966-01-31, MRN [id number]  Patient Location:  Home - spoke with Jessica Cruz  Provider location:   Office    Chief Complaint:  Hospital follow up diabetes  History of Present Illness:    Jessica Cruz is a 53 y.o. female who presents via video conferencing for a telehealth visit today.    The patient does not have symptoms concerning for COVID-19 infection (fever, chills, cough, or new shortness of breath).   Hospital follow up after having vomiting and not feeling bad, and had significant weight loss in 2 months of 35 lbs. She had bad taste in her mouth, polyuria and polydipsia.  She was admitted from 10/12-10/14 for DKA, she also had hyperkalemia 5.6 and AKI, which have resolved. She was started on levemir and novolog (mealtime) Since being home her blood sugars are  170-250's.  She was given a book for diabetes educator and nutritionist.  She has medicaid pending.  She is feeling better.  She is having issues with her vision not being able to see well and was seen by opthamology and given glasses - Not back at work.  She does not have home health and does not feel this is necessary.  She is checking her blood sugars regularly now  Jessica Cruz an 53 y.o.femalewith medical history significant ofhypertension, hyperlipidemia, diabetes mellitus type 2,NSTEMI,brain aneurysms/pclipping,TIA, andmicrocytic anemia; who presents with complaints of not feeling well over the last 2 months or so. She complains of having decreased overall appetite. Notes associated symptoms of nausea, vomiting, malaise, bad taste in her mouth, urinary frequency, polydipsia, intermittent epigastric abdominal pain, shortness of breath with exertion, increased lethargy, and weight loss of approximately 35 pounds.   Hospital Course:   DKA, type II: Acute.resolved with iv fluids, iv insulin protocol. Transitioned to sq insulin regimen  DM. Uncontrolled.HgbA1c: 17. Started on insulin regimen: levemir+mealtime 6U. D/w patient, recommended to cont f/u with PCP ion 3-5 days to titrate insulin regimen as needed  Hyperkalemia:Acute. Potassium elevated at 5.6 on admission.  -resolved  Acute kidney injury: -resolved  Essential hypertension:Blood pressure currently relatively stable. Home medications appear to include hydralazine, Coreg, and losartan. -Continue hydralazine and Coreg  Hyperlipidemia -Continue atorvastatin  Nausea, vomiting, epigastric abdominal pain. Resolved. Probable due to DKA  Diabetes She presents for her follow-up diabetic visit. She has type 2 diabetes mellitus. Pertinent negatives for hypoglycemia include  no dizziness. Associated symptoms include blurred vision. Pertinent negatives for diabetes include no polydipsia.     Past Medical  History:  Diagnosis Date  . Anxiety   . Brain aneurysm    a. h/o intracranial aneurysm rupture (clipped [facility] 2007).  . Essential hypertension   . Fibroids   . Hyperlipidemia   . Hypertensive heart disease   . Liver nodule   . Menorrhagia   . Microcytic anemia   . Non-ST elevation myocardial infarction (NSTEMI), type 2    a. demand ischemia 01/2016 in setting of accelerated HTN, anemia - normal cors by cath.  . Obesity   . Pre-diabetes   . Stroke [facility])    Past Surgical History:  Procedure Laterality Date  . CARDIAC CATHETERIZATION N/A 02/16/2016   Procedure: Left Heart Cath and Coronary Angiography;  Surgeon: Wellington Hampshire, MD;  Location: [facility];  Service: Cardiovascular;  Laterality: N/A;  . CEREBRAL ANEURYSM REPAIR       Current Meds  Medication Sig  . aspirin 325 MG tablet Take 1 tablet (325 mg total) by mouth daily.  [name] Kitchen atorvastatin (LIPITOR) 40 MG tablet Take 1 tablet (40 mg total) by mouth daily.  . blood glucose meter kit and supplies KIT Dispense based on patient and insurance preference. Use up to four times daily as directed. (FOR ICD-9 250.00, 250.01).  . carvedilol (COREG) 12.5 MG tablet Take 1 tablet (12.5 mg total) by mouth 2 (two) times daily with a meal.  . gabapentin (NEURONTIN) 100 MG capsule Take 2 capsules (200 mg total) by mouth at bedtime.  . hydrALAZINE (APRESOLINE) 50 MG tablet Take 1 tablet (50 mg total) by mouth every 8 (eight) hours.  . insulin aspart (NOVOLOG) 100 UNIT/ML injection Inject 6 Units into the skin 3 (three) times daily with meals.  . insulin detemir (LEVEMIR) 100 UNIT/ML injection Inject 0.25 mLs (25 Units total) into the skin daily.  . Insulin Syringe-Needle U-100 (INSULIN SYRINGE .5CC/30GX1/2") 30G X 1/2" 0.5 ML MISC As needed  . losartan (COZAAR) 50 MG tablet Take 1 tablet (50 mg total) by mouth daily.  . pantoprazole (PROTONIX) 40 MG tablet Take 1 tablet (40 mg total) by mouth daily.     Allergies:   Pineapple and  Shellfish allergy   Social History   Tobacco Use  . Smoking status: Never Smoker  . Smokeless tobacco: Never Used  Substance Use Topics  . Alcohol use: Yes    Comment: rare  . Drug use: No     Family Hx: The patient's family history includes Diabetes in her mother; Heart attack in her paternal grandfather and paternal grandmother; Heart attack (age of onset: 67) in her father; Heart disease in her maternal grandmother, paternal grandfather, and paternal grandmother; Heart failure in her paternal grandfather and paternal grandmother; Hypertension in her mother; Stroke in her father, maternal grandfather, and paternal grandfather.  ROS:   Please see the history of present illness.    Review of Systems  Constitutional: Negative.   Eyes: Positive for blurred vision.  Cardiovascular: Negative.   Genitourinary: Negative for frequency.  Neurological: Negative for dizziness and tingling.  Endo/Heme/Allergies: Negative for polydipsia.  Psychiatric/Behavioral: Negative.     All other systems reviewed and are negative.   Labs/Other Tests and Data Reviewed:    Recent Labs: 01/27/2019: ALT 24; Magnesium 1.9; TSH 3.407 01/28/2019: BUN 9; Creatinine, Ser 0.78; Hemoglobin 11.9; Platelets 229; Potassium 4.3; Sodium 131   Recent Lipid Panel Lab Results  Component Value  Date/Time   CHOL 238 (H) 09/12/2018 12:02 PM   TRIG 311 (H) 09/12/2018 12:02 PM   HDL 41 09/12/2018 12:02 PM   CHOLHDL 5.8 (H) 09/12/2018 12:02 PM   CHOLHDL 7.1 10/08/2016 04:03 AM   LDLCALC 135 (H) 09/12/2018 12:02 PM    Wt Readings from Last 3 Encounters:  02/11/19 213 lb (96.6 kg)  01/27/19 213 lb (96.6 kg)  09/12/18 246 lb 6.4 oz (111.8 kg)     Exam:    Vital Signs:  BP (!) 129/92 (BP Location: Left Arm, Patient Position: Sitting, Cuff Size: Large)   Pulse 69   Wt 213 lb (96.6 kg)   BMI 34.38 kg/m     Physical Exam  Constitutional: She is oriented to person, place, and time and well-developed,  well-nourished, and in no distress. No distress.  Pulmonary/Chest: Effort normal. No respiratory distress.  Neurological: She is alert and oriented to person, place, and time.  Psychiatric: Mood, memory, affect and judgment normal.    ASSESSMENT & PLAN:    1. Diabetic ketoacidosis without coma associated with type 2 diabetes mellitus ([place])  Admitted to hospital 10/12-10/14 with a HgbA1c of 17.7, she is now on levemir and novolog. She states she feels better. She is to continue at current dose. I would love to get her on a GLP1 however she does not have any insurance so will reach out to CCM to see if able to help with any resources and patient assistance programs.  I am also having the diabetic educator to call TCM Performed. A member of the clinical team spoke with the patient upon dischare. Discharge summary was reviewed in full detail during the visit. Meds reconciled and compared to discharge meds. Medication list is updated and reviewed with the patient.  Greater than 50% face to face time was spent in counseling an coordination of care.  All questions were answered to the satisfaction of the patient.  I have stressed to her once again the importance of following up every 3 months or as needed as it relates to her diabetes. I have also discussed the risk of heart disease to include strokes and heart attacks related to diabetes, kidney failure, vision loss and death.    2. AKI (acute kidney injury) ([place])  Likely related to the DKA  Resolved while hospitalized  Will recheck in 8 weeks  3. Hyperkalemia  Potassium was up to 5.6  resolved   COVID-19 Education: The signs and symptoms of COVID-19 were discussed with the patient and how to seek care for testing (follow up with PCP or arrange E-visit).  The importance of social distancing was discussed today.  Patient Risk:   After full review of this patients clinical status, I feel that they are at least moderate risk at this time.   Time:   Today, I have spent 25 minutes/ seconds with the patient with telehealth technology discussing above diagnoses.     Medication Adjustments/Labs and Tests Ordered: Current medicines are reviewed at length with the patient today.  Concerns regarding medicines are outlined above.   Tests Ordered: No orders of the defined types were placed in this encounter.   Medication Changes: No orders of the defined types were placed in this encounter.   Disposition:  Follow up in 8 week(s)  Signed, [name], FNP

## 2019-02-12 ENCOUNTER — Telehealth: Payer: Self-pay

## 2019-02-12 ENCOUNTER — Ambulatory Visit: Payer: Self-pay

## 2019-02-12 DIAGNOSIS — E1165 Type 2 diabetes mellitus with hyperglycemia: Secondary | ICD-10-CM

## 2019-02-12 NOTE — Chronic Care Management (AMB) (Signed)
  Care Management   Outreach Note  02/12/2019 Name: Jessica Cruz MRN: [id number] DOB: Sep 03, 1965  Referred by: [name] FNP Reason for referral : Care Coordination   An unsuccessful telephone outreach was attempted today. The patient was referred to the case management team by for assistance with care management and care coordination.   Follow Up Plan: A HIPPA compliant phone message was left for the patient providing contact information and requesting a return call.  The care management team will reach out to the patient again over the next 10 days.   [name], BSW, CDP Social Worker, Certified Dementia Practitioner [place] [facility] [place] [facility] [phone]

## 2019-02-13 ENCOUNTER — Ambulatory Visit: Payer: Self-pay

## 2019-02-13 DIAGNOSIS — I119 Hypertensive heart disease without heart failure: Secondary | ICD-10-CM

## 2019-02-13 DIAGNOSIS — E1165 Type 2 diabetes mellitus with hyperglycemia: Secondary | ICD-10-CM

## 2019-02-14 ENCOUNTER — Encounter: Payer: Self-pay | Admitting: Nurse Practitioner

## 2019-02-14 ENCOUNTER — Ambulatory Visit: Payer: Self-pay

## 2019-02-14 DIAGNOSIS — E1165 Type 2 diabetes mellitus with hyperglycemia: Secondary | ICD-10-CM

## 2019-02-14 DIAGNOSIS — E1110 Type 2 diabetes mellitus with ketoacidosis without coma: Secondary | ICD-10-CM

## 2019-02-14 NOTE — Patient Instructions (Addendum)
Visit Information  Goals Addressed      Patient Stated   . 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  . Non-adherence to prescribed medication regimen . Difficulty obtaining medications . Chronic Disease Management support and education needs related to improved management of DM  Nurse Case Manager Clinical Goal(s):  [name] Kitchen Over the next 90 days, patient will work with the CCM team and PCP to address needs related to chronic disease management of DM . Over the next 90 days, patient will demonstrate a decrease in hyperglycemic exacerbations as evidenced by patient will log her FBS and will not experience more than 2 abnormally high readings >250 . Over the next 120 days, patient will demonstrate improved adherence to prescribed treatment plan for Self Health management of DM as evidenced bypatient will adhere to checking FBS 1-2 times daily, she will adhere to taking prescribed medications exactly as directed and will report 100% adherence to following a Diabetic friendly diet  CCM RN CM Interventions:  02/13/19 call completed with patient  . Evaluation of current treatment plan related to Diabetes and patient's adherence to plan as established by provider. . Provided education to patient re: current A1C of 17.0%; discussed target A1C is <7.0%; educated patient on ways to lower A1C by following a modified low carb Diabetic diet using the Plate Method with portion control; discussed the importance of taking all diabetic medications exactly as prescribed w/o missed doses; educated on the importance of implementing exercise into daily routine . Discussed plans with patient for ongoing care management follow up and provided patient with direct contact information for care management team . Provided patient with printed educational materials related to  Diabetes Management, Diabetes Zone Safety Tool, Carb Counting, Carb Choices, s/s hypo/hyperglycemia, DM BS log . Advised patient, providing education and rationale, to check cbg daily before meals and record, calling the CCM team and or PCP for findings outside established parameters.    Patient Self Care Activities:  . Self administers medications as prescribed . Attends all scheduled provider appointments . Calls pharmacy for medication refills . Performs ADL's independently . Performs IADL's independently . Calls provider office for new concerns or questions  Initial goal documentation     . "My vision has been blurry" (pt-stated)       Current Barriers:  [name] Kitchen Knowledge Deficits related to high risk for diabetic retinopathy and or other visual impairment secondary to hyperglycemia/uncontrolled DM  Nurse Case Manager Clinical Goal(s):  [name] Kitchen Over the next 90 days, patient will work with the CCM team to address needs related to [place] management of DM to help reduce the risk for retinal complications . Over the next 90 days, patient will experience improved visual acuity as evidence by patient will report the "blurry" vision has resolved.  . Over the next 90 days, patient will lower her A1C <10.0%  CCM RN CM Interventions:  02/13/19 call completed with patient   . Evaluation of current treatment plan related to blurred vision and decreased visual acuity and patient's adherence to plan as established by provider. . Advised patient to report new or worsening symptoms related to blurred vision promptly to the CCM team and or PCP . Provided education to patient re: patient is at high risk for developing diabetic retinopathy and or blindness secondary to having uncontrolled DM with hyperglycemia; provided  education related to this disease process and treatment management; discussed patient has f/u with an eye specialist since being d/c home from the hospital; discussed the patient was advised  her lens have been effected from the hyperglycemia and should resolved once her DM is under better control . Discussed plans with patient for ongoing care management follow up and provided patient with direct contact information for care management team . Provided patient with printed educational materials related to Diabetic Retinopathy  Patient Self Care Activities:  . Self administers medications as prescribed . Attends all scheduled provider appointments . Calls pharmacy for medication refills . Performs ADL's independently . Performs IADL's independently . Calls provider office for new concerns or questions  Initial goal documentation        The patient verbalized understanding of instructions provided today and declined a print copy of patient instruction materials.   Telephone follow up appointment with care management team member scheduled for: 03/25/19  Barb Merino, RN, BSN, CCM Care Management Coordinator [place] [facility]  Direct Phone: [phone]

## 2019-02-14 NOTE — Chronic Care Management (AMB) (Signed)
  Chronic Care Management   Outreach Note  02/14/2019 Name: [name] MRN: [id number] DOB: 06-30-1965  Referred by: Glendale Chard, MD Reason for referral : Care Coordination   A second unsuccessful telephone outreach was attempted today. The patient was referred to the case management team for assistance with care management and care coordination.   Follow Up Plan: A HIPPA compliant phone message was left for the patient providing contact information and requesting a return call.  The care management team will reach out to the patient again over the next 10 days.   Jessica Cruz, BSW, CDP Social Worker, Certified Dementia Practitioner [place] [facility] [place] [facility] [phone]

## 2019-02-14 NOTE — Chronic Care Management (AMB) (Addendum)
Care Management   Initial Visit Note  02/13/2019 Name: Jessica Cruz MRN: [id number] DOB: [birth year]  Referred by: Glendale Chard, MD Reason for referral : Care Coordination ([place] patient outreach )   Jessica Cruz is a 53 y.o. year old female who is a primary care patient of Glendale Chard, MD. The CCM team was consulted for assistance with chronic disease management and care coordination needs related to DMII  Review of patient status, including review of consultants reports, relevant laboratory and other test results, and collaboration with appropriate care team members and the patient's provider was performed as part of comprehensive patient evaluation and provision of chronic care management services.    SDOH (Social Determinants of Health) screening performed today: Financial Strain . See Care Plan for related entries. (Embedded Pharm D [name] to address medication needs as requested by PCP provider)  Advanced Directives Status: [place] [employer] [place] application for related entries.   I spoke with Jessica Cruz by telephone today to follow up on her Diabetes and recent IP admission for DKA.   Medications: Outpatient Encounter Medications as of 02/13/2019  Medication Sig  . acetaminophen (TYLENOL) 650 MG CR tablet Take 650 mg by mouth 2 (two) times daily as needed for pain.  [name] Kitchen aspirin 325 MG tablet Take 1 tablet (325 mg total) by mouth daily.  [name] Kitchen atorvastatin (LIPITOR) 40 MG tablet Take 1 tablet (40 mg total) by mouth daily.  . blood glucose meter kit and supplies KIT Dispense based on patient and insurance preference. Use up to four times daily as directed. (FOR ICD-9 250.00, 250.01).  . carvedilol (COREG) 12.5 MG tablet Take 1 tablet (12.5 mg total) by mouth 2 (two) times daily with a meal.  . gabapentin (NEURONTIN) 100 MG capsule Take 2 capsules (200 mg total) by mouth at bedtime.  . hydrALAZINE (APRESOLINE) 50 MG tablet Take 1 tablet (50 mg total) by mouth every 8 (eight)  hours.  . insulin aspart (NOVOLOG) 100 UNIT/ML injection Inject 6 Units into the skin 3 (three) times daily with meals.  . insulin detemir (LEVEMIR) 100 UNIT/ML injection Inject 0.25 mLs (25 Units total) into the skin daily.  . Insulin Syringe-Needle U-100 (INSULIN SYRINGE .5CC/30GX1/2") 30G X 1/2" 0.5 ML MISC As needed  . losartan (COZAAR) 50 MG tablet Take 1 tablet (50 mg total) by mouth daily.  . pantoprazole (PROTONIX) 40 MG tablet Take 1 tablet (40 mg total) by mouth daily.   No facility-administered encounter medications on file as of 02/13/2019.      Objective:  Lab Results  Component Value Date   HGBA1C 17.3 (H) 01/28/2019   HGBA1C 8.0 (H) 09/12/2018   HGBA1C 8.0 (H) 04/05/2018   Lab Results  Component Value Date   LDLCALC 135 (H) 09/12/2018   CREATININE 0.78 01/28/2019   BP Readings from Last 3 Encounters:  02/11/19 (!) 129/92  01/29/19 (!) 136/109  09/12/18 126/80   Goals Addressed      Patient Stated   . 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  . Non-adherence to prescribed medication regimen . Difficulty obtaining medications . Chronic Disease Management support and education needs related to improved management of DM  Nurse Case Manager Clinical Goal(s):  [name] Kitchen Over the next 90 days, patient will work with the CCM team and PCP to address  needs related to chronic disease management of DM . Over the next 90 days, patient will demonstrate a decrease in hyperglycemic exacerbations as evidenced by patient will log her FBS and will not experience more than 2 abnormally high readings >250 . Over the next 120 days, patient will demonstrate improved adherence to prescribed treatment plan for Self Health management of DM as evidenced bypatient will adhere to checking FBS 1-2 times daily, she will adhere to taking prescribed  medications exactly as directed and will report 100% adherence to following a Diabetic friendly diet  CCM RN CM Interventions:  02/12/19 call completed with patient  . Evaluation of current treatment plan related to Diabetes and patient's adherence to plan as established by provider. . Provided education to patient re: current A1C of 17.0%; discussed target A1C is <7.0%; educated patient on ways to lower A1C by following a modified low carb Diabetic diet using the Plate Method with portion control; discussed the importance of taking all diabetic medications exactly as prescribed w/o missed doses; educated on the importance of implementing exercise into daily routine . Discussed plans with patient for ongoing care management follow up and provided patient with direct contact information for care management team . Provided patient with printed educational materials related to Diabetes Management, Diabetes Zone Safety Tool, Carb Counting, Carb Choices, s/s hypo/hyperglycemia, DM BS log . Advised patient, providing education and rationale, to check cbg daily before meals and record, calling the CCM team and or PCP for findings outside established parameters.    Patient Self Care Activities:  . Self administers medications as prescribed . Attends all scheduled provider appointments . Calls pharmacy for medication refills . Performs ADL's independently . Performs IADL's independently . Calls provider office for new concerns or questions  Initial goal documentation     . "My vision has been blurry" (pt-stated)       Current Barriers:  [name] Kitchen Knowledge Deficits related to high risk for diabetic retinopathy and or other visual impairment secondary to hyperglycemia/uncontrolled DM  Nurse Case Manager Clinical Goal(s):  [name] Kitchen Over the next 90 days, patient will work with the CCM team to address needs related to [place] management of DM to help reduce the risk for retinal complications . Over the next 90  days, patient will experience improved visual acuity as evidence by patient will report the "blurry" vision has resolved.  . Over the next 90 days, patient will lower her A1C <10.0%  CCM RN CM Interventions:  02/12/19 call completed with patient   . Evaluation of current treatment plan related to blurred vision and decreased visual acuity and patient's adherence to plan as established by provider. . Advised patient to report new or worsening symptoms related to blurred vision promptly to the CCM team and or PCP . Provided education to patient re: patient is at high risk for developing diabetic retinopathy and or blindness secondary to having uncontrolled DM with hyperglycemia; provided education related to this disease process and treatment management; discussed patient has f/u with an eye specialist since being d/c home from the hospital; discussed the patient was advised her lens have been effected from the hyperglycemia and should resolved once her DM is under better control . Discussed plans with patient for ongoing care management follow up and provided patient with direct contact information for care management team . Provided patient with printed educational materials related to Diabetic Retinopathy  Patient Self Care Activities:  . Self administers medications as prescribed . Attends all scheduled provider  appointments . Calls pharmacy for medication refills . Performs ADL's independently . Performs IADL's independently . Calls provider office for new concerns or questions  Initial goal documentation      Jessica Cruz was given information about Chronic Care Management services today including:  1. CCM service includes personalized support from designated clinical staff supervised by her physician, including individualized plan of care and coordination with other care providers 2. 24/7 contact phone numbers for assistance for urgent and routine care needs. 3. The patient may stop CCM  services at any time (effective at the end of the month) by phone call to the office staff.  Patient agreed to services and verbal consent obtained.    Plan:    Telephone follow up appointment with care management team member scheduled for: 03/25/19  Barb Merino, RN, BSN, CCM Care Management Coordinator [place] [facility]  Direct Phone: [phone]

## 2019-02-17 ENCOUNTER — Ambulatory Visit: Payer: Self-pay

## 2019-02-17 DIAGNOSIS — E1165 Type 2 diabetes mellitus with hyperglycemia: Secondary | ICD-10-CM

## 2019-02-17 NOTE — Chronic Care Management (AMB) (Signed)
Care Management    Social Work General Note  02/17/2019 Name: Jessica Cruz MRN: [id number] DOB: [birth year]  Jessica Cruz is a 53 y.o. year old female who is a primary care patient of [name], [place]. The Care Management team was consulted to assist the patient with disease management and care coordination.   Review of patient status, including review of consultants reports, relevant laboratory and other test results, and collaboration with appropriate care team members and the patient's provider was performed as part of comprehensive patient evaluation and provision of chronic care management services.    SDOH (Social Determinants of Health) screening performed today. See Care Plan Entry related to challenges with: Financial Strain  Food Insecurity   Advanced Directives Status: None but interested in receiving education. See Care Plan for related entries.   Outpatient Encounter Medications as of 02/17/2019  Medication Sig  . acetaminophen (TYLENOL) 650 MG CR tablet Take 650 mg by mouth 2 (two) times daily as needed for pain.  [name] Kitchen aspirin 325 MG tablet Take 1 tablet (325 mg total) by mouth daily.  [name] Kitchen atorvastatin (LIPITOR) 40 MG tablet Take 1 tablet (40 mg total) by mouth daily.  . blood glucose meter kit and supplies KIT Dispense based on patient and insurance preference. Use up to four times daily as directed. (FOR ICD-9 250.00, 250.01).  . carvedilol (COREG) 12.5 MG tablet Take 1 tablet (12.5 mg total) by mouth 2 (two) times daily with a meal.  . gabapentin (NEURONTIN) 100 MG capsule Take 2 capsules (200 mg total) by mouth at bedtime.  . hydrALAZINE (APRESOLINE) 50 MG tablet Take 1 tablet (50 mg total) by mouth every 8 (eight) hours.  . insulin aspart (NOVOLOG) 100 UNIT/ML injection Inject 6 Units into the skin 3 (three) times daily with meals.  . insulin detemir (LEVEMIR) 100 UNIT/ML injection Inject 0.25 mLs (25 Units total) into the skin daily.  . Insulin Syringe-Needle U-100 (INSULIN  SYRINGE .5CC/30GX1/2") 30G X 1/2" 0.5 ML MISC As needed  . losartan (COZAAR) 50 MG tablet Take 1 tablet (50 mg total) by mouth daily.  . pantoprazole (PROTONIX) 40 MG tablet Take 1 tablet (40 mg total) by mouth daily.   No facility-administered encounter medications on file as of 02/17/2019.     Goals Addressed            This Visit's Progress     Patient Stated   . "I am not working and can't afford food" (pt-stated)       Current Barriers:  . Out of work for several months due to [place] 19 Pandemic . Uncontrolled Diabetes resulting in blurred vision . Lacks knowledge of community resources to assist with food insecurity  Social Work Clinical Goal(s):  [name] Kitchen Over the next 45 days, patient will follow up with Food and Nutrition Services as directed by SW  CCM SW Interventions: Completed 02/17/2019 . Patient interviewed and appropriate assessments performed o Patient reports she has been out of work for several months without ability to afford food at this time o Patient relies on friends to assist with nutrition needs at this time o Patient reports limited appetite since recent inpatient stay  . Provided patient with information about community resources including local food pantry sites as well as opportunity to apply for assistance through [employer] and Nutrition Services . Determined the patient is interested in Food and Nutrition programs . Mailed the patient an application to apply for assistance . Assessed for patients ability to complete  application due to impacted vision o Patient endorses social support whom can assist with application completion . Scheduled follow up call to the patient over the next month to confirm receipt of resource and assist with completion as needed . Advised the patient to contact SW directly if needs arise prior to the next scheduled call  Patient Self Care Activities:  . Attends all scheduled provider appointments . Performs ADL's independently .  Calls provider office for new concerns or questions  Initial goal documentation     . "I don't have any income" (pt-stated)       Current Barriers:  . Out of work due to [employer] 19 Pandemic . Limited ability to work due to impaired vision as a result of poor diabetes management . Lacks knowledge of ability to apply for disability  Social Work Clinical Goal(s):  [name] Kitchen Over the next 90 days, patient will work with SW to address concerns related to financial insecurity  CCM SW Interventions: Completed 02/17/2019 . Patient interviewed and appropriate assessments performed . Discussed opportunity to apply for disability income pending the patients ability to return to work . Determined the patient would work with RN Case Manager in an attempt to better manage diabetes over the next month prior to exploring options for returning to work versus applying for disability  Patient Self Care Activities:  . Does not adhere to provider recommendations re: diabetes management . Does not adhere to prescribed medication regimen . Unable to perform IADLs independently  Initial goal documentation     . "I would like to learn more about advance directives" (pt-stated)       Current Barriers:  . Limited education about the importance of naming a healthcare power of attorney   Social Work Clinical Goal(s):  [name] Kitchen Over the next 20 days, the patient will review mailed Advance Directive packet . Over the next 30 days, patient will verbalize basic understanding of Advanced Directives and importance of completion  CCM SW Interventions: Completed 02/17/2019 . Interviewed patient about Financial controller and provided education about the importance of completing advanced directives . Mailed the patient an Emergency planning/management officer . Advised patient to review information mailed by this SW  Patient Self Care Activities:  . Is able to readily make contact with social support system . Calls provider office for new  concerns or questions . Performs ADLs independently  Initial goal documentation          Follow Up Plan: SW will follow up with patient by phone over the next month. SW advised the patient to contact SW if needs arise prior to the next scheduled call.       [name], BSW, CDP Social Worker, Certified Dementia Practitioner [place] [facility] [place] [facility] [phone]

## 2019-02-17 NOTE — Patient Instructions (Signed)
Social Worker Visit Information  Goals we discussed today:  Goals Addressed            This Visit's Progress     Patient Stated   . "I am not working and can't afford food" (pt-stated)       Current Barriers:  . Out of work for several months due to [place] 19 Pandemic . Uncontrolled Diabetes resulting in blurred vision . Lacks knowledge of community resources to assist with food insecurity  Social Work Clinical Goal(s):  [name] Kitchen Over the next 45 days, patient will follow up with Food and Nutrition Services as directed by SW  CCM SW Interventions: Completed 02/17/2019 . Patient interviewed and appropriate assessments performed o Patient reports she has been out of work for several months without ability to afford food at this time o Patient relies on friends to assist with nutrition needs at this time o Patient reports limited appetite since recent inpatient stay  . Provided patient with information about community resources including local food pantry sites as well as opportunity to apply for assistance through [employer] and Nutrition Services . Determined the patient is interested in Food and Nutrition programs . Mailed the patient an application to apply for assistance . Assessed for patients ability to complete application due to impacted vision o Patient endorses social support whom can assist with application completion . Scheduled follow up call to the patient over the next month to confirm receipt of resource and assist with completion as needed . Advised the patient to contact SW directly if needs arise prior to the next scheduled call  Patient Self Care Activities:  . Attends all scheduled provider appointments . Performs ADL's independently . Calls provider office for new concerns or questions  Initial goal documentation     . "I don't have any income" (pt-stated)       Current Barriers:  . Out of work due to [employer] Pandemic . Limited ability to work due to impaired vision  as a result of poor diabetes management . Lacks knowledge of ability to apply for disability  Social Work Clinical Goal(s):  [name] Kitchen Over the next 90 days, patient will work with SW to address concerns related to financial insecurity  CCM SW Interventions: Completed 02/17/2019 . Patient interviewed and appropriate assessments performed . Discussed opportunity to apply for disability income pending the patients ability to return to work . Determined the patient would work with RN Case Manager in an attempt to better manage diabetes over the next month prior to exploring options for returning to work versus applying for disability  Patient Self Care Activities:  . Does not adhere to provider recommendations re: diabetes management . Does not adhere to prescribed medication regimen . Unable to perform IADLs independently  Initial goal documentation     . "I would like to learn more about advance directives" (pt-stated)       Current Barriers:  . Limited education about the importance of naming a healthcare power of attorney   Social Work Clinical Goal(s):  [name] Kitchen Over the next 20 days, the patient will review mailed Advance Directive packet . Over the next 30 days, patient will verbalize basic understanding of Advanced Directives and importance of completion  CCM SW Interventions: Completed 02/17/2019 . Interviewed patient about Financial controller and provided education about the importance of completing advanced directives . Mailed the patient an Emergency planning/management officer . Advised patient to review information mailed by this SW  Patient Self Care Activities:  .  Is able to readily make contact with social support system . Calls provider office for new concerns or questions . Performs ADLs independently  Initial goal documentation          Materials Provided: Yes: Mailed Food and Nutrition application as well as Emergency planning/management officer.  Follow Up Plan: SW will follow up with patient  by phone over the next month.   [name], BSW, CDP Social Worker, Certified Dementia Practitioner [place] [facility] [place] [facility] [phone]

## 2019-02-18 ENCOUNTER — Inpatient Hospital Stay: Payer: Self-pay | Admitting: Critical Care Medicine

## 2019-02-19 ENCOUNTER — Ambulatory Visit: Payer: Self-pay | Admitting: Pharmacist

## 2019-02-21 ENCOUNTER — Telehealth: Payer: Self-pay

## 2019-02-21 NOTE — Progress Notes (Signed)
Chronic Care Management   Visit Note  02/19/2019 Name: Jessica Cruz MRN: [id number] DOB: [birth year]  Referred by: [name], DNP, FNP Reason for referral : Chronic Care Management   Jessica Cruz is a 53 y.o. year old female who is a primary care patient of Glendale Chard, MD. The CCM team was consulted for assistance with chronic disease management and care coordination needs related to DMII  Review of patient status, including review of consultants reports, relevant laboratory and other test results, and collaboration with appropriate care team members and the patient's provider was performed as part of comprehensive patient evaluation and provision of chronic care management services.    I spoke with Ms. [place] by telephone today.   Medications: Outpatient Encounter Medications as of 02/19/2019  Medication Sig  . acetaminophen (TYLENOL) 650 MG CR tablet Take 650 mg by mouth 2 (two) times daily as needed for pain.  [name] Kitchen aspirin 325 MG tablet Take 1 tablet (325 mg total) by mouth daily.  [name] Kitchen atorvastatin (LIPITOR) 40 MG tablet Take 1 tablet (40 mg total) by mouth daily.  . blood glucose meter kit and supplies KIT Dispense based on patient and insurance preference. Use up to four times daily as directed. (FOR ICD-9 250.00, 250.01).  . carvedilol (COREG) 12.5 MG tablet Take 1 tablet (12.5 mg total) by mouth 2 (two) times daily with a meal.  . gabapentin (NEURONTIN) 100 MG capsule Take 2 capsules (200 mg total) by mouth at bedtime.  . hydrALAZINE (APRESOLINE) 50 MG tablet Take 1 tablet (50 mg total) by mouth every 8 (eight) hours.  . insulin aspart (NOVOLOG) 100 UNIT/ML injection Inject 6 Units into the skin 3 (three) times daily with meals.  . insulin detemir (LEVEMIR) 100 UNIT/ML injection Inject 0.25 mLs (25 Units total) into the skin daily.  . Insulin Syringe-Needle U-100 (INSULIN SYRINGE .5CC/30GX1/2") 30G X 1/2" 0.5 ML MISC As needed  . losartan (COZAAR) 50 MG tablet Take 1 tablet (50 mg  total) by mouth daily.  . pantoprazole (PROTONIX) 40 MG tablet Take 1 tablet (40 mg total) by mouth daily.   No facility-administered encounter medications on file as of 02/19/2019.      Objective:   Goals Addressed            This Visit's Progress     Patient Stated   . COMPLETED: "I can't afford to buy my medicine" (pt-stated)       Current Barriers:  . Financial constraints  . No insurance . A1c 16%  Clinical CCM PharmD Clinical Goal(s):  [name] Kitchen Over the next 30 days, patient will work with embedded Pharm D to address needs related to medication assistance  Interventions: Call completed with patient on 02/19/2019 . Patient interviewed and appropriate assessments performed . Discussed plans with patient for ongoing care management follow up and provided patient with direct contact information for care management team  . Collaboration with embedded Pharm D [name] regarding barriers for patient to obtain medications . 02/19/19-->Will attempt [place] patient assistance application for Basaglar/Humalog--basal bolus regimen.  Patient and PCP in agreement. Will continue current dosing of basal 25 units nightly and 6 units of mealtime coverage.  Patient's FBG 140-160. [name] Kitchen Last A1c 16% and pt was admitted for DKA . Samples of basal/bolus left for patient to pick up at PCP office . All medications refilled and called into local [facility] for affordability and patient preference. . Will continue to follow   Patient Self Care Activities:  .  Performs ADL's independently . Calls provider office for new concerns or questions  Please see past updates related to this goal by clicking on the "Past Updates" button in the selected goal         Plan:   The care management team will reach out to the patient again over the next 30 days.   Provider Signature Regina Eck, PharmD, BCPS Clinical Pharmacist, [facility] [place]: [phone]

## 2019-02-21 NOTE — Patient Instructions (Signed)
Visit Information  Goals Addressed            This Visit's Progress     Patient Stated   . COMPLETED: "I can't afford to buy my medicine" (pt-stated)       Current Barriers:  . Financial constraints  . No insurance . A1c 16%  Clinical CCM PharmD Clinical Goal(s):  [name] Kitchen Over the next 30 days, patient will work with embedded Pharm D to address needs related to medication assistance  Interventions: Call completed with patient on 02/19/2019 . Patient interviewed and appropriate assessments performed . Discussed plans with patient for ongoing care management follow up and provided patient with direct contact information for care management team  . Collaboration with embedded Pharm D [name] regarding barriers for patient to obtain medications . 02/19/19-->Will attempt [place] patient assistance application for Basaglar/Humalog--basal bolus regimen.  Patient and PCP in agreement. Will continue current dosing of basal 25 units nightly and 6 units of mealtime coverage.  Patient's FBG 140-160. [name] Kitchen Last A1c 16% and pt was admitted for DKA . Samples of basal/bolus left for patient to pick up at PCP office . All medications refilled and called into local [facility] for affordability and patient preference. . Will continue to follow   Patient Self Care Activities:  . Performs ADL's independently . Calls provider office for new concerns or questions  Please see past updates related to this goal by clicking on the "Past Updates" button in the selected goal         The patient verbalized understanding of instructions provided today and declined a print copy of patient instruction materials.   The care management team will reach out to the patient again over the next 30 days.   SIGNATURE Regina Eck, PharmD, BCPS Clinical Pharmacist, [facility] [place]: [phone]

## 2019-02-28 ENCOUNTER — Telehealth: Payer: Self-pay

## 2019-03-03 ENCOUNTER — Ambulatory Visit: Payer: Self-pay | Admitting: Pharmacist

## 2019-03-03 DIAGNOSIS — N179 Acute kidney failure, unspecified: Secondary | ICD-10-CM

## 2019-03-03 DIAGNOSIS — E119 Type 2 diabetes mellitus without complications: Secondary | ICD-10-CM

## 2019-03-03 NOTE — Progress Notes (Signed)
Chronic Care Management   Visit Note  03/03/2019 Name: Jessica Cruz MRN: [id number] DOB: 1965-09-10  Referred by: Glendale Chard, MD Reason for referral : Chronic Care Management   Jessica Cruz is a 53 y.o. year old female who is a primary care patient of Glendale Chard, MD. The CCM team was consulted for assistance with chronic disease management and care coordination needs related to DMII  Review of patient status, including review of consultants reports, relevant laboratory and other test results, and collaboration with appropriate care team members and the patient's provider was performed as part of comprehensive patient evaluation and provision of chronic care management services.    I met with Ms. Clipper today in clinic  Medications: Outpatient Encounter Medications as of 03/03/2019  Medication Sig  . acetaminophen (TYLENOL) 650 MG CR tablet Take 650 mg by mouth 2 (two) times daily as needed for pain.  [name] Kitchen aspirin 325 MG tablet Take 1 tablet (325 mg total) by mouth daily.  [name] Kitchen atorvastatin (LIPITOR) 40 MG tablet Take 1 tablet (40 mg total) by mouth daily.  . blood glucose meter kit and supplies KIT Dispense based on patient and insurance preference. Use up to four times daily as directed. (FOR ICD-9 250.00, 250.01).  . carvedilol (COREG) 12.5 MG tablet Take 1 tablet (12.5 mg total) by mouth 2 (two) times daily with a meal.  . gabapentin (NEURONTIN) 100 MG capsule Take 2 capsules (200 mg total) by mouth at bedtime.  . hydrALAZINE (APRESOLINE) 50 MG tablet Take 1 tablet (50 mg total) by mouth every 8 (eight) hours.  . insulin aspart (NOVOLOG) 100 UNIT/ML injection Inject 6 Units into the skin 3 (three) times daily with meals.  . insulin detemir (LEVEMIR) 100 UNIT/ML injection Inject 0.25 mLs (25 Units total) into the skin daily.  . Insulin Syringe-Needle U-100 (INSULIN SYRINGE .5CC/30GX1/2") 30G X 1/2" 0.5 ML MISC As needed  . losartan (COZAAR) 50 MG tablet Take 1 tablet (50 mg total) by  mouth daily.  . pantoprazole (PROTONIX) 40 MG tablet Take 1 tablet (40 mg total) by mouth daily.   No facility-administered encounter medications on file as of 03/03/2019.      Objective:   Goals Addressed            This Visit's Progress     Patient Stated   . "I can't afford to buy my medicine" (pt-stated)       Current Barriers:  . Financial constraints  . No insurance . A1c 16%  Clinical CCM PharmD Clinical Goal(s):  [name] Kitchen Over the next 30 days, patient will work with embedded Pharm D to address needs related to medication assistance  Interventions: Visit completed with patient on 03/03/2019 . Patient interviewed and appropriate assessments performed . Discussed plans with patient for ongoing care management follow up and provided patient with direct contact information for care management team  . Collaboration with embedded Pharm D [name] regarding barriers for patient to obtain medications . 02/19/19-->Will attempt [place] patient assistance application for Basaglar/Humalog--basal bolus regimen.  Patient and PCP in agreement. Will continue current dosing of basal 25 units nightly and 6 units of mealtime coverage.  Patient's FBG 140-160. [name] Kitchen Samples given on 03/03/2019 for Levemir and Humalog vials.  Met with patient to discuss instructions. . Last A1c 16% and pt was admitted for DKA . Samples of basal/bolus left for patient to pick up at PCP office . All medications refilled and called into local [facility] for affordability and patient  preference. . Will continue to follow   Patient Self Care Activities:  . Performs ADL's independently . Calls provider office for new concerns or questions  Please see past updates related to this goal by clicking on the "Past Updates" button in the selected goal        Plan:   The care management team will reach out to the patient again over the next 6-8 weeks   Provider Signature Regina Eck, PharmD, [place]  Pharmacist, [facility] [place]: [phone]

## 2019-03-10 ENCOUNTER — Telehealth: Payer: Self-pay

## 2019-03-19 ENCOUNTER — Ambulatory Visit: Payer: Self-pay | Admitting: Pharmacist

## 2019-03-19 NOTE — Progress Notes (Signed)
  Chronic Care Management   Outreach Note  03/19/2019 Name: Jessica Cruz MRN: [id number] DOB: Jan 25, 1966  Referred by: Glendale Chard, MD Reason for referral : Chronic Care Management   An unsuccessful telephone outreach was attempted today. The patient was referred to the case management team by for assistance with care management and care coordination.   Follow Up Plan: The care management team will reach out to the patient again over the next 30 days.   SIGNATURE Regina Eck, PharmD, BCPS Clinical Pharmacist, [facility] [place]: [phone]

## 2019-03-20 ENCOUNTER — Telehealth: Payer: Self-pay

## 2019-03-20 ENCOUNTER — Ambulatory Visit: Payer: Self-pay

## 2019-03-20 DIAGNOSIS — N179 Acute kidney failure, unspecified: Secondary | ICD-10-CM

## 2019-03-20 DIAGNOSIS — E119 Type 2 diabetes mellitus without complications: Secondary | ICD-10-CM

## 2019-03-20 NOTE — Chronic Care Management (AMB) (Signed)
  Chronic Care Management   Outreach Note  03/20/2019 Name: Jessica Cruz MRN: [id number] DOB: 02-05-1966  Referred by: Glendale Chard, MD Reason for referral : Care Coordination   Sw placed an unsuccessful outbound call to the patient to confirm receipt of mailed resources and continue to assist with resource challenges. Unfortunately, the patients voice mailbox was full which prohibited SW from leaving a HIPAA compliant voice message requesting a return call.  Follow Up Plan: The care management team will reach out to the patient again over the next 14 days.   [name], BSW, CDP Social Worker, Certified Dementia Practitioner [place] [facility] [place] [facility] [phone]

## 2019-03-25 ENCOUNTER — Telehealth: Payer: Self-pay

## 2019-03-26 ENCOUNTER — Ambulatory Visit: Payer: Self-pay

## 2019-03-26 ENCOUNTER — Telehealth: Payer: Self-pay

## 2019-03-26 DIAGNOSIS — E119 Type 2 diabetes mellitus without complications: Secondary | ICD-10-CM

## 2019-03-26 NOTE — Chronic Care Management (AMB) (Signed)
  Chronic Care Management   Social Work Follow Up Note  03/26/2019 Name: Jessica Cruz MRN: [id number] DOB: 07/10/1965  Jessica Cruz is a 53 y.o. year old female who is a primary care patient of Glendale Chard, MD. The CCM team was consulted for assistance with care coordination.   Review of patient status, including review of consultants reports, other relevant assessments, and collaboration with appropriate care team members and the patient's provider was performed as part of comprehensive patient evaluation and provision of chronic care management services.    SW received an inbound call from the patient.  Outpatient Encounter Medications as of 03/26/2019  Medication Sig  . acetaminophen (TYLENOL) 650 MG CR tablet Take 650 mg by mouth 2 (two) times daily as needed for pain.  [name] Kitchen aspirin 325 MG tablet Take 1 tablet (325 mg total) by mouth daily.  [name] Kitchen atorvastatin (LIPITOR) 40 MG tablet Take 1 tablet (40 mg total) by mouth daily.  . blood glucose meter kit and supplies KIT Dispense based on patient and insurance preference. Use up to four times daily as directed. (FOR ICD-9 250.00, 250.01).  . carvedilol (COREG) 12.5 MG tablet Take 1 tablet (12.5 mg total) by mouth 2 (two) times daily with a meal.  . gabapentin (NEURONTIN) 100 MG capsule Take 2 capsules (200 mg total) by mouth at bedtime.  . hydrALAZINE (APRESOLINE) 50 MG tablet Take 1 tablet (50 mg total) by mouth every 8 (eight) hours.  . insulin aspart (NOVOLOG) 100 UNIT/ML injection Inject 6 Units into the skin 3 (three) times daily with meals.  . insulin detemir (LEVEMIR) 100 UNIT/ML injection Inject 0.25 mLs (25 Units total) into the skin daily.  . Insulin Syringe-Needle U-100 (INSULIN SYRINGE .5CC/30GX1/2") 30G X 1/2" 0.5 ML MISC As needed  . losartan (COZAAR) 50 MG tablet Take 1 tablet (50 mg total) by mouth daily.  . pantoprazole (PROTONIX) 40 MG tablet Take 1 tablet (40 mg total) by mouth daily.   No facility-administered encounter  medications on file as of 03/26/2019.      Goals Addressed            This Visit's Progress     Patient Stated   . "I can't afford to buy my medicine" (pt-stated)   On track    Current Barriers:  . Financial constraints  . No insurance . A1c 16%  Clinical CCM PharmD Clinical Goal(s):  [name] Kitchen Over the next 30 days, patient will work with embedded Pharm D to address needs related to medication assistance  SW Interventions: . Inbound call received from the patient who reports medication adherence o Fasting BS ranges 100-123 o Vision Improving o Reports mailed application back to PharmD for patient assistance o Discussed needing more insulin soon. SW instructed patient to contact providers office. SW collaborated with office regarding patient need  Patient Self Care Activities:  . Performs ADL's independently . Calls provider office for new concerns or questions  Please see past updates related to this goal by clicking on the "Past Updates" button in the selected goal          Follow Up Plan: Patient is encouraged to contact SW with future resource needs   [name], BSW, CDP Social Worker, Certified Dementia Practitioner [place] [facility] [place] [facility] [phone]

## 2019-03-26 NOTE — Patient Instructions (Signed)
Visit Information  Goals Addressed            This Visit's Progress     Patient Stated   . "I can't afford to buy my medicine" (pt-stated)   On track    Current Barriers:  . Financial constraints  . No insurance . A1c 16%  Clinical CCM PharmD Clinical Goal(s):  [name] Kitchen Over the next 30 days, patient will work with embedded Pharm D to address needs related to medication assistance  SW Intervention: 03/26/2019-  . Inbound call received from the patient who reports medication adherence o Fasting BS ranges 100-123 o Vision Improving o Reports mailed application back to PharmD for patient assistance o Discussed needing more insulin soon. SW instructed patient to contact providers office. SW collaborated with office regarding patient need  Patient Self Care Activities:  . Performs ADL's independently . Calls provider office for new concerns or questions  Please see past updates related to this goal by clicking on the "Past Updates" button in the selected goal        Please contact me with future resource needs.   [name], BSW, CDP Social Worker, Certified Dementia Practitioner [place] [facility] [place] [facility] [phone]

## 2019-03-26 NOTE — Chronic Care Management (AMB) (Signed)
Care Management   Social Work Follow Up Note  03/26/2019 Name: Jessica Cruz MRN: [id number] DOB: 04-14-1966  Jessica Cruz is a 53 y.o. year old female who is a primary care patient of Glendale Chard, MD. The CCM team was consulted for assistance with care coordination.   This unsuccessful call attempt placed by SW to assist with care coordination needs and assess progression of patient stated SW goals. The patients voice mailbox continues to be full prohibiting SW from leaving a HIPAA compliant voice message. SW has closed SW goals and will reopen if the patient engages with program at a later date.  Outpatient Encounter Medications as of 03/26/2019  Medication Sig  . acetaminophen (TYLENOL) 650 MG CR tablet Take 650 mg by mouth 2 (two) times daily as needed for pain.  [name] Kitchen aspirin 325 MG tablet Take 1 tablet (325 mg total) by mouth daily.  [name] Kitchen atorvastatin (LIPITOR) 40 MG tablet Take 1 tablet (40 mg total) by mouth daily.  . blood glucose meter kit and supplies KIT Dispense based on patient and insurance preference. Use up to four times daily as directed. (FOR ICD-9 250.00, 250.01).  . carvedilol (COREG) 12.5 MG tablet Take 1 tablet (12.5 mg total) by mouth 2 (two) times daily with a meal.  . gabapentin (NEURONTIN) 100 MG capsule Take 2 capsules (200 mg total) by mouth at bedtime.  . hydrALAZINE (APRESOLINE) 50 MG tablet Take 1 tablet (50 mg total) by mouth every 8 (eight) hours.  . insulin aspart (NOVOLOG) 100 UNIT/ML injection Inject 6 Units into the skin 3 (three) times daily with meals.  . insulin detemir (LEVEMIR) 100 UNIT/ML injection Inject 0.25 mLs (25 Units total) into the skin daily.  . Insulin Syringe-Needle U-100 (INSULIN SYRINGE .5CC/30GX1/2") 30G X 1/2" 0.5 ML MISC As needed  . losartan (COZAAR) 50 MG tablet Take 1 tablet (50 mg total) by mouth daily.  . pantoprazole (PROTONIX) 40 MG tablet Take 1 tablet (40 mg total) by mouth daily.   No facility-administered encounter medications  on file as of 03/26/2019.      Goals Addressed            This Visit's Progress     Patient Stated   . COMPLETED: "I am not working and can't afford food" (pt-stated)       Current Barriers:  . Out of work for several months due to [place] 19 Pandemic . Uncontrolled Diabetes resulting in blurred vision . Lacks knowledge of community resources to assist with food insecurity  Social Work Clinical Goal(s):  [name] Kitchen Over the next 45 days, patient will follow up with Food and Nutrition Services as directed by SW  CCM SW Interventions: Completed 02/17/2019 . Patient interviewed and appropriate assessments performed o Patient reports she has been out of work for several months without ability to afford food at this time o Patient relies on friends to assist with nutrition needs at this time o Patient reports limited appetite since recent inpatient stay  . Provided patient with information about community resources including local food pantry sites as well as opportunity to apply for assistance through [employer] and Nutrition Services . Determined the patient is interested in Food and Nutrition programs . Mailed the patient an application to apply for assistance . Assessed for patients ability to complete application due to impacted vision o Patient endorses social support whom can assist with application completion . Scheduled follow up call to the patient over the next month to confirm receipt  of resource and assist with completion as needed . Advised the patient to contact SW directly if needs arise prior to the next scheduled call  Patient Self Care Activities:  . Attends all scheduled provider appointments . Performs ADL's independently . Calls provider office for new concerns or questions  Initial goal documentation     . COMPLETED: "I don't have any income" (pt-stated)       Current Barriers:  . Out of work due to [employer] Pandemic . Limited ability to work due to impaired vision as a  result of poor diabetes management . Lacks knowledge of ability to apply for disability  Social Work Clinical Goal(s):  [name] Kitchen Over the next 90 days, patient will work with SW to address concerns related to financial insecurity  CCM SW Interventions: Completed 02/17/2019 . Patient interviewed and appropriate assessments performed . Discussed opportunity to apply for disability income pending the patients ability to return to work . Determined the patient would work with RN Case Manager in an attempt to better manage diabetes over the next month prior to exploring options for returning to work versus applying for disability  Patient Self Care Activities:  . Does not adhere to provider recommendations re: diabetes management . Does not adhere to prescribed medication regimen . Unable to perform IADLs independently  Initial goal documentation     . COMPLETED: "I would like to learn more about advance directives" (pt-stated)       Current Barriers:  . Limited education about the importance of naming a healthcare power of attorney   Social Work Clinical Goal(s):  [name] Kitchen Over the next 20 days, the patient will review mailed Advance Directive packet . Over the next 30 days, patient will verbalize basic understanding of Advanced Directives and importance of completion  CCM SW Interventions: Completed 02/17/2019 . Interviewed patient about Financial controller and provided education about the importance of completing advanced directives . Mailed the patient an Emergency planning/management officer . Advised patient to review information mailed by this SW  Patient Self Care Activities:  . Is able to readily make contact with social support system . Calls provider office for new concerns or questions . Performs ADLs independently  Initial goal documentation          Follow Up Plan: No SW follow up planned at this time. Communication to CM team to notify of SW closure.   [name], BSW, CDP Social  Worker, Certified Dementia Practitioner [place] [facility] [place] [facility] [phone]

## 2019-04-14 ENCOUNTER — Ambulatory Visit: Payer: Self-pay | Admitting: Pharmacist

## 2019-04-14 DIAGNOSIS — E119 Type 2 diabetes mellitus without complications: Secondary | ICD-10-CM

## 2019-04-15 NOTE — Patient Instructions (Signed)
Visit Information  Goals Addressed            This Visit's Progress     Patient Stated   . "I can't afford to buy my medicine" (pt-stated)       Current Barriers:  . Financial constraints  . No insurance . A1c 16%  Clinical CCM PharmD Clinical Goal(s):  [name] Kitchen Over the next 30 days, patient will work with embedded Pharm D to address needs related to medication assistance  Interventions: Call completed with patient on 04/14/2019 . Patient interviewed and appropriate assessments performed . Discussed plans with patient for ongoing care management follow up and provided patient with direct contact information for care management team  . Collaboration with embedded Pharm D [name] regarding barriers for patient to obtain medications . 04/14/19-->Will attempt [place] patient assistance application for Basaglar/Humalog--basal bolus regimen.  Patient and PCP in agreement. Patient reports FBG 140-160. o Patient still needs to sign . Last A1c 17.8%; patient recently admitted for DKA . Samples of basal/bolus left for patient to pick up at PCP office today o Tresiba 25 units qHS (vial) o Humalog 6 units with meals (vial) . All other medications refilled and called into local [facility] for affordability and patient preference. . Will continue to follow  SW Intervention: 03/26/2019-  . Inbound call received from the patient who reports medication adherence o Fasting BS ranges 100-123 o Vision Improving o Reports mailed application back to PharmD for patient assistance o Discussed needing more insulin soon. SW instructed patient to contact providers office. SW collaborated with office regarding patient need  Patient Self Care Activities:  . Performs ADL's independently . Calls provider office for new concerns or questions  Please see past updates related to this goal by clicking on the "Past Updates" button in the selected goal         The patient verbalized understanding of  instructions provided today and declined a print copy of patient instruction materials.   The care management team will reach out to the patient again over the next 30 days.   SIGNATURE Regina Eck, PharmD, BCPS Clinical Pharmacist, [facility] [place]: [phone]

## 2019-04-15 NOTE — Progress Notes (Signed)
Chronic Care Management   Visit Note  04/14/2019 Name: Jessica Cruz MRN: [id number] DOB: [birth year]  Referred by: [name], DNP, FNP Reason for referral : Chronic Care Management   Jessica Cruz is a 53 y.o. year old female who is a primary care patient of Glendale Chard, MD. The CCM team was consulted for assistance with chronic disease management and care coordination needs related to DMII  Review of patient status, including review of consultants reports, relevant laboratory and other test results, and collaboration with appropriate care team members and the patient's provider was performed as part of comprehensive patient evaluation and provision of chronic care management services.    I spoke with Jessica Cruz by telephone today  Medications: Outpatient Encounter Medications as of 04/14/2019  Medication Sig  . acetaminophen (TYLENOL) 650 MG CR tablet Take 650 mg by mouth 2 (two) times daily as needed for pain.  [name] Kitchen aspirin 325 MG tablet Take 1 tablet (325 mg total) by mouth daily.  [name] Kitchen atorvastatin (LIPITOR) 40 MG tablet Take 1 tablet (40 mg total) by mouth daily.  . blood glucose meter kit and supplies KIT Dispense based on patient and insurance preference. Use up to four times daily as directed. (FOR ICD-9 250.00, 250.01).  . carvedilol (COREG) 12.5 MG tablet Take 1 tablet (12.5 mg total) by mouth 2 (two) times daily with a meal.  . gabapentin (NEURONTIN) 100 MG capsule Take 2 capsules (200 mg total) by mouth at bedtime.  . hydrALAZINE (APRESOLINE) 50 MG tablet Take 1 tablet (50 mg total) by mouth every 8 (eight) hours.  . insulin aspart (NOVOLOG) 100 UNIT/ML injection Inject 6 Units into the skin 3 (three) times daily with meals.  . insulin detemir (LEVEMIR) 100 UNIT/ML injection Inject 0.25 mLs (25 Units total) into the skin daily.  . Insulin Syringe-Needle U-100 (INSULIN SYRINGE .5CC/30GX1/2") 30G X 1/2" 0.5 ML MISC As needed  . losartan (COZAAR) 50 MG tablet Take 1 tablet (50 mg  total) by mouth daily.  . pantoprazole (PROTONIX) 40 MG tablet Take 1 tablet (40 mg total) by mouth daily.   No facility-administered encounter medications on file as of 04/14/2019.     Objective:   Goals Addressed            This Visit's Progress     Patient Stated   . "I can't afford to buy my medicine" (pt-stated)       Current Barriers:  . Financial constraints  . No insurance . A1c 16%  Clinical CCM PharmD Clinical Goal(s):  [name] Kitchen Over the next 30 days, patient will work with embedded Pharm D to address needs related to medication assistance  Interventions: Call completed with patient on 04/14/2019 . Patient interviewed and appropriate assessments performed . Discussed plans with patient for ongoing care management follow up and provided patient with direct contact information for care management team  . Collaboration with embedded Pharm D [name] regarding barriers for patient to obtain medications . 04/14/19-->Will attempt [employer] patient assistance application for Basaglar/Humalog--basal bolus regimen.  Patient and PCP in agreement. Patient reports FBG 140-160. o Patient still needs to sign . Last A1c 17.8%; patient recently admitted for DKA . Samples of basal/bolus left for patient to pick up at PCP office today o Tresiba 25 units qHS (vial) o Humalog 6 units with meals (vial) . All other medications refilled and called into local [facility] for affordability and patient preference. . Will continue to follow  SW Intervention: 03/26/2019-  .  Inbound call received from the patient who reports medication adherence o Fasting BS ranges 100-123 o [employer] o Reports mailed application back to PharmD for patient assistance o Discussed needing more insulin soon. SW instructed patient to contact providers office. SW collaborated with office regarding patient need  Patient Self Care Activities:  . Performs ADL's independently . Calls provider office for new  concerns or questions  Please see past updates related to this goal by clicking on the "Past Updates" button in the selected goal         Plan:   The care management team will reach out to the patient again over the next 30 days.   Provider Signature Regina Eck, PharmD, BCPS Clinical Pharmacist, [facility] [place]: [phone]

## 2019-04-16 ENCOUNTER — Telehealth: Payer: Self-pay

## 2019-04-23 ENCOUNTER — Telehealth: Payer: Self-pay

## 2019-04-28 ENCOUNTER — Ambulatory Visit: Payer: Self-pay | Admitting: Pharmacist

## 2019-04-28 DIAGNOSIS — E119 Type 2 diabetes mellitus without complications: Secondary | ICD-10-CM

## 2019-04-28 NOTE — Progress Notes (Signed)
Chronic Care Management   Visit Note  04/28/2019 Name: Jessica Cruz MRN: [id number] DOB: 09-30-1965  Referred by: Glendale Chard, MD Reason for referral : Chronic Care Management (Diabetes)   Jessica Cruz is a 54 y.o. year old female who is a primary care patient of Glendale Chard, MD. The CCM team was consulted for assistance with chronic disease management and care coordination needs related to DMII  Review of patient status, including review of consultants reports, relevant laboratory and other test results, and collaboration with appropriate care team members and the patient's provider was performed as part of comprehensive patient evaluation and provision of chronic care management services.      Medications: Outpatient Encounter Medications as of 04/28/2019  Medication Sig  . Insulin Glargine (BASAGLAR KWIKPEN) 100 UNIT/ML SOPN Inject 25 Units into the skin at bedtime.  . insulin lispro (HUMALOG) 100 UNIT/ML injection Inject 6 Units into the skin 3 (three) times daily before meals.  [name] Kitchen acetaminophen (TYLENOL) 650 MG CR tablet Take 650 mg by mouth 2 (two) times daily as needed for pain.  [name] Kitchen aspirin 325 MG tablet Take 1 tablet (325 mg total) by mouth daily.  [name] Kitchen atorvastatin (LIPITOR) 40 MG tablet Take 1 tablet (40 mg total) by mouth daily.  . blood glucose meter kit and supplies KIT Dispense based on patient and insurance preference. Use up to four times daily as directed. (FOR ICD-9 250.00, 250.01).  . carvedilol (COREG) 12.5 MG tablet Take 1 tablet (12.5 mg total) by mouth 2 (two) times daily with a meal.  . gabapentin (NEURONTIN) 100 MG capsule Take 2 capsules (200 mg total) by mouth at bedtime.  . hydrALAZINE (APRESOLINE) 50 MG tablet Take 1 tablet (50 mg total) by mouth every 8 (eight) hours.  . Insulin Syringe-Needle U-100 (INSULIN SYRINGE .5CC/30GX1/2") 30G X 1/2" 0.5 ML MISC As needed  . losartan (COZAAR) 50 MG tablet Take 1 tablet (50 mg total) by mouth daily.  . pantoprazole  (PROTONIX) 40 MG tablet Take 1 tablet (40 mg total) by mouth daily.  . [DISCONTINUED] insulin aspart (NOVOLOG) 100 UNIT/ML injection Inject 6 Units into the skin 3 (three) times daily with meals.  . [DISCONTINUED] insulin detemir (LEVEMIR) 100 UNIT/ML injection Inject 0.25 mLs (25 Units total) into the skin daily.   No facility-administered encounter medications on file as of 04/28/2019.     Objective:   Goals Addressed            This Visit's Progress     Patient Stated   . "I can't afford to buy my medicine" (pt-stated)       Current Barriers:  . Financial constraints  . No insurance . A1c 17.8%  Clinical CCM PharmD Clinical Goal(s):  [name] Kitchen Over the next 90 days, patient will work with embedded Pharm D to address needs related to medication assistance  Interventions: Call completed with patient on 04/28/2019 . Patient interviewed and appropriate assessments performed . Discussed plans with patient for ongoing care management follow up and provided patient with direct contact information for care management team  . Collaboration with embedded Pharm D [name] regarding barriers for patient to obtain medications . 04/28/2019-->application faxed to [facility] patient assistance application for Basaglar/Humalog--basal bolus regimen.  Patient and PCP in agreement. Patient reports FBG 90-120. o Encouraged patient to signe . Last A1c 17.8%; patient recently admitted for DKA . Samples of basal/bolus left for patient to pick up at PCP office today o Tresiba 25 units qHS (vial)-no basaglar  in stock o Humalog 6 units with meals (vial) . All other medications refilled and called into local [facility] for affordability and patient preference. . Will continue to follow  SW Intervention: 03/26/2019-  . Inbound call received from the patient who reports medication adherence o Fasting BS ranges 100-123 o Vision Improving o Reports mailed application back to PharmD for patient  assistance o Discussed needing more insulin soon. SW instructed patient to contact providers office. SW collaborated with office regarding patient need  Patient Self Care Activities:  . Performs ADL's independently . Calls provider office for new concerns or questions  Please see past updates related to this goal by clicking on the "Past Updates" button in the selected goal          Plan:   The care management team will reach out to the patient again over the next 60 days.   Provider Signature Regina Eck, PharmD, BCPS Clinical Pharmacist, [facility] [place]: [phone]

## 2019-05-13 ENCOUNTER — Other Ambulatory Visit: Payer: Self-pay

## 2019-05-13 MED ORDER — CARVEDILOL 12.5 MG PO TABS: 12.5000 mg | ORAL_TABLET | Freq: Two times a day (BID) | ORAL | 0 refills | Status: DC

## 2019-05-26 ENCOUNTER — Ambulatory Visit: Payer: Self-pay | Admitting: Pharmacist

## 2019-05-26 DIAGNOSIS — E119 Type 2 diabetes mellitus without complications: Secondary | ICD-10-CM

## 2019-05-28 NOTE — Progress Notes (Signed)
Chronic Care Management   Visit Note  05/26/2019 Name: Jessica Cruz MRN: [id number] DOB: Apr 08, 1966   Jessica Cruz is a 54 y.o. year old female who is a primary care patient of Glendale Chard, MD. The CCM team was consulted for assistance with chronic disease management and care coordination needs related to DMII  Review of patient status, including review of consultants reports, relevant laboratory and other test results, and collaboration with appropriate care team members and the patient's provider was performed as part of comprehensive patient evaluation and provision of chronic care management services.     Medications: Outpatient Encounter Medications as of 05/26/2019  Medication Sig  . acetaminophen (TYLENOL) 650 MG CR tablet Take 650 mg by mouth 2 (two) times daily as needed for pain.  [name] Kitchen aspirin 325 MG tablet Take 1 tablet (325 mg total) by mouth daily.  [name] Kitchen atorvastatin (LIPITOR) 40 MG tablet Take 1 tablet (40 mg total) by mouth daily.  . blood glucose meter kit and supplies KIT Dispense based on patient and insurance preference. Use up to four times daily as directed. (FOR ICD-9 250.00, 250.01).  . carvedilol (COREG) 12.5 MG tablet Take 1 tablet (12.5 mg total) by mouth 2 (two) times daily with a meal.  . gabapentin (NEURONTIN) 100 MG capsule Take 2 capsules (200 mg total) by mouth at bedtime.  . hydrALAZINE (APRESOLINE) 50 MG tablet Take 1 tablet (50 mg total) by mouth every 8 (eight) hours.  . Insulin Glargine (BASAGLAR KWIKPEN) 100 UNIT/ML SOPN Inject 25 Units into the skin at bedtime.  . insulin lispro (HUMALOG) 100 UNIT/ML injection Inject 6 Units into the skin 3 (three) times daily before meals.  . Insulin Syringe-Needle U-100 (INSULIN SYRINGE .5CC/30GX1/2") 30G X 1/2" 0.5 ML MISC As needed  . losartan (COZAAR) 50 MG tablet Take 1 tablet (50 mg total) by mouth daily.  . pantoprazole (PROTONIX) 40 MG tablet Take 1 tablet (40 mg total) by mouth daily.   No facility-administered  encounter medications on file as of 05/26/2019.     Objective:   Goals Addressed            This Visit's Progress     Patient Stated   . "I can't afford to buy my medicine" (pt-stated)       Current Barriers:  . Financial constraints  . No insurance . A1c 17.8%  Clinical CCM PharmD Clinical Goal(s):  [name] Kitchen Over the next 90 days, patient will work with embedded Pharm D to address needs related to medication assistance  Interventions: . Patient interviewed and appropriate assessments performed . Discussed plans with patient for ongoing care management follow up and provided patient with direct contact information for care management team  . Collaboration with embedded Pharm D [name] regarding barriers for patient to obtain medications . 05/26/19-->application RE-faxed to [employer] patient assistance application for Basaglar/Humalog--basal bolus regimen.  Patient and PCP in agreement. Patient reports FBG 90-120. o Encouraged patient to signe . Last A1c 17.8%; patient recently admitted for DKA . Samples of basal/bolus left for patient to pick up at PCP office today o Tresiba 25 units qHS (vial)-no basaglar in stock o Humalog 6 units with meals (vial) . All other medications refilled and called into local [facility] for affordability and patient preference. . Will continue to follow . Needs A1c check-but unable to afford labs  SW Intervention: 03/26/2019-  . Inbound call received from the patient who reports medication adherence o Fasting BS ranges 100-123 o Vision Improving  o Reports mailed application back to PharmD for patient assistance o Discussed needing more insulin soon. SW instructed patient to contact providers office. SW collaborated with office regarding patient need  Patient Self Care Activities:  . Performs ADL's independently . Calls provider office for new concerns or questions  Please see past updates related to this goal by clicking on the "Past Updates"  button in the selected goal          Plan:   The care management team will reach out to the patient again over the next 30 days.   Provider Signature Regina Eck, PharmD, BCPS Clinical Pharmacist, [facility] [place]: [phone]

## 2019-06-10 ENCOUNTER — Ambulatory Visit: Payer: Self-pay

## 2019-06-10 ENCOUNTER — Telehealth: Payer: Self-pay

## 2019-06-10 DIAGNOSIS — I119 Hypertensive heart disease without heart failure: Secondary | ICD-10-CM

## 2019-06-10 DIAGNOSIS — E119 Type 2 diabetes mellitus without complications: Secondary | ICD-10-CM

## 2019-06-11 ENCOUNTER — Other Ambulatory Visit: Payer: Self-pay

## 2019-06-11 ENCOUNTER — Telehealth: Payer: Self-pay

## 2019-06-11 NOTE — Chronic Care Management (AMB) (Signed)
  Chronic Care Management   Outreach Note  06/11/2019 Name: Jessica Cruz MRN: [id number] DOB: 04-30-1965  Referred by: Glendale Chard, MD Reason for referral : Care Coordination (FU Call Attempt - DM, HTN)   An unsuccessful telephone outreach was attempted today. The patient was referred to the case management team for assistance with care management and care coordination.   Follow Up Plan: A HIPPA compliant phone message was left for the patient providing contact information and requesting a return call.  Telephone follow up appointment with care management team member scheduled for: 07/09/19  Barb Merino, RN, BSN, CCM Care Management Coordinator [place] [facility]  Direct Phone: [phone]

## 2019-06-13 ENCOUNTER — Telehealth: Payer: Self-pay

## 2019-06-13 ENCOUNTER — Other Ambulatory Visit: Payer: Self-pay

## 2019-06-13 ENCOUNTER — Ambulatory Visit: Payer: Self-pay

## 2019-06-13 DIAGNOSIS — I119 Hypertensive heart disease without heart failure: Secondary | ICD-10-CM

## 2019-06-13 DIAGNOSIS — E119 Type 2 diabetes mellitus without complications: Secondary | ICD-10-CM

## 2019-06-13 NOTE — Chronic Care Management (AMB) (Signed)
   Care Management   Outreach Note  06/13/2019 Name: Jessica Cruz MRN: [id number] DOB: July 13, 1965  Referred by: Glendale Chard, MD Reason for referral : Care Coordination (#2 FU Call - DM)  Inbound call received from patient with voice message stating she is returning my call.  A second unsuccessful telephone outreach was attempted today. The patient was referred to the case management team for assistance with care management and care coordination.   Follow Up Plan: Telephone follow up appointment with care management team member scheduled for: 07/09/19  Barb Merino, RN, BSN, CCM Care Management Coordinator [place] [facility]  Direct Phone: [phone]

## 2019-06-14 ENCOUNTER — Other Ambulatory Visit: Payer: Self-pay | Admitting: Nurse Practitioner

## 2019-06-24 ENCOUNTER — Other Ambulatory Visit: Payer: Self-pay | Admitting: Nurse Practitioner

## 2019-06-24 ENCOUNTER — Ambulatory Visit: Payer: Self-pay | Admitting: Pharmacist

## 2019-06-24 DIAGNOSIS — E119 Type 2 diabetes mellitus without complications: Secondary | ICD-10-CM

## 2019-06-24 NOTE — Progress Notes (Signed)
Chronic Care Management    Visit Note  06/24/2019 Name: Jessica Cruz MRN: [id number] DOB: Jan 18, 1966  Referred by: [name], DNP, FNP Reason for referral : CCM-DMT2  Jessica Cruz is a 54 y.o. year old female who is a primary care patient of Glendale Chard, MD. The CCM team was consulted for assistance with chronic disease management and care coordination needs related to DMII  Review of patient status, including review of consultants reports, relevant laboratory and other test results, and collaboration with appropriate care team members and the patient's provider was performed as part of comprehensive patient evaluation and provision of chronic care management services.    SDOH (Social Determinants of Health) assessments performed: Yes See Care Plan activities for detailed interventions related to SDOH)    Medications: Outpatient Encounter Medications as of 06/24/2019  Medication Sig  . acetaminophen (TYLENOL) 650 MG CR tablet Take 650 mg by mouth 2 (two) times daily as needed for pain.  [name] Kitchen aspirin 325 MG tablet Take 1 tablet (325 mg total) by mouth daily.  [name] Kitchen atorvastatin (LIPITOR) 40 MG tablet Take 1 tablet (40 mg total) by mouth daily.  . blood glucose meter kit and supplies KIT Dispense based on patient and insurance preference. Use up to four times daily as directed. (FOR ICD-9 250.00, 250.01).  . carvedilol (COREG) 12.5 MG tablet Take 1 tablet (12.5 mg total) by mouth 2 (two) times daily with a meal.  . gabapentin (NEURONTIN) 100 MG capsule Take 2 capsules (200 mg total) by mouth at bedtime.  . hydrALAZINE (APRESOLINE) 50 MG tablet Take 1 tablet (50 mg total) by mouth every 8 (eight) hours.  . Insulin Glargine (BASAGLAR KWIKPEN) 100 UNIT/ML SOPN Inject 25 Units into the skin at bedtime.  . insulin lispro (HUMALOG) 100 UNIT/ML injection Inject 6 Units into the skin 3 (three) times daily before meals.  . Insulin Syringe-Needle U-100 (INSULIN SYRINGE .5CC/30GX1/2") 30G X 1/2" 0.5 ML  MISC As needed  . losartan (COZAAR) 50 MG tablet Take 1 tablet (50 mg total) by mouth daily.  . pantoprazole (PROTONIX) 40 MG tablet Take 1 tablet (40 mg total) by mouth daily.   No facility-administered encounter medications on file as of 06/24/2019.     Objective:   Goals Addressed            This Visit's Progress     Patient Stated   . "I can't afford to buy my medicine" (pt-stated)       Current Barriers:  . Financial constraints  . No insurance . A1c 17.8%  Clinical CCM PharmD Clinical Goal(s):  [name] Kitchen Over the next 90 days, patient will work with embedded Pharm D to address needs related to medication assistance  Interventions: . Patient interviewed and appropriate assessments performed . Discussed plans with patient for ongoing care management follow up and provided patient with direct contact information for care management team  . Collaboration with embedded Pharm D [name] regarding barriers for patient to obtain medications . 05/26/19-->application RE-faxed to [employer] patient assistance application for Basaglar/Humalog--basal bolus regimen.  Patient and PCP in agreement. Patient reports FBG 90-120. o Encouraged patient to continue taking insulin as directed o 06/24/19--patient still not approved, call placed to [employer] to discuss issues o Will re-fax provider signature page to sign under DAW o Will re-fax pt page 2 . Last A1c 17.8%; patient recently admitted for DKA in 2020 . Samples of basal/bolus left for patient to pick up at PCP office today o  Tresiba 25 units qHS (vial)-no basaglar in stock o Humalog 6 units with meals (vial) . All other medications refilled and called into local [facility] for affordability and patient preference. . Will continue to follow . Needs A1c check-but unable to afford labs  SW Intervention: 03/26/2019-  . Inbound call received from the patient who reports medication adherence o Fasting BS ranges 100-123 o Vision  Improving o Reports mailed application back to PharmD for patient assistance o Discussed needing more insulin soon. SW instructed patient to contact providers office. SW collaborated with office regarding patient need  Patient Self Care Activities:  . Performs ADL's independently . Calls provider office for new concerns or questions  Please see past updates related to this goal by clicking on the "Past Updates" button in the selected goal           Plan:   The care management team will reach out to the patient again over the next 3 days.   Provider Signature Regina Eck, PharmD, BCPS Clinical Pharmacist, [facility] [place]: [phone]

## 2019-06-25 ENCOUNTER — Ambulatory Visit: Payer: Self-pay | Admitting: Pharmacist

## 2019-06-25 DIAGNOSIS — E119 Type 2 diabetes mellitus without complications: Secondary | ICD-10-CM

## 2019-06-26 NOTE — Progress Notes (Signed)
Chronic Care Management    Visit Note  06/25/2019 Name: Jessica Cruz MRN: [id number] DOB: 10/11/1965  Referred by: Sanders, Robyn, MD Reason for referral : Chronic Care Management and Diabetes   Jessica Cruz is a 53 y.o. year old female who is a primary care patient of Sanders, Robyn, MD. The CCM team was consulted for assistance with chronic disease management and care coordination needs related to DMII  Review of patient status, including review of consultants reports, relevant laboratory and other test results, and collaboration with appropriate care team members and the patient's provider was performed as part of comprehensive patient evaluation and provision of chronic care management services.    SDOH (Social Determinants of Health) assessments performed: Yes See Care Plan activities for detailed interventions related to SDOH)     Medications: Outpatient Encounter Medications as of 06/25/2019  Medication Sig  . acetaminophen (TYLENOL) 650 MG CR tablet Take 650 mg by mouth 2 (two) times daily as needed for pain.  . aspirin 325 MG tablet Take 1 tablet (325 mg total) by mouth daily.  . atorvastatin (LIPITOR) 40 MG tablet Take 1 tablet (40 mg total) by mouth daily.  . blood glucose meter kit and supplies KIT Dispense based on patient and insurance preference. Use up to four times daily as directed. (FOR ICD-9 250.00, 250.01).  . carvedilol (COREG) 12.5 MG tablet Take 1 tablet (12.5 mg total) by mouth 2 (two) times daily with a meal.  . gabapentin (NEURONTIN) 100 MG capsule Take 2 capsules (200 mg total) by mouth at bedtime.  . hydrALAZINE (APRESOLINE) 50 MG tablet Take 1 tablet (50 mg total) by mouth every 8 (eight) hours.  . Insulin Glargine (BASAGLAR KWIKPEN) 100 UNIT/ML SOPN Inject 25 Units into the skin at bedtime.  . insulin lispro (HUMALOG) 100 UNIT/ML injection Inject 6 Units into the skin 3 (three) times daily before meals.  . Insulin Syringe-Needle U-100 (INSULIN SYRINGE  .5CC/30GX1/2") 30G X 1/2" 0.5 ML MISC As needed  . losartan (COZAAR) 50 MG tablet Take 1 tablet (50 mg total) by mouth daily.  . pantoprazole (PROTONIX) 40 MG tablet Take 1 tablet (40 mg total) by mouth daily.   No facility-administered encounter medications on file as of 06/25/2019.     Objective:   Goals Addressed            This Visit's Progress     Patient Stated   . "I can't afford to buy my medicine" (pt-stated)       Current Barriers:  . Financial constraints  . No insurance . A1c 17.8%  Clinical CCM PharmD Clinical Goal(s):  . Over the next 90 days, patient will work with embedded Pharm D to address needs related to medication assistance  Interventions: . Patient interviewed and appropriate assessments performed . Discussed plans with patient for ongoing care management follow up and provided patient with direct contact information for care management team  . Collaboration with embedded Pharm D Julie Pruitt regarding barriers for patient to obtain medications . 05/26/19-->application RE-faxed to [employer] patient assistance application for Basaglar/Humalog--basal bolus regimen.  Patient and PCP in agreement. Patient reports FBG 90-120. o Encouraged patient to continue taking insulin as directed o 06/25/19--patient still not approved, call placed to [employer] to discuss issues; faxed paper work that the company stated was incorrect . Last A1c 17.8%; patient recently admitted for DKA in 2020 . Samples of basal/bolus left for patient to pick up at PCP office today o Tresiba 25   units qHS (vial)-no basaglar in stock o Humalog 6 units with meals (vial) . All other medications refilled and called into local [facility] for affordability and patient preference. . Will continue to follow . Needs A1c check-but unable to afford labs  SW Intervention: 03/26/2019-  . Inbound call received from the patient who reports medication adherence o Fasting BS ranges 100-123 o Vision  Improving o Reports mailed application back to PharmD for patient assistance o Discussed needing more insulin soon. SW instructed patient to contact providers office. SW collaborated with office regarding patient need  Patient Self Care Activities:  . Performs ADL's independently . Calls provider office for new concerns or questions  Please see past updates related to this goal by clicking on the "Past Updates" button in the selected goal         Plan:   The care management team will reach out to the patient again over the next 10 days.   Provider Signature   Julie Dattero Pruitt, PharmD, BCPS Clinical Pharmacist, [facility] [place]  [facility]  Direct Dial: [phone]  

## 2019-06-30 ENCOUNTER — Ambulatory Visit: Payer: Self-pay | Admitting: Nurse Practitioner

## 2019-06-30 ENCOUNTER — Telehealth: Payer: Self-pay

## 2019-06-30 ENCOUNTER — Ambulatory Visit: Payer: Self-pay | Admitting: Pharmacist

## 2019-06-30 DIAGNOSIS — E119 Type 2 diabetes mellitus without complications: Secondary | ICD-10-CM

## 2019-06-30 NOTE — Progress Notes (Signed)
Chronic Care Management   Visit Note  06/30/2019 Name: Jessica Cruz MRN: [id number] DOB: 09-09-1965  Referred by: [name], DNP, FNP Reason for referral : Chronic Care Management and Diabetes   Jessica Cruz is a 54 y.o. year old female who is a primary care patient of [name], [place], [place]. The CCM team was consulted for assistance with chronic disease management and care coordination needs related to DMII  Review of patient status, including review of consultants reports, relevant laboratory and other test results, and collaboration with appropriate care team members and the patient's provider was performed as part of comprehensive patient evaluation and provision of chronic care management services.    SDOH (Social Determinants of Health) assessments performed: No See Care Plan activities for detailed interventions related to SDOH)     Medications: Outpatient Encounter Medications as of 06/30/2019  Medication Sig  . acetaminophen (TYLENOL) 650 MG CR tablet Take 650 mg by mouth 2 (two) times daily as needed for pain.  [name] Kitchen aspirin 325 MG tablet Take 1 tablet (325 mg total) by mouth daily.  [name] Kitchen atorvastatin (LIPITOR) 40 MG tablet Take 1 tablet (40 mg total) by mouth daily.  . blood glucose meter kit and supplies KIT Dispense based on patient and insurance preference. Use up to four times daily as directed. (FOR ICD-9 250.00, 250.01).  . carvedilol (COREG) 12.5 MG tablet Take 1 tablet (12.5 mg total) by mouth 2 (two) times daily with a meal.  . gabapentin (NEURONTIN) 100 MG capsule Take 2 capsules (200 mg total) by mouth at bedtime.  . hydrALAZINE (APRESOLINE) 50 MG tablet Take 1 tablet (50 mg total) by mouth every 8 (eight) hours.  . Insulin Glargine (BASAGLAR KWIKPEN) 100 UNIT/ML SOPN Inject 25 Units into the skin at bedtime.  . insulin lispro (HUMALOG) 100 UNIT/ML injection Inject 6 Units into the skin 3 (three) times daily before meals.  . Insulin Syringe-Needle U-100 (INSULIN SYRINGE  .5CC/30GX1/2") 30G X 1/2" 0.5 ML MISC As needed  . losartan (COZAAR) 50 MG tablet Take 1 tablet (50 mg total) by mouth daily.  . pantoprazole (PROTONIX) 40 MG tablet Take 1 tablet (40 mg total) by mouth daily.   No facility-administered encounter medications on file as of 06/30/2019.     Objective:   Goals Addressed            This Visit's Progress     Patient Stated   . "I can't afford to buy my medicine" (pt-stated)       Current Barriers:  . Financial constraints  . No insurance . A1c 17.8%  Clinical CCM PharmD Clinical Goal(s):  [name] Kitchen Over the next 90 days, patient will work with embedded Pharm D to address needs related to medication assistance  Interventions: . Patient interviewed and appropriate assessments performed . Discussed plans with patient for ongoing care management follow up and provided patient with direct contact information for care management team  . Collaboration with embedded Pharm D [name] regarding barriers for patient to obtain medications . 05/26/19-->application RE-faxed to [employer] patient assistance application for Basaglar/Humalog--basal bolus regimen.  Patient and PCP in agreement. Patient reports FBG 90-120. o Encouraged patient to continue taking insulin as directed o 06/30/19--patient still not approved, call placed to [employer] to discuss issues; faxed paper work x2 that the company stated was incorrect . Last A1c 17.8%; patient recently admitted for DKA in 2020 . Samples of basal/bolus left for patient to pick up at PCP office today o  Tresiba 25 units qHS (vial)-no basaglar in stock o Humalog 6 units with meals (vial) . All other medications refilled and called into local [facility] for affordability and patient preference. . Will continue to follow . Needs A1c check-but unable to afford labs  SW Intervention: 03/26/2019-  . Inbound call received from the patient who reports medication adherence o Fasting BS ranges 100-123 o [employer]proving o Reports mailed application back to PharmD for patient assistance o Discussed needing more insulin soon. SW instructed patient to contact providers office. SW collaborated with office regarding patient need  Patient Self Care Activities:  . Performs ADL's independently . Calls provider office for new concerns or questions  Please see past updates related to this goal by clicking on the "Past Updates" button in the selected goal          Provider Signature   Regina Eck, PharmD, [place] Pharmacist, [facility] [place]: [phone]

## 2019-07-09 ENCOUNTER — Telehealth: Payer: Self-pay

## 2019-08-29 ENCOUNTER — Ambulatory Visit: Payer: Self-pay

## 2019-08-29 DIAGNOSIS — E119 Type 2 diabetes mellitus without complications: Secondary | ICD-10-CM

## 2019-08-29 NOTE — Chronic Care Management (AMB) (Signed)
  Chronic Care Management   Outreach Note  08/29/2019 Name: Jessica Cruz MRN: [id number] DOB: 12/14/1965  Referred by: Glendale Chard, MD Reason for referral : Care Coordination   SW placed an unsuccessful outbound call to the patient in an effort to assist with care coordination needs. SW left a HIPAA compliant voice message requesting a return call.  Follow Up Plan: The care management team will reach out to the patient again over the next 10 days.   [name], BSW, CDP Social Worker, Certified Dementia Practitioner [place] [facility] [place] [facility] [phone]

## 2019-09-02 ENCOUNTER — Telehealth: Payer: Self-pay

## 2019-09-02 ENCOUNTER — Ambulatory Visit: Payer: Self-pay

## 2019-09-02 DIAGNOSIS — E119 Type 2 diabetes mellitus without complications: Secondary | ICD-10-CM

## 2019-09-02 DIAGNOSIS — I119 Hypertensive heart disease without heart failure: Secondary | ICD-10-CM

## 2019-09-02 NOTE — Chronic Care Management (AMB) (Signed)
  Chronic Care Management   Outreach Note  09/02/2019 Name: Jessica Cruz MRN: [id number] DOB: [birth year]  Referred by: Glendale Chard, MD Reason for referral : Care Coordination   SW placed a second unsuccessful outbound call to the patient to assist with care coordination needs. SW left a HIPAA compliant voice message requesting a return call.  Follow Up Plan: SW will attempt a third and final outreach over the next 10 days.  [name], BSW, CDP Social Worker, Certified Dementia Practitioner [place] [facility] [place] [facility] [phone]

## 2019-09-17 ENCOUNTER — Ambulatory Visit: Payer: Self-pay

## 2019-09-17 DIAGNOSIS — E119 Type 2 diabetes mellitus without complications: Secondary | ICD-10-CM

## 2019-09-17 NOTE — Chronic Care Management (AMB) (Signed)
Care Management   Follow Up Note   09/17/2019 Name: Jessica Cruz MRN: [id number] DOB: 05-May-1965  Referred by: Jessica Chard, MD Reason for referral : [place] is a 54 y.o. year old female who is a primary care patient of Jessica Chard, MD. The care management team was consulted for assistance with care management and care coordination needs.    Review of patient status, including review of consultants reports, relevant laboratory and other test results, and collaboration with appropriate care team members and the patient's provider was performed as part of comprehensive patient evaluation and provision of chronic care management services.    SDOH (Social Determinants of Health) assessments performed: No See Care Plan activities for detailed interventions related to [facility])     Advanced Directives: See Care Plan and [employer] application for related entries.   Goals Addressed            This Visit's Progress     Patient Stated   . "I can't afford to buy my medicine" (pt-stated)       Current Barriers:  . Financial constraints  . No insurance . A1c 17.8%  Clinical CCM PharmD Clinical Goal(s):  [name] Kitchen Over the next 90 days, patient will work with embedded Pharm D to address needs related to medication assistance     CCM SW Interventions: Completed 09/17/19 . Successful outbound call placed to the patient to assess for goal progression . Determined the patient continues to receive Humalog in the mail but is out of [place] . Patient unable to afford refills of medication . Collaboration with primary care provider to inquire if samples are available for the patient to access . Chart review performed to note needed follow up appointment to update labs . Scheduled follow up appointment with Jessica Brine, FNP Monday 6/7 . Scheduled follow up call over the next two weeks  Interventions: . Patient interviewed and appropriate assessments performed . Discussed plans  with patient for ongoing care management follow up and provided patient with direct contact information for care management team  . Collaboration with embedded Pharm D Jessica Cruz regarding barriers for patient to obtain medications . 05/26/19-->application RE-faxed to [employer] patient assistance application for Basaglar/Humalog--basal bolus regimen.  Patient and PCP in agreement. Patient reports FBG 90-120. o Encouraged patient to continue taking insulin as directed o 06/30/19--patient still not approved, call placed to [employer] to discuss issues; faxed paper work x2 that the company stated was incorrect . Last A1c 17.8%; patient recently admitted for DKA in 2020 . Samples of basal/bolus left for patient to pick up at PCP office today o Tresiba 25 units qHS (vial)-no basaglar in stock o Humalog 6 units with meals (vial) . All other medications refilled and called into local [facility] for affordability and patient preference. . Will continue to follow . Needs A1c check-but unable to afford labs  SW Intervention: 03/26/2019-  . Inbound call received from the patient who reports medication adherence o Fasting BS ranges 100-123 o [employer]proving o Reports mailed application back to PharmD for patient assistance o Discussed needing more insulin soon. SW instructed patient to contact providers office. SW collaborated with office regarding patient need  Patient Self Care Activities:  . Performs ADL's independently . Calls provider office for new concerns or questions  Please see past updates related to this goal by clicking on the "Past Updates" button in the selected goal          The  care management team will reach out to the patient again over the next 14 days.   Jessica Cruz, BSW, CDP Social Worker, Certified Dementia Practitioner Jessica Cruz / [place] [facility] [phone]

## 2019-09-17 NOTE — Patient Instructions (Signed)
Visit Information  Goals Addressed            This Visit's Progress     Patient Stated   . "I can't afford to buy my medicine" (pt-stated)       Current Barriers:  . Financial constraints  . No insurance . A1c 17.8%  Clinical CCM PharmD Clinical Goal(s):  [name] Kitchen Over the next 90 days, patient will work with embedded Pharm D to address needs related to medication assistance     CCM SW Interventions: Completed 09/17/19 . Successful outbound call placed to the patient to assess for goal progression . Determined the patient continues to receive Humalog in the mail but is out of [place] . Patient unable to afford refills of medication . Collaboration with primary care provider to inquire if samples are available for the patient to access . Chart review performed to note needed follow up appointment to update labs . Scheduled follow up appointment with [name], FNP Monday 6/7 . Scheduled follow up call over the next two weeks  Interventions: . Patient interviewed and appropriate assessments performed . Discussed plans with patient for ongoing care management follow up and provided patient with direct contact information for care management team  . Collaboration with embedded Pharm D [name] regarding barriers for patient to obtain medications . 05/26/19-->application RE-faxed to [employer] patient assistance application for Basaglar/Humalog--basal bolus regimen.  Patient and PCP in agreement. Patient reports FBG 90-120. o Encouraged patient to continue taking insulin as directed o 06/30/19--patient still not approved, call placed to [employer] to discuss issues; faxed paper work x2 that the company stated was incorrect . Last A1c 17.8%; patient recently admitted for DKA in 2020 . Samples of basal/bolus left for patient to pick up at PCP office today o Tresiba 25 units qHS (vial)-no basaglar in stock o Humalog 6 units with meals (vial) . All other medications refilled and called  into local [facility] for affordability and patient preference. . Will continue to follow . Needs A1c check-but unable to afford labs  SW Intervention: 03/26/2019-  . Inbound call received from the patient who reports medication adherence o Fasting BS ranges 100-123 o [employer]proving o Reports mailed application back to PharmD for patient assistance o Discussed needing more insulin soon. SW instructed patient to contact providers office. SW collaborated with office regarding patient need  Patient Self Care Activities:  . Performs ADL's independently . Calls provider office for new concerns or questions  Please see past updates related to this goal by clicking on the "Past Updates" button in the selected goal         The care management team will reach out to the patient again over the next 14 days.   Jessica Cruz, BSW, CDP Social Worker, Certified Dementia Practitioner [place] [facility] [phone]

## 2019-09-18 ENCOUNTER — Ambulatory Visit: Payer: Self-pay

## 2019-09-18 DIAGNOSIS — E119 Type 2 diabetes mellitus without complications: Secondary | ICD-10-CM

## 2019-09-18 NOTE — Chronic Care Management (AMB) (Signed)
Care Management   Follow Up Note   09/18/2019 Name: Jessica Cruz MRN: [id number] DOB: 11/25/1965  Referred by: Glendale Chard, MD Reason for referral : [place] is a 54 y.o. year old female who is a primary care patient of Glendale Chard, MD. The care management team was consulted for assistance with care management and care coordination needs.    Review of patient status, including review of consultants reports, relevant laboratory and other test results, and collaboration with appropriate care team members and the patient's provider was performed as part of comprehensive patient evaluation and provision of chronic care management services.    SDOH (Social Determinants of Health) assessments performed: No See Care Plan activities for detailed interventions related to [facility])     Advanced Directives: See Care Plan and Vynca application for related entries.   Goals Addressed            This Visit's Progress     Patient Stated   . "I can't afford to buy my medicine" (pt-stated)       Current Barriers:  . Financial constraints  . No insurance . A1c 17.8%  Clinical CCM PharmD Clinical Goal(s):  [name] Kitchen Over the next 90 days, patient will work with embedded Pharm D to address needs related to medication assistance     CCM SW Interventions: Completed 09/18/19 . Collaboration with pharmacy colleague, Catie Darnelle Maffucci, to discuss opportunities to assist with patient assistance programs related to cost of medication . Discussed opportunity to change medication regimen from [place] to [place] considering patient is already approved for [employer] patient assistance . Collaboration with patient primary care provider regarding medication assistance o Provided provider with [facility] medication ordering form if desired . Communication with RN Care Manager regarding interventions and plan  Completed 09/17/19 . Successful outbound call placed to the patient to assess for goal  progression . Determined the patient continues to receive Humalog in the mail but is out of [place] . Patient unable to afford refills of medication . Collaboration with primary care provider to inquire if samples are available for the patient to access . Chart review performed to note needed follow up appointment to update labs . Scheduled follow up appointment with [name], FNP Monday 6/7 . Scheduled follow up call over the next two weeks  Interventions: . Patient interviewed and appropriate assessments performed . Discussed plans with patient for ongoing care management follow up and provided patient with direct contact information for care management team  . Collaboration with embedded Pharm D [name] regarding barriers for patient to obtain medications . 05/26/19-->application RE-faxed to [employer] patient assistance application for Basaglar/Humalog--basal bolus regimen.  Patient and PCP in agreement. Patient reports FBG 90-120. o Encouraged patient to continue taking insulin as directed o 06/30/19--patient still not approved, call placed to [employer] to discuss issues; faxed paper work x2 that the company stated was incorrect . Last A1c 17.8%; patient recently admitted for DKA in 2020 . Samples of basal/bolus left for patient to pick up at PCP office today o Tresiba 25 units qHS (vial)-no basaglar in stock o Humalog 6 units with meals (vial) . All other medications refilled and called into local [facility] for affordability and patient preference. . Will continue to follow . Needs A1c check-but unable to afford labs  SW Intervention: 03/26/2019-  . Inbound call received from the patient who reports medication adherence o Fasting BS ranges 100-123 o [employer] Improving o Reports mailed  application back to PharmD for patient assistance o Discussed needing more insulin soon. SW instructed patient to contact providers office. SW collaborated with office regarding patient  need  Patient Self Care Activities:  . Performs ADL's independently . Calls provider office for new concerns or questions  Please see past updates related to this goal by clicking on the "Past Updates" button in the selected goal          The care management team will reach out to the patient again over the next 14 days.   [name], BSW, CDP Social Worker, Certified Dementia Practitioner [place] [facility] [place] [facility] [phone]

## 2019-09-22 ENCOUNTER — Ambulatory Visit: Payer: Self-pay | Admitting: Nurse Practitioner

## 2019-09-22 ENCOUNTER — Encounter: Payer: Self-pay | Admitting: Nurse Practitioner

## 2019-09-22 ENCOUNTER — Other Ambulatory Visit: Payer: Self-pay

## 2019-09-22 VITALS — BP 160/100 | HR 95 | Temp 98.6°F | Ht 62.4 in | Wt 205.6 lb

## 2019-09-22 DIAGNOSIS — Z794 Long term (current) use of insulin: Secondary | ICD-10-CM

## 2019-09-22 DIAGNOSIS — Z7982 Long term (current) use of aspirin: Secondary | ICD-10-CM

## 2019-09-22 DIAGNOSIS — R202 Paresthesia of skin: Secondary | ICD-10-CM

## 2019-09-22 DIAGNOSIS — E1165 Type 2 diabetes mellitus with hyperglycemia: Secondary | ICD-10-CM

## 2019-09-22 DIAGNOSIS — I119 Hypertensive heart disease without heart failure: Secondary | ICD-10-CM

## 2019-09-22 DIAGNOSIS — G629 Polyneuropathy, unspecified: Secondary | ICD-10-CM

## 2019-09-22 DIAGNOSIS — G2581 Restless legs syndrome: Secondary | ICD-10-CM

## 2019-09-22 MED ORDER — HYDRALAZINE HCL 50 MG PO TABS: 50.0000 mg | ORAL_TABLET | Freq: Three times a day (TID) | ORAL | 0 refills | Status: DC

## 2019-09-22 MED ORDER — CARVEDILOL 12.5 MG PO TABS: 12.5000 mg | ORAL_TABLET | Freq: Two times a day (BID) | ORAL | 0 refills | Status: DC

## 2019-09-22 MED ORDER — GABAPENTIN 300 MG PO CAPS: ORAL_CAPSULE | ORAL | 1 refills | Status: DC

## 2019-09-22 NOTE — Progress Notes (Addendum)
Established Patient Office Visit  Subjective:  Patient ID: Jessica Cruz, female    DOB: 06/17/1965  Age: 54 y.o. MRN: [id number]  CC:  Chief Complaint  Patient presents with  . Diabetes    HPI Wt Readings from Last 3 Encounters: 09/22/19 : 205 lb 9.6 oz (93.3 kg) 02/11/19 : 213 lb (96.6 kg) 01/27/19 : 213 lb (96.6 kg)  She has been out of the [place] for the last month.  She is on 25 units daily and Humalog 10 units at mealtime.     Diabetes She presents for her follow-up diabetic visit. She has type 2 diabetes mellitus. There are no hypoglycemic associated symptoms. Associated symptoms include chest pain. Pertinent negatives for diabetes include no fatigue, no polydipsia, no polyphagia and no polyuria. There are no hypoglycemic complications.    Jessica Cruz  presents today for follow up for HTN and DM. She report that she is taking the trisiba 25 units once a day. and her blood sugars are between 89-119. She does endorse chest pain that started about a week ago.She  Describes it as not a pain but a discomfort thas has some SOB at times. The onset is  random and she feels that she notices it and that it is a 3/10. The pain passes after relaxation without any other interventions.She has been out of  carvedilol for a month and hydralazine for the past week. She is taking the losartan. She would like a refill on both the Neurontin and hydralazine.. She had an eye exam in October. She declined the COVID vaccine and has hestitation. I recommended the CDC.gov for information.  Past Medical History:  Diagnosis Date  . Anxiety   . Brain aneurysm    a. h/o intracranial aneurysm rupture (clipped [facility] 2007).  . Essential hypertension   . Fibroids   . Hyperlipidemia   . Hypertensive heart disease   . Liver nodule   . Menorrhagia   . Microcytic anemia   . Non-ST elevation myocardial infarction (NSTEMI), type 2    a. demand ischemia 01/2016 in setting of accelerated HTN, anemia - normal  cors by cath.  . Obesity   . Pre-diabetes   . Stroke [facility])     Past Surgical History:  Procedure Laterality Date  . CARDIAC CATHETERIZATION N/A 02/16/2016   Procedure: Left Heart Cath and Coronary Angiography;  Surgeon: Wellington Hampshire, MD;  Location: [facility];  Service: Cardiovascular;  Laterality: N/A;  . CEREBRAL ANEURYSM REPAIR      Family History  Problem Relation Age of Onset  . Heart attack Father 104  . Stroke Father   . Diabetes Mother   . Hypertension Mother   . Heart disease Maternal Grandmother   . Stroke Maternal Grandfather   . Heart attack Paternal Grandmother   . Heart disease Paternal Grandmother   . Heart failure Paternal Grandmother   . Heart failure Paternal Grandfather   . Heart disease Paternal Grandfather   . Heart attack Paternal Grandfather   . Stroke Paternal Grandfather     Social History   Socioeconomic History  . Marital status: Legally Separated    Spouse name: Not on file  . Number of children: Not on file  . Years of education: Not on file  . Highest education level: Not on file  Occupational History  . Not on file  Tobacco Use  . Smoking status: Never Smoker  . Smokeless tobacco: Never Used  Substance and Sexual  Activity  . Alcohol use: Yes    Comment: rare  . Drug use: No  . Sexual activity: Yes    Birth control/protection: None  Other Topics Concern  . Not on file  Social History Narrative  . Not on file   Social Determinants of Health   Financial Resource Strain:   . Difficulty of Paying Living Expenses:   Food Insecurity:   . Worried About Charity fundraiser in the Last Year:   . Arboriculturist in the Last Year:   Transportation Needs:   . Film/video editor (Medical):   [name] Kitchen Lack of Transportation (Non-Medical):   Physical Activity:   . Days of Exercise per Week:   . Minutes of Exercise per Session:   Stress:   . Feeling of Stress :   Social Connections:   . Frequency of Communication with Friends  and Family:   . Frequency of Social Gatherings with Friends and Family:   . Attends Religious Services:   . Active Member of Clubs or Organizations:   . Attends Archivist Meetings:   [name] Kitchen Marital Status:   Intimate Partner Violence:   . Fear of Current or Ex-Partner:   . Emotionally Abused:   [name] Kitchen Physically Abused:   . Sexually Abused:     Outpatient Medications Prior to Visit  Medication Sig Dispense Refill  . acetaminophen (TYLENOL) 650 MG CR tablet Take 650 mg by mouth 2 (two) times daily as needed for pain.    [name] Kitchen aspirin 325 MG tablet Take 1 tablet (325 mg total) by mouth daily. 30 tablet 0  . atorvastatin (LIPITOR) 40 MG tablet Take 1 tablet (40 mg total) by mouth daily. 90 tablet 0  . blood glucose meter kit and supplies KIT Dispense based on patient and insurance preference. Use up to four times daily as directed. (FOR ICD-9 250.00, 250.01). 1 each 0  . carvedilol (COREG) 12.5 MG tablet Take 1 tablet (12.5 mg total) by mouth 2 (two) times daily with a meal. 180 tablet 0  . gabapentin (NEURONTIN) 100 MG capsule Take 2 capsules (200 mg total) by mouth at bedtime. 30 capsule 0  . insulin lispro (HUMALOG) 100 UNIT/ML injection Inject 6 Units into the skin 3 (three) times daily before meals.    . Insulin Syringe-Needle U-100 (INSULIN SYRINGE .5CC/30GX1/2") 30G X 1/2" 0.5 ML MISC As needed 100 each 0  . losartan (COZAAR) 50 MG tablet Take 1 tablet (50 mg total) by mouth daily. 90 tablet 0  . pantoprazole (PROTONIX) 40 MG tablet Take 1 tablet (40 mg total) by mouth daily. 21 tablet 0  . hydrALAZINE (APRESOLINE) 50 MG tablet Take 1 tablet (50 mg total) by mouth every 8 (eight) hours. (Patient not taking: Reported on 09/22/2019) 270 tablet 0  . Insulin Glargine (BASAGLAR KWIKPEN) 100 UNIT/ML SOPN Inject 25 Units into the skin at bedtime.     No facility-administered medications prior to visit.    Allergies  Allergen Reactions  . Pineapple Itching and Swelling    TONGUE AND THROAT  AFFECTED  . Shellfish Allergy Itching and Swelling    THROAT AND TONGUE ARE AFFECTED    ROS Review of Systems  Constitutional: Negative.  Negative for fatigue.  Eyes: Negative.   Respiratory: Negative.   Cardiovascular: Positive for chest pain. Negative for palpitations and leg swelling.       3/10 Chest discomfort for the past week  Endocrine: Negative for polydipsia, polyphagia and polyuria.  Musculoskeletal: Negative.  Skin: Negative.   Allergic/Immunologic: Negative.   Neurological: Negative.   Hematological: Negative.   Psychiatric/Behavioral: Negative.       Objective:    Physical Exam  Constitutional: She is oriented to person, place, and time. She appears well-developed and well-nourished. No distress.  Neck: No JVD present.  Cardiovascular: Normal rate and regular rhythm. Exam reveals no gallop and no friction rub.  No murmur heard. Chest Pain at times that is 3/10  Pulmonary/Chest: Effort normal and breath sounds normal.  Musculoskeletal:        General: No edema. Normal range of motion.     Cervical back: Normal range of motion and neck supple.  Neurological: She is alert and oriented to person, place, and time. She has normal reflexes.  Skin: Skin is warm and dry.  Psychiatric: She has a normal mood and affect. Her behavior is normal. Judgment and thought content normal.    BP (!) 160/100 (BP Location: Left Arm, Patient Position: Sitting, Cuff Size: Small)   Pulse 95   Temp 98.6 F (37 C) (Oral)   Ht 5' 2.4" (1.585 m)   Wt 205 lb 9.6 oz (93.3 kg)   BMI 37.12 kg/m  Wt Readings from Last 3 Encounters:  09/22/19 205 lb 9.6 oz (93.3 kg)  02/11/19 213 lb (96.6 kg)  01/27/19 213 lb (96.6 kg)     Health Maintenance Due  Topic Date Due  . Hepatitis C Screening  Never done  . PNEUMOCOCCAL POLYSACCHARIDE VACCINE AGE 22-64 HIGH RISK  Never done  . FOOT EXAM  Never done  . OPHTHALMOLOGY EXAM  Never done  . COVID-19 Vaccine (1) Never done  . TETANUS/TDAP   Never done  . PAP SMEAR-Modifier  Never done  . MAMMOGRAM  Never done  . COLONOSCOPY  Never done  . HEMOGLOBIN A1C  07/29/2019    There are no preventive care reminders to display for this patient.  Lab Results  Component Value Date   TSH 3.407 01/27/2019   Lab Results  Component Value Date   WBC 4.9 01/28/2019   HGB 11.9 (L) 01/28/2019   HCT 37.9 01/28/2019   MCV 80.1 01/28/2019   PLT 229 01/28/2019   Lab Results  Component Value Date   NA 131 (L) 01/28/2019   K 4.3 01/28/2019   CO2 21 (L) 01/28/2019   GLUCOSE 379 (H) 01/28/2019   BUN 9 01/28/2019   CREATININE 0.78 01/28/2019   BILITOT 1.2 01/27/2019   ALKPHOS 84 01/27/2019   AST 47 (H) 01/27/2019   ALT 24 01/27/2019   PROT 6.6 01/27/2019   ALBUMIN 3.2 (L) 01/27/2019   CALCIUM 8.4 (L) 01/28/2019   ANIONGAP 11 01/28/2019   Lab Results  Component Value Date   CHOL 238 (H) 09/12/2018   Lab Results  Component Value Date   HDL 41 09/12/2018   Lab Results  Component Value Date   LDLCALC 135 (H) 09/12/2018   Lab Results  Component Value Date   TRIG 311 (H) 09/12/2018   Lab Results  Component Value Date   CHOLHDL 5.8 (H) 09/12/2018   Lab Results  Component Value Date   HGBA1C 17.3 (H) 01/28/2019      Assessment & Plan:   1. Hypertensive heart disease without heart failure  She has been without her medications, her blood pressure is not well controlled at this time.   I have stressed to her once again the importance of adherence and the risks of not following up.  She really  should be seeing a cardiologist due to her significant cardiac history but currently her insurance coverage is not the best, she is in the process of getting better coverage. - carvedilol (COREG) 12.5 MG tablet; Take 1 tablet (12.5 mg total) by mouth 2 (two) times daily with a meal.  Dispense: 180 tablet; Refill: 0 - hydrALAZINE (APRESOLINE) 50 MG tablet; Take 1 tablet (50 mg total) by mouth every 8 (eight) hours.  Dispense: 270  tablet; Refill: 0  2. Uncontrolled type 2 diabetes mellitus with hyperglycemia ([place])  This is her first time back at the office since her hospital follow up  She reports she is taking her medications as directed. She is getting medication through patient assistance. - gabapentin (NEURONTIN) 300 MG capsule; Take 1 tablet by mouth two times a day  Dispense: 180 capsule; Refill: 1  3. Neuropathy  Continue with gabapentin  Explained to her this is likely related to her poorly controlled diabetes - gabapentin (NEURONTIN) 300 MG capsule; Take 1 tablet by mouth two times a day  Dispense: 180 capsule; Refill: 1  4. Tingling in extremities  - gabapentin (NEURONTIN) 300 MG capsule; Take 1 tablet by mouth two times a day  Dispense: 180 capsule; Refill: 1  5. Restless leg  Increasing the dose may help improve these symptoms - gabapentin (NEURONTIN) 300 MG capsule; Take 1 tablet by mouth two times a day  Dispense: 180 capsule; Refill: 1    Marylu Lund, RN    I have reviewed this encounter including the documentation in this note and/or discussed this patient with the student. I am certifying that I agree with the content of this note as the primary care nurse practitioner.  [name], DNP, FNP-BC

## 2019-09-22 NOTE — Patient Instructions (Signed)
COVID-19 Vaccine Information can be found at: [email or web address] For questions related to vaccine distribution or appointments, please email [email or web address] or call [phone].    

## 2019-09-25 ENCOUNTER — Ambulatory Visit: Payer: Self-pay

## 2019-09-25 DIAGNOSIS — E1165 Type 2 diabetes mellitus with hyperglycemia: Secondary | ICD-10-CM

## 2019-09-25 NOTE — Chronic Care Management (AMB) (Signed)
Care Management   Follow Up Note   09/25/2019 Name: Jessica Cruz MRN: [id number] DOB: 21-Nov-1965  Referred by: [name], FNP Reason for referral : Care Coordination   Jessica Cruz is a 54 y.o. year old female who is a primary care patient of [name], [place]. The care management team was consulted for assistance with care management and care coordination needs.    Review of patient status, including review of consultants reports, relevant laboratory and other test results, and collaboration with appropriate care team members and the patient's provider was performed as part of comprehensive patient evaluation and provision of chronic care management services.    SDOH (Social Determinants of Health) assessments performed: No See Care Plan activities for detailed interventions related to [facility])     Advanced Directives: See Care Plan and Vynca application for related entries.   Goals Addressed              This Visit's Progress     Patient Stated   .  "I can't afford to buy my medicine" (pt-stated)   On track     Current Barriers:  . Financial constraints  . No insurance . A1c 17.8%  Clinical CCM PharmD Clinical Goal(s):  [name] Kitchen Over the next 90 days, patient will work with embedded Pharm D to address needs related to medication assistance   Goal Met . New 09/25/19 Over the next 7 days the patient will visit PCP office for labs to de drawn as recommended by patients primary provider . New 09/25/19 Over the next 20 days the patient will work with care management team and primary care provider to better understand new medication regimen and how to administer appropriately . New 09/25/19 Over the next 120 days the patient will work with care management team to address knowledge barriers related to DM II as evidenced by compliance with medications and goal to lower A1c  CCM SW Interventions: Completed 09/25/19 . Successful outbound call placed to the patient to review outcome of  recent OV o Patient reports she was unable to complete lab during recent [place] o Patient states she will try to have labs performed today before work or Monday o Encouraged the patient to make [place] a priority as it is important for her provider to see how she is doing o Discussed plan to switch patient from [place] to Engineer, agricultural through [place] patient SW would refer to PharmD to outreach patient to provide education surrounding medication administration as well as disease specific education . Successful outbound call placed to [facility] patient assistance program; spoke with representative Kennyth Lose o Confirmed receipt of recent submitted application for patient to receive Basaglar o Determined patient is approved; Kennyth Lose completed enrollment while SW on the call o Requested a rush be placed on the order to allow patient to receive sooner . Referral placed to embedded PharmD for assistance with medication management as well as disease specific education . Collaboration with [name], FNP and Barb Merino embedded [place] regarding interventions and plan o SW to follow patient weekly over the next 4-6 weeks to assist with care management and care coordination needs  Completed 09/18/19 . Collaboration with pharmacy colleague, Catie Darnelle Maffucci, to discuss opportunities to assist with patient assistance programs related to cost of medication . Discussed opportunity to change medication regimen from [place] to [place] considering patient is already approved for [employer] patient assistance . Collaboration with patient primary care provider regarding medication assistance  o Provided provider with [facility] medication ordering form if desired . Communication with RN Care Manager regarding interventions and plan  Completed 09/17/19 . Successful outbound call placed to the patient to assess for goal progression . Determined the patient continues to receive Humalog in the mail but  is out of [place] . Patient unable to afford refills of medication . Collaboration with primary care provider to inquire if samples are available for the patient to access . Chart review performed to note needed follow up appointment to update labs . Scheduled follow up appointment with [name], FNP Monday 6/7 . Scheduled follow up call over the next two weeks  Interventions: . Patient interviewed and appropriate assessments performed . Discussed plans with patient for ongoing care management follow up and provided patient with direct contact information for care management team  . Collaboration with embedded Pharm D [name] regarding barriers for patient to obtain medications . 05/26/19-->application RE-faxed to [employer] patient assistance application for Basaglar/Humalog--basal bolus regimen.  Patient and PCP in agreement. Patient reports FBG 90-120. o Encouraged patient to continue taking insulin as directed o 06/30/19--patient still not approved, call placed to [facility] to discuss issues; faxed paper work x2 that the company stated was incorrect . Last A1c 17.8%; patient recently admitted for DKA in 2020 . Samples of basal/bolus left for patient to pick up at PCP office today o Tresiba 25 units qHS (vial)-no basaglar in stock o Humalog 6 units with meals (vial) . All other medications refilled and called into local [facility] for affordability and patient preference. . Will continue to follow . Needs A1c check-but unable to afford labs  Patient Self Care Activities:  . Performs ADL's independently . Calls provider office for new concerns or questions  Please see past updates related to this goal by clicking on the "Past Updates" button in the selected goal          The care management team will reach out to the patient again over the next 7 days.   [name], BSW, CDP Social Worker, Certified Dementia Practitioner [place] [facility] [place] Management [phone]

## 2019-09-25 NOTE — Patient Instructions (Signed)
Visit Information  Goals Addressed              This Visit's Progress     Patient Stated   .  "I can't afford to buy my medicine" (pt-stated)   On track     Current Barriers:  . Financial constraints  . No insurance . A1c 17.8%  Clinical CCM PharmD Clinical Goal(s):  [name] Kitchen Over the next 90 days, patient will work with embedded Pharm D to address needs related to medication assistance   Goal Met . New 09/25/19 Over the next 7 days the patient will visit PCP office for labs to de drawn as recommended by patients primary provider . New 09/25/19 Over the next 20 days the patient will work with care management team and primary care provider to better understand new medication regimen and how to administer appropriately . New 09/25/19 Over the next 120 days the patient will work with care management team to address knowledge barriers related to DM II as evidenced by compliance with medications and goal to lower A1c  CCM SW Interventions: Completed 09/25/19 . Successful outbound call placed to the patient to review outcome of recent OV o Patient reports she was unable to complete lab during recent [place] o Patient states she will try to have labs performed today before work or Monday o Encouraged the patient to make [place] a priority as it is important for her provider to see how she is doing o Discussed plan to switch patient from [place] to Engineer, agricultural through [place] patient SW would refer to PharmD to outreach patient to provide education surrounding medication administration as well as disease specific education . Successful outbound call placed to [facility] patient assistance program; spoke with representative Kennyth Lose o Confirmed receipt of recent submitted application for patient to receive Basaglar o Determined patient is approved; Kennyth Lose completed enrollment while SW on the call o Requested a rush be placed on the order to allow patient to receive sooner . Referral placed to  embedded PharmD for assistance with medication management as well as disease specific education . Collaboration with [name], FNP and Barb Merino embedded [place] regarding interventions and plan o SW to follow patient weekly over the next 4-6 weeks to assist with care management and care coordination needs  Completed 09/18/19 . Collaboration with pharmacy colleague, Catie Darnelle Maffucci, to discuss opportunities to assist with patient assistance programs related to cost of medication . Discussed opportunity to change medication regimen from [place] to [place] considering patient is already approved for [employer] patient assistance . Collaboration with patient primary care provider regarding medication assistance o Provided provider with [facility] medication ordering form if desired . Communication with RN Care Manager regarding interventions and plan  Completed 09/17/19 . Successful outbound call placed to the patient to assess for goal progression . Determined the patient continues to receive Humalog in the mail but is out of [place] . Patient unable to afford refills of medication . Collaboration with primary care provider to inquire if samples are available for the patient to access . Chart review performed to note needed follow up appointment to update labs . Scheduled follow up appointment with [name], FNP Monday 6/7 . Scheduled follow up call over the next two weeks  Interventions: . Patient interviewed and appropriate assessments performed . Discussed plans with patient for ongoing care management follow up and provided patient with direct contact information for care management team  . Collaboration  with embedded Pharm D [name] regarding barriers for patient to obtain medications . 05/26/19-->application RE-faxed to [employer] patient assistance application for Basaglar/Humalog--basal bolus regimen.  Patient and PCP in agreement. Patient reports FBG  90-120. o Encouraged patient to continue taking insulin as directed o 06/30/19--patient still not approved, call placed to [facility] to discuss issues; faxed paper work x2 that the company stated was incorrect . Last A1c 17.8%; patient recently admitted for DKA in 2020 . Samples of basal/bolus left for patient to pick up at PCP office today o Tresiba 25 units qHS (vial)-no basaglar in stock o Humalog 6 units with meals (vial) . All other medications refilled and called into local [facility] for affordability and patient preference. . Will continue to follow . Needs A1c check-but unable to afford labs  SW Intervention: 03/26/2019-  . Inbound call received from the patient who reports medication adherence o Fasting BS ranges 100-123 o [employer]proving o Reports mailed application back to PharmD for patient assistance o Discussed needing more insulin soon. SW instructed patient to contact providers office. SW collaborated with office regarding patient need  Patient Self Care Activities:  . Performs ADL's independently . Calls provider office for new concerns or questions  Please see past updates related to this goal by clicking on the "Past Updates" button in the selected goal         The care management team will reach out to the patient again over the next 7 days.   [name], BSW, CDP Social Worker, Certified Dementia Practitioner [place] [facility] [place] Management [phone]

## 2019-09-26 ENCOUNTER — Telehealth: Payer: Self-pay | Admitting: Nurse Practitioner

## 2019-09-26 NOTE — Chronic Care Management (AMB) (Signed)
   Care Management   Note  09/26/2019 Name: SOPHIE TAMEZ MRN: [id number] DOB: 01/25/1966  BERNADETTE GORES is a 54 y.o. year old female who is a primary care patient of [name], [place]. ANETH SCHLAGEL is currently enrolled in care management services. An additional referral for Pharm D was placed.   Follow up plan: Telephone appointment with care management team member scheduled for:10/24/2019  Glenna Durand, [place] Management ??[email or web address] ??[phone]

## 2019-10-01 ENCOUNTER — Ambulatory Visit: Payer: Self-pay

## 2019-10-01 ENCOUNTER — Telehealth: Payer: Self-pay

## 2019-10-01 DIAGNOSIS — E1165 Type 2 diabetes mellitus with hyperglycemia: Secondary | ICD-10-CM

## 2019-10-01 DIAGNOSIS — I119 Hypertensive heart disease without heart failure: Secondary | ICD-10-CM

## 2019-10-01 NOTE — Chronic Care Management (AMB) (Signed)
  Chronic Care Management   Outreach Note  10/01/2019 Name: Jessica Cruz MRN: [id number] DOB: [birth year]  Referred by: [name], FNP Reason for referral : Care Coordination   SW placed an unsuccessful outbound call to the patient to assess progression of patient stated goal. SW left a HIPAA compliant voice message requesting a return call.  Follow Up Plan: The care management team will reach out to the patient again over the next 3 days.   [name], BSW, CDP Social Worker, Certified Dementia Practitioner [place] / [place] Management [phone]

## 2019-10-02 ENCOUNTER — Telehealth: Payer: Self-pay

## 2019-10-02 ENCOUNTER — Ambulatory Visit: Payer: Self-pay

## 2019-10-02 DIAGNOSIS — E1165 Type 2 diabetes mellitus with hyperglycemia: Secondary | ICD-10-CM

## 2019-10-02 NOTE — Chronic Care Management (AMB) (Signed)
  Chronic Care Management   Outreach Note  10/02/2019 Name: Jessica Cruz MRN: [id number] DOB: July 09, 1965  Referred by: [name], FNP Reason for referral : Care Coordination   Second unsuccessful outbound call placed to the patient to assist with ongoing care management and care coordination needs. SW left HIPAA compliant voice message requesting a return call.  Follow Up Plan: The care management team will reach out to the patient again over the next 10 days.   [name], BSW, CDP Social Worker, Certified Dementia Practitioner [place] / [place] Management [phone]

## 2019-10-13 ENCOUNTER — Telehealth: Payer: Self-pay

## 2019-10-13 ENCOUNTER — Ambulatory Visit: Payer: Self-pay

## 2019-10-13 DIAGNOSIS — E1165 Type 2 diabetes mellitus with hyperglycemia: Secondary | ICD-10-CM

## 2019-10-13 NOTE — Chronic Care Management (AMB) (Signed)
  Chronic Care Management   Outreach Note  10/13/2019 Name: Jessica Cruz MRN: [id number] DOB: [birth year]  Referred by: [name], FNP Reason for referral : Care Coordination   SW placed a third unsuccessful outbound call to the patient to assist with ongoing care management and care coordination needs. SW left a HIPAA compliant voice message requesting a return call.  Follow Up Plan: SW will attempt a fourth and final outreach over the next 3 weeks.  [name], BSW, CDP Social Worker, Certified Dementia Practitioner [place] / [place] Management [phone]

## 2019-10-23 ENCOUNTER — Telehealth: Payer: Self-pay | Admitting: Nurse Practitioner

## 2019-10-23 NOTE — Chronic Care Management (AMB) (Signed)
  Care Management   Note  10/23/2019 Name: [name] MRN: [id number] DOB: February 02, 1966  Jessica Cruz is a 54 y.o. year old female who is a primary care patient of [name], [place] and is actively engaged with the care management team. I reached out to [name] by phone today to assist with re-scheduling an initial visit with the Pharmacist.  Follow up plan: Unsuccessful telephone outreach attempt made. A HIPPA compliant phone message was left for the patient providing contact information and requesting a return call. The care management team will reach out to the patient again over the next 7 days. If patient returns call to provider office, please advise to call [place] at [phone].  [place], [place] [address] Direct Dial: [phone] [email or web address]: [email or web address]

## 2019-10-23 NOTE — Chronic Care Management (AMB) (Signed)
  Care Management   Note  10/23/2019 Name: TOSHIA LARKIN MRN: [id number] DOB: February 06, 1966  SAM WUNSCHEL is a 54 y.o. year old female who is a primary care patient of [name], [place] and is actively engaged with the care management team. I reached out to [name] by phone today to assist with re-scheduling an initial visit with the Pharmacist.  Follow up plan: Telephone appointment with care management team member scheduled for: 11/24/2019  [place], [place] Management  [place], [place] [address] Direct Dial: [email or web address] Website: [email or web address]

## 2019-10-24 ENCOUNTER — Telehealth: Payer: Self-pay

## 2019-10-30 ENCOUNTER — Ambulatory Visit: Payer: Self-pay

## 2019-10-30 ENCOUNTER — Telehealth: Payer: Self-pay

## 2019-10-30 DIAGNOSIS — E1165 Type 2 diabetes mellitus with hyperglycemia: Secondary | ICD-10-CM

## 2019-10-30 NOTE — Chronic Care Management (AMB) (Signed)
  Chronic Care Management   Outreach Note  10/30/2019 Name: Jessica Cruz MRN: [id number] DOB: 28-Feb-1966  Referred by: [name], FNP Reason for referral : [place] unsuccessful telephone outreach was attempted today. The patient was referred to the case management team for assistance with care management and care coordination. The patient's primary care provider has been notified of SW unsuccessful attempts to make or maintain contact with the patient. SW is pleased to engage with this patient at any time in the future should she be interested in assistance.  Follow Up Plan: No SW follow up planned at this time. The patient will remain active with embedded PharmD.   [name], BSW, CDP Social Worker, Certified Dementia Practitioner [place] / [place] Management [phone]

## 2019-11-19 ENCOUNTER — Other Ambulatory Visit: Payer: Self-pay | Admitting: Nurse Practitioner

## 2019-11-19 DIAGNOSIS — I119 Hypertensive heart disease without heart failure: Secondary | ICD-10-CM

## 2019-11-21 ENCOUNTER — Telehealth: Payer: Self-pay

## 2019-11-21 NOTE — Chronic Care Management (AMB) (Signed)
Chronic Care Management Pharmacy Assistant   Name: Jessica Cruz  MRN: [id number] DOB: 1965-07-01  Reason for Encounter: Medication Review/Initial Questions for CCM visit on 11/24/2019.  Called patient 11/21/2019 for initial questions, Left message to return call.   11/24/2019- Follow up on [place] patient assistance with [employer].  PCP : [name], FNP  Allergies:   Allergies  Allergen Reactions  . Pineapple Itching and Swelling    TONGUE AND THROAT AFFECTED  . Shellfish Allergy Itching and Swelling    THROAT AND TONGUE ARE AFFECTED    Medications: Outpatient Encounter Medications as of 11/21/2019  Medication Sig  . acetaminophen (TYLENOL) 650 MG CR tablet Take 650 mg by mouth 2 (two) times daily as needed for pain.  [name] Kitchen aspirin 325 MG tablet Take 1 tablet (325 mg total) by mouth daily.  [name] Kitchen atorvastatin (LIPITOR) 40 MG tablet Take 1 tablet (40 mg total) by mouth daily.  . blood glucose meter kit and supplies KIT Dispense based on patient and insurance preference. Use up to four times daily as directed. (FOR ICD-9 250.00, 250.01).  . carvedilol (COREG) 12.5 MG tablet Take 1 tablet (12.5 mg total) by mouth 2 (two) times daily with a meal.  . gabapentin (NEURONTIN) 300 MG capsule Take 1 tablet by mouth two times a day  . hydrALAZINE (APRESOLINE) 50 MG tablet Take 1 tablet (50 mg total) by mouth every 8 (eight) hours.  . Insulin Glargine (BASAGLAR KWIKPEN) 100 UNIT/ML SOPN Inject 25 Units into the skin at bedtime.  . insulin lispro (HUMALOG) 100 UNIT/ML injection Inject 6 Units into the skin 3 (three) times daily before meals.  . Insulin Syringe-Needle U-100 (INSULIN SYRINGE .5CC/30GX1/2") 30G X 1/2" 0.5 ML MISC As needed  . losartan (COZAAR) 50 MG tablet Take 1 tablet by mouth once daily  . pantoprazole (PROTONIX) 40 MG tablet Take 1 tablet (40 mg total) by mouth daily.   No facility-administered encounter medications on file as of 11/21/2019.    Current Diagnosis: Patient  Active Problem List   Diagnosis Date Noted  . DKA (diabetic ketoacidoses) ([place]) 01/28/2019  . DKA, type 2 ([place]) 01/27/2019  . Hyperkalemia 01/27/2019  . AKI (acute kidney injury) ([place]) 01/27/2019  . Weight loss 01/27/2019  . Vaginal discharge 04/05/2018  . Abnormal glucose 04/05/2018  . Controlled type 2 diabetes mellitus with hyperglycemia ([place]) 01/02/2018  . Anxiety 01/02/2018  . TIA (transient ischemic attack) 10/08/2016  . Expressive aphasia 10/08/2016  . Right hemiparesis ([place]) 10/08/2016  . Chest discomfort 10/08/2016  . Hypertensive urgency 10/08/2016  . History of cerebral aneurysm repair 10/08/2016  . Pre-diabetes   . Obesity   . Hypertensive heart disease   . Microcytic anemia   . Liver nodule   . Essential hypertension   . Hyperlipidemia   . Iron deficiency anemia due to chronic blood loss   . ACS (acute coronary syndrome) ([place]) 02/16/2016  . Hypertension, uncontrolled 02/16/2016  . Hypertensive emergency   . Hypoxemia   . Chest pain   . NSTEMI (non-ST elevated myocardial infarction) [facility])      Follow-Up:  Patient Assistance Coordination- Patient has an initial visit with Jannette Fogo, CPP 11/24/2019, following up on [place] patient assistance. Called [place], spoke with Kennyth Lose, she informed me that patient received last assistance medication on 10/03/2019 for 120 day supply. Patient due for refill 01/31/2020 and has 5 refills left on order. We will need to call in October to start refill order for patient. Jannette Fogo,  CPP notified.   Pattricia Boss, [place] Pharmacist Assistant [phone]

## 2019-11-24 ENCOUNTER — Telehealth: Payer: Self-pay

## 2019-11-24 NOTE — Chronic Care Management (AMB) (Deleted)
Chronic Care Management Pharmacy  Name: Jessica Cruz  MRN: [id number] DOB: 1965/06/06  Chief Complaint/ HPI  Jessica Cruz,  54 y.o. , female presents for their Initial CCM visit with the clinical pharmacist via telephone due to COVID-19 Pandemic.  PCP : [name], FNP  Their chronic conditions include: Hypertension, Hyperlipidemia and Diabetes  Office Visits: 09/22/19 OV: Pt has been out of carvedilol and hydralazine for a month. BP uncontrolled. On Tresiba 25 units daily and Humalog. Reports compliance with insulin. Getting through patient assistance. Reported blood sugar between 89-119.   Consult Visits: 01/27/19 ED admission: Diabetic ketoacidosis  CCM Encounters: 09/25/19 SW: Referred to PharmD for patient education surrounding administration and disease specific education. Discussed plan to switch from [place] to [employer]. Confirmed pt approved for Basaglar through [employer]  06/30/19 PharmD: Pt still not approved for Basaglar through [employer], call placed to discuss issues. Company states paperwork was incorrect. Provided samples for pt to pick up at office today  Medications: Outpatient Encounter Medications as of 11/24/2019  Medication Sig  . acetaminophen (TYLENOL) 650 MG CR tablet Take 650 mg by mouth 2 (two) times daily as needed for pain.  [name] Kitchen aspirin 325 MG tablet Take 1 tablet (325 mg total) by mouth daily.  [name] Kitchen atorvastatin (LIPITOR) 40 MG tablet Take 1 tablet (40 mg total) by mouth daily.  . blood glucose meter kit and supplies KIT Dispense based on patient and insurance preference. Use up to four times daily as directed. (FOR ICD-9 250.00, 250.01).  . carvedilol (COREG) 12.5 MG tablet Take 1 tablet (12.5 mg total) by mouth 2 (two) times daily with a meal.  . gabapentin (NEURONTIN) 300 MG capsule Take 1 tablet by mouth two times a day  . hydrALAZINE (APRESOLINE) 50 MG tablet Take 1 tablet (50 mg total) by mouth every 8 (eight) hours.  . Insulin Glargine (BASAGLAR  KWIKPEN) 100 UNIT/ML SOPN Inject 25 Units into the skin at bedtime.  . insulin lispro (HUMALOG) 100 UNIT/ML injection Inject 6 Units into the skin 3 (three) times daily before meals.  . Insulin Syringe-Needle U-100 (INSULIN SYRINGE .5CC/30GX1/2") 30G X 1/2" 0.5 ML MISC As needed  . losartan (COZAAR) 50 MG tablet Take 1 tablet by mouth once daily  . pantoprazole (PROTONIX) 40 MG tablet Take 1 tablet (40 mg total) by mouth daily.   No facility-administered encounter medications on file as of 11/24/2019.    Current Diagnosis/Assessment:    Goals Addressed   None     Diabetes   A1c goal <7%  Recent Relevant Labs: Lab Results  Component Value Date/Time   HGBA1C 17.3 (H) 01/28/2019 02:48 AM   HGBA1C 8.0 (H) 09/12/2018 12:02 PM    Last diabetic Eye exam: October 2020 per PCP note  Last diabetic Foot exam: No results found for: HMDIABFOOTEX   Checking BG: {CHL HP Blood Glucose Monitoring [phone]}  Recent FBG Readings: *** Recent pre-meal BG readings:  Recent 2hr PP BG readings:   Recent HS BG readings:   Patient has failed these meds in past: Levemir, Metformin, Novolog Patient is currently controlled on the following medications: . Basaglar 25 units at bedtime . Humalog 6 units three times daily before meals . Aspirin 32m daily  We discussed:  .   Plan Continue {CHL HP Upstream Pharmacy [phone]}   Hyperlipidemia   LDL goal < 70  Lipid Panel     Component Value Date/Time   CHOL 238 (H) 09/12/2018 1202   TRIG 311 (H)  09/12/2018 1202   HDL 41 09/12/2018 1202   LDLCALC 135 (H) 09/12/2018 1202    Hepatic Function Latest Ref Rng & Units 01/27/2019 09/12/2018 10/08/2016  Total Protein 6.5 - 8.1 g/dL 6.6 7.2 7.1  Albumin 3.5 - 5.0 g/dL 3.2(L) 3.9 3.4(L)  AST 15 - 41 U/L 47(H) 21 20  ALT 0 - 44 U/L _0 Alk Phosphatase 38 - 126 U/L 84 89 55  Total Bilirubin 0.3 - 1.2 mg/dL 1.2 0.3 0.7     The ASCVD Risk score ([place]., et al., 2013)  failed to calculate for the following reasons:   The patient has a prior MI or stroke diagnosis   Patient has failed these meds in past: *** Patient is currently {CHL Controlled/[phone]} on the following medications:  . Atorvastatin 22m daily  We discussed:   .   Plan Continue {CHL HP Upstream Pharmacy [phone]}   Hypertension   BP goal is:  {CHL HP UPSTREAM Pharmacist BP ranges:[phone]}  Office blood pressures are  BP Readings from Last 3 Encounters:  09/22/19 (!) 160/100  02/11/19 (!) 129/92  01/29/19 (!) 136/109   Patient checks BP at home {CHL HP BP Monitoring Frequency:[phone]} Patient home BP readings are ranging: ***  Patient has failed these meds in the past: N/A Patient is currently {CHL Controlled/[phone]} on the following medications:  . Carvedilol 6.261mtwice daily . Hydralazine 5052mvery 8 hours . Losartan 15m18mily  We discussed: .   Plan Continue {CHL HP Upstream Pharmacy [phone]}   Neuropathy   Patient has failed these meds in past: N/A Patient is currently {CHL Controlled/[phone]} on the following medications:   Gabapentin 300mg2mce daily  We discussed:  {CHL HP Upstream Pharmacy discussion:[phone]}  Plan Continue {CHL HP Upstream Pharmacy [phone]}  GERD   Patient has failed these meds in past: N/A Patient is currently {CHL Controlled/[phone]} on the following medications:   Pantoprazole 40 mg daily  We discussed:  {CHL HP Upstream Pharmacy discussion:[phone]}  Plan Continue {CHL HP Upstream Pharmacy PlansVXBOZ:[phone]}cines   Reviewed and discussed patient's vaccination history.     There is no immunization history on file for this patient.  Plan Recommended patient receive *** vaccine in *** office/pharmacy.   Medication Management   Pt uses [facility] for all medications Uses pill box? {Yes or If no, why  not?:20788} Pt endorses ***% compliance  We discussed: *** . Importance of taking each medications daily as directed  Plan {US Pharmacy Plan:BHEB:78375}ollow up: *** month phone visit  CourtJannette FogormD Clinical Pharmacist [facility] [phone]

## 2019-12-08 ENCOUNTER — Telehealth: Payer: Self-pay | Admitting: *Deleted

## 2019-12-08 NOTE — Chronic Care Management (AMB) (Signed)
  Care Management   Note  12/08/2019 Name: Jessica Cruz MRN: [id number] DOB: 06/04/1965  Jessica Cruz is a 54 y.o. year old female who is a primary care patient of [name], [place] and is actively engaged with the care management team. I reached out to [name] by phone today to assist with re-scheduling a follow up visit with the Pharmacist.  Follow up plan: Unsuccessful telephone outreach attempt made. A HIPPA compliant phone message was left for the patient providing contact information and requesting a return call.  The care management team will reach out to the patient again over the next 7 days.  If patient returns call to provider office, please advise to call [place]  at [phone].  [place] Management

## 2019-12-15 NOTE — Chronic Care Management (AMB) (Signed)
  Care Management   Note  12/15/2019 Name: [name] MRN: [id number] DOB: 1965/11/19  Jessica Cruz is a 54 y.o. year old female who is a primary care patient of [name], [place] and is actively engaged with the care management team. I reached out to [name] by phone today to assist with re-scheduling a follow up visit with the Pharmacist.  Follow up plan: A second unsuccessful telephone outreach attempt made. A HIPPA compliant phone message was left for the patient providing contact information and requesting a return call. The care management team will reach out to the patient again over the next 7 days. If patient returns call to provider office, please advise to call [place] at [phone].  [place] Management

## 2019-12-24 NOTE — Chronic Care Management (AMB) (Signed)
  Care Management   Note  12/24/2019 Name: Jessica Cruz MRN: [id number] DOB: 1965-05-12  [name] is a 54 y.o. year old female who is a primary care patient of [name], [place] and is actively engaged with the care management team. I reached out to [name] by phone today to assist with re-scheduling a follow up visit with the Pharmacist.  Follow up plan: Unsuccessful telephone outreach attempt made.Unable to make contact on outreach attempts x 3. PCP [name], FNP, and Pharmacist Jannette Fogo notified via routed documentation in medical record. We have been unable to make contact with the patient for follow up. The care management team is available to follow up with the patient after provider conversation with the patient regarding recommendation for care management engagement and subsequent re-referral to the care management team.   [place] Management

## 2019-12-29 ENCOUNTER — Ambulatory Visit: Payer: Self-pay | Admitting: Nurse Practitioner

## 2020-01-08 ENCOUNTER — Telehealth: Payer: Self-pay

## 2020-01-08 ENCOUNTER — Encounter: Payer: Self-pay | Admitting: Nurse Practitioner

## 2020-01-08 ENCOUNTER — Telehealth (INDEPENDENT_AMBULATORY_CARE_PROVIDER_SITE_OTHER): Payer: Self-pay | Admitting: Nurse Practitioner

## 2020-01-08 ENCOUNTER — Other Ambulatory Visit: Payer: Self-pay

## 2020-01-08 DIAGNOSIS — R0602 Shortness of breath: Secondary | ICD-10-CM

## 2020-01-08 DIAGNOSIS — U071 COVID-19: Secondary | ICD-10-CM

## 2020-01-08 MED ORDER — ALBUTEROL SULFATE HFA 108 (90 BASE) MCG/ACT IN AERS: 2.0000 | INHALATION_SPRAY | Freq: Four times a day (QID) | RESPIRATORY_TRACT | 2 refills | Status: DC | PRN

## 2020-01-08 NOTE — Telephone Encounter (Signed)
PT RCVD POSITIVE COVID-19 TEST. VIRTUAL VISIT SCHEDULED. PT CONSENT TO VIRTUAL

## 2020-01-08 NOTE — Progress Notes (Signed)
Virtual Visit via My Chart    This visit type was conducted due to national recommendations for restrictions regarding the COVID-19 Pandemic (e.g. social distancing) in an effort to limit this patient's exposure and mitigate transmission in our community.  Due to her co-morbid illnesses, this patient is at least at moderate risk for complications without adequate follow up.  This format is felt to be most appropriate for this patient at this time.  All issues noted in this document were discussed and addressed.  A limited physical exam was performed with this format.    This visit type was conducted due to national recommendations for restrictions regarding the COVID-19 Pandemic (e.g. social distancing) in an effort to limit this patient's exposure and mitigate transmission in our community.  Patients identity confirmed using two different identifiers.  This format is felt to be most appropriate for this patient at this time.  All issues noted in this document were discussed and addressed.  No physical exam was performed (except for noted visual exam findings with Video Visits).    Date:  01/08/2020   ID:  Jessica Cruz, DOB 01/15/1966, MRN [id number]  Patient Location:  Home - spoke with Jessica Cruz  Provider location:   Office    Chief Complaint:    History of Present Illness:    Jessica Cruz is a 54 y.o. female who presents via video conferencing for a telehealth visit today.    The patient does have symptoms concerning for COVID-19 infection (fever, chills, cough, or new shortness of breath).   She is having a virtual visit due to being positive with covid - her symptoms began on Sunday night - sore throat, tickling in the throat, chills and body ache. Recently lost sense of taste and smell.  She is having shortness of breath - worse sometimes more than others. She has been up all day and all night due to not being able to breath. She was diagnosed Wednesday.  She has been taking  nyquil.  She is unsure who she was in contact.    She has not been vaccinated.  She missed her last appt due to being exposed to covid.      Past Medical History:  Diagnosis Date  . Anxiety   . Brain aneurysm    a. h/o intracranial aneurysm rupture (clipped [facility] 2007).  . Essential hypertension   . Fibroids   . Hyperlipidemia   . Hypertensive heart disease   . Liver nodule   . Menorrhagia   . Microcytic anemia   . Non-ST elevation myocardial infarction (NSTEMI), type 2    a. demand ischemia 01/2016 in setting of accelerated HTN, anemia - normal cors by cath.  . Obesity   . Pre-diabetes   . Stroke [facility])    Past Surgical History:  Procedure Laterality Date  . CARDIAC CATHETERIZATION N/A 02/16/2016   Procedure: Left Heart Cath and Coronary Angiography;  Surgeon: Wellington Hampshire, MD;  Location: [facility];  Service: Cardiovascular;  Laterality: N/A;  . CEREBRAL ANEURYSM REPAIR       No outpatient medications have been marked as taking for the 01/08/20 encounter (Appointment) with [name], [place].     Allergies:   Pineapple and Shellfish allergy   Social History   Tobacco Use  . Smoking status: Never Smoker  . Smokeless tobacco: Never Used  Vaping Use  . Vaping Use: Never used  Substance Use Topics  . Alcohol use: Yes  Comment: rare  . Drug use: No     Family Hx: The patient's family history includes Diabetes in her mother; Heart attack in her paternal grandfather and paternal grandmother; Heart attack (age of onset: 49) in her father; Heart disease in her maternal grandmother, paternal grandfather, and paternal grandmother; Heart failure in her paternal grandfather and paternal grandmother; Hypertension in her mother; Stroke in her father, maternal grandfather, and paternal grandfather.  ROS:   Please see the history of present illness.    Review of Systems  Constitutional: Negative.   Cardiovascular: Negative for chest pain and palpitations.    Neurological: Negative.   Psychiatric/Behavioral: Negative.     All other systems reviewed and are negative.   Labs/Other Tests and Data Reviewed:    Recent Labs: 01/27/2019: ALT 24; Magnesium 1.9; TSH 3.407 01/28/2019: BUN 9; Creatinine, Ser 0.78; Hemoglobin 11.9; Platelets 229; Potassium 4.3; Sodium 131   Recent Lipid Panel Lab Results  Component Value Date/Time   CHOL 238 (H) 09/12/2018 12:02 PM   TRIG 311 (H) 09/12/2018 12:02 PM   HDL 41 09/12/2018 12:02 PM   CHOLHDL 5.8 (H) 09/12/2018 12:02 PM   CHOLHDL 7.1 10/08/2016 04:03 AM   LDLCALC 135 (H) 09/12/2018 12:02 PM    Wt Readings from Last 3 Encounters:  09/22/19 205 lb 9.6 oz (93.3 kg)  02/11/19 213 lb (96.6 kg)  01/27/19 213 lb (96.6 kg)     Exam:    Vital Signs:  There were no vitals taken for this visit.    Physical Exam Vitals reviewed.  Constitutional:      Appearance: Normal appearance.  Cardiovascular:     Rate and Rhythm: Normal rate and regular rhythm.     Pulses: Normal pulses.     Heart sounds: Normal heart sounds. No murmur heard.   Pulmonary:     Effort: Pulmonary effort is normal. No respiratory distress.     Breath sounds: Normal breath sounds.  Neurological:     General: No focal deficit present.     Mental Status: She is alert and oriented to person, place, and time.     Cranial Nerves: No cranial nerve deficit.  Psychiatric:        Mood and Affect: Mood normal.        Behavior: Behavior normal.        Thought Content: Thought content normal.        Judgment: Judgment normal.     ASSESSMENT & PLAN:    1. COVID-19 virus infection  Diagnosed yesterday and her symptoms started on Monday 9/20 - she needs to remain out of work for 10 days.   I will also have [facility] to make a visit at home - albuterol (VENTOLIN HFA) 108 (90 Base) MCG/ACT inhaler; Inhale 2 puffs into the lungs every 6 (six) hours as needed for wheezing or shortness of breath.  Dispense: 8 g; Refill: 2  2.  Shortness of breath  With her previous history of cardiac problems and uncontrolled type 2 diabetes I would like for her to be evaluated at the ER for further evaluation. She is already taking aspirin 325 mg daily. - albuterol (VENTOLIN HFA) 108 (90 Base) MCG/ACT inhaler; Inhale 2 puffs into the lungs every 6 (six) hours as needed for wheezing or shortness of breath.  Dispense: 8 g; Refill: 2   COVID-19 Education: The signs and symptoms of COVID-19 were discussed with the patient and how to seek care for testing (follow up with PCP or  arrange E-visit).  The importance of social distancing was discussed today.  Patient Risk:   After full review of this patients clinical status, I feel that they are at least moderate risk at this time.  Time:   Today, I have spent 12 minutes/ seconds with the patient with telehealth technology discussing above diagnoses.     Medication Adjustments/Labs and Tests Ordered: Current medicines are reviewed at length with the patient today.  Concerns regarding medicines are outlined above.   Tests Ordered: No orders of the defined types were placed in this encounter.   Medication Changes: No orders of the defined types were placed in this encounter.   Disposition:  Follow up prn  Signed, [name], FNP

## 2020-01-13 ENCOUNTER — Other Ambulatory Visit: Payer: Self-pay | Admitting: Nurse Practitioner

## 2020-01-14 ENCOUNTER — Telehealth: Payer: Self-pay

## 2020-01-14 NOTE — Telephone Encounter (Signed)
Cindy with [facility] called stating they have been trying to reach pt but haven't been able too. I called pt today and she stated they came to the home to see her today. YL,RMA

## 2020-01-17 ENCOUNTER — Other Ambulatory Visit
Admission: RE | Admit: 2020-01-17 | Discharge: 2020-01-17 | Disposition: A | Discharge status: Home / Self Care | Payer: Self-pay | Source: Ambulatory Visit | Attending: Nurse Practitioner | Admitting: Nurse Practitioner

## 2020-01-17 DIAGNOSIS — E1165 Type 2 diabetes mellitus with hyperglycemia: Secondary | ICD-10-CM | POA: Insufficient documentation

## 2020-01-17 DIAGNOSIS — B665 Fasciolopsiasis: Secondary | ICD-10-CM | POA: Insufficient documentation

## 2020-01-17 DIAGNOSIS — U071 COVID-19: Secondary | ICD-10-CM | POA: Insufficient documentation

## 2020-01-17 DIAGNOSIS — I249 Acute ischemic heart disease, unspecified: Secondary | ICD-10-CM | POA: Insufficient documentation

## 2020-01-18 LAB — CBC WITH DIFFERENTIAL/PLATELET
Abs Immature Granulocytes: 0.06 10*3/uL (ref 0.00–0.07)
Basophils Absolute: 0 10*3/uL (ref 0.0–0.1)
Basophils Relative: 0 %
Eosinophils Absolute: 0 10*3/uL (ref 0.0–0.5)
Eosinophils Relative: 0 %
HCT: 27.8 % — ABNORMAL LOW (ref 36.0–46.0)
Hemoglobin: 7.5 g/dL — ABNORMAL LOW (ref 12.0–15.0)
Immature Granulocytes: 1 %
Lymphocytes Relative: 20 %
Lymphs Abs: 1.6 10*3/uL (ref 0.7–4.0)
MCH: 16.7 pg — ABNORMAL LOW (ref 26.0–34.0)
MCHC: 27 g/dL — ABNORMAL LOW (ref 30.0–36.0)
MCV: 62.1 fL — ABNORMAL LOW (ref 80.0–100.0)
Monocytes Absolute: 0.5 10*3/uL (ref 0.1–1.0)
Monocytes Relative: 7 %
Neutro Abs: 6 10*3/uL (ref 1.7–7.7)
Neutrophils Relative %: 72 %
Platelets: 537 10*3/uL — ABNORMAL HIGH (ref 150–400)
RBC: 4.48 MIL/uL (ref 3.87–5.11)
RDW: 25.4 % — ABNORMAL HIGH (ref 11.5–15.5)
WBC: 8.2 10*3/uL (ref 4.0–10.5)
nRBC: 0 % (ref 0.0–0.2)

## 2020-01-18 LAB — FIBRIN DERIVATIVES D-DIMER (ARMC ONLY): Fibrin derivatives D-dimer (ARMC): 961.73 ng/mL (FEU) — ABNORMAL HIGH (ref 0.00–499.00)

## 2020-01-18 LAB — COMPREHENSIVE METABOLIC PANEL
ALT: 22 U/L (ref 0–44)
AST: 23 U/L (ref 15–41)
Albumin: 3.1 g/dL — ABNORMAL LOW (ref 3.5–5.0)
Alkaline Phosphatase: 44 U/L (ref 38–126)
Anion gap: 8 (ref 5–15)
BUN: 20 mg/dL (ref 6–20)
CO2: 25 mmol/L (ref 22–32)
Calcium: 8.5 mg/dL — ABNORMAL LOW (ref 8.9–10.3)
Chloride: 105 mmol/L (ref 98–111)
Creatinine, Ser: 0.78 mg/dL (ref 0.44–1.00)
GFR calc Af Amer: >60 mL/min (ref >60)
GFR calc non Af Amer: >60 mL/min (ref >60)
Glucose, Bld: 89 mg/dL (ref 70–99)
Potassium: 5.5 mmol/L — ABNORMAL HIGH (ref 3.5–5.1)
Sodium: 138 mmol/L (ref 135–145)
Total Bilirubin: 0.5 mg/dL (ref 0.3–1.2)
Total Protein: 6.9 g/dL (ref 6.5–8.1)

## 2020-01-18 LAB — C-REACTIVE PROTEIN: CRP: 5.2 mg/dL — ABNORMAL HIGH (ref <1.0)

## 2020-04-07 ENCOUNTER — Telehealth: Payer: Self-pay

## 2020-04-07 NOTE — Chronic Care Management (AMB) (Signed)
    Chronic Care Management Pharmacy Assistant   Name: Jessica Cruz  MRN: [id number] DOB: 09-27-1965  Reason for Encounter: Medication Review   PCP : [name], FNP  Allergies:   Allergies  Allergen Reactions  . Pineapple Itching and Swelling    TONGUE AND THROAT AFFECTED  . Shellfish Allergy Itching and Swelling    THROAT AND TONGUE ARE AFFECTED    Medications: Outpatient Encounter Medications as of 04/07/2020  Medication Sig  . acetaminophen (TYLENOL) 650 MG CR tablet Take 650 mg by mouth 2 (two) times daily as needed for pain.  [name] Kitchen albuterol (VENTOLIN HFA) 108 (90 Base) MCG/ACT inhaler Inhale 2 puffs into the lungs every 6 (six) hours as needed for wheezing or shortness of breath.  [name] Kitchen aspirin 325 MG tablet Take 1 tablet (325 mg total) by mouth daily.  [name] Kitchen atorvastatin (LIPITOR) 40 MG tablet Take 1 tablet (40 mg total) by mouth daily.  . blood glucose meter kit and supplies KIT Dispense based on patient and insurance preference. Use up to four times daily as directed. (FOR ICD-9 250.00, 250.01).  . carvedilol (COREG) 12.5 MG tablet Take 1 tablet (12.5 mg total) by mouth 2 (two) times daily with a meal.  . gabapentin (NEURONTIN) 300 MG capsule Take 1 tablet by mouth two times a day  . hydrALAZINE (APRESOLINE) 50 MG tablet Take 1 tablet (50 mg total) by mouth every 8 (eight) hours.  . Insulin Glargine (BASAGLAR KWIKPEN) 100 UNIT/ML SOPN Inject 25 Units into the skin at bedtime.  . insulin lispro (HUMALOG) 100 UNIT/ML injection Inject 6 Units into the skin 3 (three) times daily before meals.  . Insulin Syringe-Needle U-100 (INSULIN SYRINGE .5CC/30GX1/2") 30G X 1/2" 0.5 ML MISC As needed  . losartan (COZAAR) 50 MG tablet Take 1 tablet by mouth once daily  . pantoprazole (PROTONIX) 40 MG tablet Take 1 tablet (40 mg total) by mouth daily.   No facility-administered encounter medications on file as of 04/07/2020.    Current Diagnosis: Patient Active Problem List   Diagnosis Date  Noted  . DKA (diabetic ketoacidoses) 01/28/2019  . DKA, type 2 ([place]) 01/27/2019  . Hyperkalemia 01/27/2019  . AKI (acute kidney injury) ([place]) 01/27/2019  . Weight loss 01/27/2019  . Vaginal discharge 04/05/2018  . Abnormal glucose 04/05/2018  . Controlled type 2 diabetes mellitus with hyperglycemia ([place]) 01/02/2018  . Anxiety 01/02/2018  . TIA (transient ischemic attack) 10/08/2016  . Expressive aphasia 10/08/2016  . Right hemiparesis ([place]) 10/08/2016  . Chest discomfort 10/08/2016  . Hypertensive urgency 10/08/2016  . History of cerebral aneurysm repair 10/08/2016  . Pre-diabetes   . Obesity   . Hypertensive heart disease   . Microcytic anemia   . Liver nodule   . Essential hypertension   . Hyperlipidemia   . Iron deficiency anemia due to chronic blood loss   . ACS (acute coronary syndrome) ([place]) 02/16/2016  . Hypertension, uncontrolled 02/16/2016  . Hypertensive emergency   . Hypoxemia   . Chest pain   . NSTEMI (non-ST elevated myocardial infarction) [facility])       Follow-Up:  Patient Assistance Coordination - New patient assistance form filled out to [employer] for [employer] . Waiting for provider and patient's signature . Called patient to see when she can come to PCP office to bring proof of income and sign application.  Unable to leave message.  [name], CPP. Notified  [name], [place] Pharmacist Assistant [phone]

## 2020-04-20 ENCOUNTER — Telehealth: Payer: Self-pay

## 2020-04-20 NOTE — Chronic Care Management (AMB) (Signed)
Chronic Care Management Pharmacy Assistant   Name: Jessica Cruz  MRN: [id number] DOB: 11-01-1965  Reason for Encounter: Patient Assistance Coordination    PCP : [name], FNP  Allergies:   Allergies  Allergen Reactions   Pineapple Itching and Swelling    TONGUE AND THROAT AFFECTED   Shellfish Allergy Itching and Swelling    THROAT AND TONGUE ARE AFFECTED    Medications: Outpatient Encounter Medications as of 04/20/2020  Medication Sig   acetaminophen (TYLENOL) 650 MG CR tablet Take 650 mg by mouth 2 (two) times daily as needed for pain.   albuterol (VENTOLIN HFA) 108 (90 Base) MCG/ACT inhaler Inhale 2 puffs into the lungs every 6 (six) hours as needed for wheezing or shortness of breath.   aspirin 325 MG tablet Take 1 tablet (325 mg total) by mouth daily.   atorvastatin (LIPITOR) 40 MG tablet Take 1 tablet (40 mg total) by mouth daily.   blood glucose meter kit and supplies KIT Dispense based on patient and insurance preference. Use up to four times daily as directed. (FOR ICD-9 250.00, 250.01).   carvedilol (COREG) 12.5 MG tablet Take 1 tablet (12.5 mg total) by mouth 2 (two) times daily with a meal.   gabapentin (NEURONTIN) 300 MG capsule Take 1 tablet by mouth two times a day   hydrALAZINE (APRESOLINE) 50 MG tablet Take 1 tablet (50 mg total) by mouth every 8 (eight) hours.   Insulin Glargine (BASAGLAR KWIKPEN) 100 UNIT/ML SOPN Inject 25 Units into the skin at bedtime.   insulin lispro (HUMALOG) 100 UNIT/ML injection Inject 6 Units into the skin 3 (three) times daily before meals.   Insulin Syringe-Needle U-100 (INSULIN SYRINGE .5CC/30GX1/2") 30G X 1/2" 0.5 ML MISC As needed   losartan (COZAAR) 50 MG tablet Take 1 tablet by mouth once daily   pantoprazole (PROTONIX) 40 MG tablet Take 1 tablet (40 mg total) by mouth daily.   No facility-administered encounter medications on file as of 04/20/2020.    Current Diagnosis: Patient Active Problem List    Diagnosis Date Noted   DKA (diabetic ketoacidoses) 01/28/2019   DKA, type 2 ([place]) 01/27/2019   Hyperkalemia 01/27/2019   AKI (acute kidney injury) ([place]) 01/27/2019   Weight loss 01/27/2019   Vaginal discharge 04/05/2018   Abnormal glucose 04/05/2018   Controlled type 2 diabetes mellitus with hyperglycemia ([place]) 01/02/2018   Anxiety 01/02/2018   TIA (transient ischemic attack) 10/08/2016   Expressive aphasia 10/08/2016   Right hemiparesis ([place]) 10/08/2016   Chest discomfort 10/08/2016   Hypertensive urgency 10/08/2016   History of cerebral aneurysm repair 10/08/2016   Pre-diabetes    Obesity    Hypertensive heart disease    Microcytic anemia    Liver nodule    Essential hypertension    Hyperlipidemia    Iron deficiency anemia due to chronic blood loss    ACS (acute coronary syndrome) ([place]) 02/16/2016   Hypertension, uncontrolled 02/16/2016   Hypertensive emergency    Hypoxemia    Chest pain    NSTEMI (non-ST elevated myocardial infarction) ([place])     Goals Addressed   None    04/21/19--Attempted to outreach the patient to see if she can come into PCP office to bring in proof of income and sign patient assistance application for [employer]. No answer; left a HIPAA compliant voicemail for a call back.  Follow-Up:  Patient Assistance Coordination-Patient confirmed she will come into the office on Thursday 04/23/19 to sign but can't provide proof  income at this time because she is not working right now and is recovering from Covid.   ° °Notified Vallie Pearson, CPP.  ° ° °Candice Thomas, CMA °Clinical Pharmacist Assistant °[phone] °

## 2020-04-29 ENCOUNTER — Other Ambulatory Visit: Payer: Self-pay

## 2020-04-29 ENCOUNTER — Ambulatory Visit: Payer: Self-pay | Admitting: Nurse Practitioner

## 2020-04-29 ENCOUNTER — Encounter: Payer: Self-pay | Admitting: Nurse Practitioner

## 2020-04-29 VITALS — BP 128/82 | HR 86 | Temp 98.3°F | Ht 62.4 in | Wt 206.0 lb

## 2020-04-29 DIAGNOSIS — I119 Hypertensive heart disease without heart failure: Secondary | ICD-10-CM

## 2020-04-29 DIAGNOSIS — R202 Paresthesia of skin: Secondary | ICD-10-CM

## 2020-04-29 DIAGNOSIS — I10 Essential (primary) hypertension: Secondary | ICD-10-CM

## 2020-04-29 DIAGNOSIS — G2581 Restless legs syndrome: Secondary | ICD-10-CM

## 2020-04-29 DIAGNOSIS — Z2821 Immunization not carried out because of patient refusal: Secondary | ICD-10-CM

## 2020-04-29 DIAGNOSIS — E1165 Type 2 diabetes mellitus with hyperglycemia: Secondary | ICD-10-CM

## 2020-04-29 DIAGNOSIS — E785 Hyperlipidemia, unspecified: Secondary | ICD-10-CM

## 2020-04-29 DIAGNOSIS — G629 Polyneuropathy, unspecified: Secondary | ICD-10-CM

## 2020-04-29 MED ORDER — GVOKE HYPOPEN 1-PACK 1 MG/0.2ML ~~LOC~~ SOAJ: 1.0000 mg | SUBCUTANEOUS | 3 refills | Status: DC | PRN

## 2020-04-29 MED ORDER — LOSARTAN POTASSIUM 50 MG PO TABS: 50.0000 mg | ORAL_TABLET | Freq: Every day | ORAL | 0 refills | Status: DC

## 2020-04-29 MED ORDER — ATORVASTATIN CALCIUM 40 MG PO TABS: 40.0000 mg | ORAL_TABLET | Freq: Every day | ORAL | 0 refills | Status: DC

## 2020-04-29 MED ORDER — HYDRALAZINE HCL 50 MG PO TABS: 50.0000 mg | ORAL_TABLET | Freq: Three times a day (TID) | ORAL | 0 refills | Status: DC

## 2020-04-29 MED ORDER — CARVEDILOL 12.5 MG PO TABS: 12.5000 mg | ORAL_TABLET | Freq: Two times a day (BID) | ORAL | 0 refills | Status: DC

## 2020-04-29 MED ORDER — GABAPENTIN 300 MG PO CAPS: ORAL_CAPSULE | ORAL | 0 refills | Status: DC

## 2020-04-29 NOTE — Patient Instructions (Signed)
Diabetes Mellitus and Exercise Exercising regularly is important for overall health, especially for people who have diabetes mellitus. Exercising is not only about losing weight. It has many other health benefits, such as increasing muscle strength and bone density and reducing body fat and stress. This leads to improved fitness, flexibility, and endurance, all of which result in better overall health. What are the benefits of exercise if I have diabetes? Exercise has many benefits for people with diabetes. They include:  Helping to lower and control blood sugar (glucose).  Helping the body to respond better to the hormone insulin by improving insulin sensitivity.  Reducing how much insulin the body needs.  Lowering the risk for heart disease by: ? Lowering "bad" cholesterol and triglyceride levels. ? Increasing "good" cholesterol levels. ? Lowering blood pressure. ? Lowering blood glucose levels. What is my activity plan? Your health care provider or certified diabetes educator can help you make a plan for the type and frequency of exercise that works for you. This is called your activity plan. Be sure to:  Get at least 150 minutes of medium-intensity or high-intensity exercise each week. Exercises may include brisk walking, biking, or water aerobics.  Do stretching and strengthening exercises, such as yoga or weight lifting, at least 2 times a week.  Spread out your activity over at least 3 days of the week.  Get some form of physical activity each day. ? Do not go more than 2 days in a row without some kind of physical activity. ? Avoid being inactive for more than 90 minutes at a time. Take frequent breaks to walk or stretch.  Choose exercises or activities that you enjoy. Set realistic goals.  Start slowly and gradually increase your exercise intensity over time.   How do I manage my diabetes during exercise? Monitor your blood glucose  Check your blood glucose before and  after exercising. If your blood glucose is: ? 240 mg/dL (13.3 mmol/L) or higher before you exercise, check your urine for ketones. These are chemicals created by the liver. If you have ketones in your urine, do not exercise until your blood glucose returns to normal. ? 100 mg/dL (5.6 mmol/L) or lower, eat a snack containing 15-20 grams of carbohydrate. Check your blood glucose 15 minutes after the snack to make sure that your glucose level is above 100 mg/dL (5.6 mmol/L) before you start your exercise.  Know the symptoms of low blood glucose (hypoglycemia) and how to treat it. Your risk for hypoglycemia increases during and after exercise. Follow these tips and your health care provider's instructions  Keep a carbohydrate snack that is fast-acting for use before, during, and after exercise to help prevent or treat hypoglycemia.  Avoid injecting insulin into areas of the body that are going to be exercised. For example, avoid injecting insulin into: ? Your arms, when you are about to play tennis. ? Your legs, when you are about to go jogging.  Keep records of your exercise habits. Doing this can help you and your health care provider adjust your diabetes management plan as needed. Write down: ? Food that you eat before and after you exercise. ? Blood glucose levels before and after you exercise. ? The type and amount of exercise you have done.  Work with your health care provider when you start a new exercise or activity. He or she may need to: ? Make sure that the activity is safe for you. ? Adjust your insulin, other medicines, and food that   you eat.  Drink plenty of water while you exercise. This prevents loss of water (dehydration) and problems caused by a lot of heat in the body (heat stroke).   Where to find more information  American Diabetes Association: www.diabetes.org Summary  Exercising regularly is important for overall health, especially for people who have diabetes  mellitus.  Exercising has many health benefits. It increases muscle strength and bone density and reduces body fat and stress. It also lowers and controls blood glucose.  Your health care provider or certified diabetes educator can help you make an activity plan for the type and frequency of exercise that works for you.  Work with your health care provider to make sure any new activity is safe for you. Also work with your health care provider to adjust your insulin, other medicines, and the food you eat. This information is not intended to replace advice given to you by your health care provider. Make sure you discuss any questions you have with your health care provider. Document Revised: 12/30/2018 Document Reviewed: 12/30/2018 Elsevier Patient Education  2021 Elsevier Inc. Hypertension, Adult Hypertension is another name for high blood pressure. High blood pressure forces your heart to work harder to pump blood. This can cause problems over time. There are two numbers in a blood pressure reading. There is a top number (systolic) over a bottom number (diastolic). It is best to have a blood pressure that is below 120/80. Healthy choices can help lower your blood pressure, or you may need medicine to help lower it. What are the causes? The cause of this condition is not known. Some conditions may be related to high blood pressure. What increases the risk?  Smoking.  Having type 2 diabetes mellitus, high cholesterol, or both.  Not getting enough exercise or physical activity.  Being overweight.  Having too much fat, sugar, calories, or salt (sodium) in your diet.  Drinking too much alcohol.  Having long-term (chronic) kidney disease.  Having a family history of high blood pressure.  Age. Risk increases with age.  Race. You may be at higher risk if you are African American.  Gender. Men are at higher risk than women before age 45. After age 65, women are at higher risk than  men.  Having obstructive sleep apnea.  Stress. What are the signs or symptoms?  High blood pressure may not cause symptoms. Very high blood pressure (hypertensive crisis) may cause: ? Headache. ? Feelings of worry or nervousness (anxiety). ? Shortness of breath. ? Nosebleed. ? A feeling of being sick to your stomach (nausea). ? Throwing up (vomiting). ? Changes in how you see. ? Very bad chest pain. ? Seizures. How is this treated?  This condition is treated by making healthy lifestyle changes, such as: ? Eating healthy foods. ? Exercising more. ? Drinking less alcohol.  Your health care provider may prescribe medicine if lifestyle changes are not enough to get your blood pressure under control, and if: ? Your top number is above 130. ? Your bottom number is above 80.  Your personal target blood pressure may vary. Follow these instructions at home: Eating and drinking  If told, follow the DASH eating plan. To follow this plan: ? Fill one half of your plate at each meal with fruits and vegetables. ? Fill one fourth of your plate at each meal with whole grains. Whole grains include whole-wheat pasta, brown rice, and whole-grain bread. ? Eat or drink low-fat dairy products, such as skim milk or   low-fat yogurt. ? Fill one fourth of your plate at each meal with low-fat (lean) proteins. Low-fat proteins include fish, chicken without skin, eggs, beans, and tofu. ? Avoid fatty meat, cured and processed meat, or chicken with skin. ? Avoid pre-made or processed food.  Eat less than 1,500 mg of salt each day.  Do not drink alcohol if: ? Your doctor tells you not to drink. ? You are pregnant, may be pregnant, or are planning to become pregnant.  If you drink alcohol: ? Limit how much you use to:  0-1 drink a day for women.  0-2 drinks a day for men. ? Be aware of how much alcohol is in your drink. In the [place] one drink equals one 12 oz bottle of beer (355 mL), one 5 oz glass  of wine (148 mL), or one 1 oz glass of hard liquor (44 mL).   Lifestyle  Work with your doctor to stay at a healthy weight or to lose weight. Ask your doctor what the best weight is for you.  Get at least 30 minutes of exercise most days of the week. This may include walking, swimming, or biking.  Get at least 30 minutes of exercise that strengthens your muscles (resistance exercise) at least 3 days a week. This may include lifting weights or doing Pilates.  Do not use any products that contain nicotine or tobacco, such as cigarettes, e-cigarettes, and chewing tobacco. If you need help quitting, ask your doctor.  Check your blood pressure at home as told by your doctor.  Keep all follow-up visits as told by your doctor. This is important.   Medicines  Take over-the-counter and prescription medicines only as told by your doctor. Follow directions carefully.  Do not skip doses of blood pressure medicine. The medicine does not work as well if you skip doses. Skipping doses also puts you at risk for problems.  Ask your doctor about side effects or reactions to medicines that you should watch for. Contact a doctor if you:  Think you are having a reaction to the medicine you are taking.  Have headaches that keep coming back (recurring).  Feel dizzy.  Have swelling in your ankles.  Have trouble with your vision. Get help right away if you:  Get a very bad headache.  Start to feel mixed up (confused).  Feel weak or numb.  Feel faint.  Have very bad pain in your: ? Chest. ? Belly (abdomen).  Throw up more than once.  Have trouble breathing. Summary  Hypertension is another name for high blood pressure.  High blood pressure forces your heart to work harder to pump blood.  For most people, a normal blood pressure is less than 120/80.  Making healthy choices can help lower blood pressure. If your blood pressure does not get lower with healthy choices, you may need to  take medicine. This information is not intended to replace advice given to you by your health care provider. Make sure you discuss any questions you have with your health care provider. Document Revised: 12/12/2017 Document Reviewed: 12/12/2017 Elsevier Patient Education  2021 Elsevier Inc.  

## 2020-04-29 NOTE — Progress Notes (Signed)
Rutherford Nail as a scribe for [name], FNP.,have documented all relevant documentation on the behalf of [name], FNP,as directed by  [name], FNP while in the presence of [name], [place].   This visit occurred during the SARS-CoV-2 public health emergency.  Safety protocols were in place, including screening questions prior to the visit, additional usage of staff PPE, and extensive cleaning of exam room while observing appropriate contact time as indicated for disinfecting solutions.  Subjective:     Patient ID: Jessica Cruz , female    DOB: [birth year] , 55 y.o.   MRN: [id number]   Chief Complaint  Patient presents with  . Diabetes  . Hypertension    HPI  Pt here today for diabetes and hypertension f/u. She still has not been to see her cardiologist due to no insurance. She has not made an appt with her eye doctor.  She is still challenged with not having any health insurane  Diabetes She presents for her follow-up diabetic visit. She has type 2 diabetes mellitus. Her disease course has been fluctuating. Pertinent negatives for hypoglycemia include no dizziness, headaches, nervousness/anxiousness or sleepiness. Pertinent negatives for diabetes include no chest pain, no fatigue, no polydipsia, no polyphagia and no polyuria. Pertinent negatives for hypoglycemia complications include no nocturnal hypoglycemia. Risk factors for coronary artery disease include obesity. Current diabetic treatment includes oral agent (dual therapy) (12 units of humalog with meals and basaglar 25 units at night). She has not had a previous visit with a dietitian. She rarely participates in exercise. (She reports sometimes she will have a low blood sugar in the mornings.  ) An ACE inhibitor/angiotensin II receptor blocker is being taken. She does not see a podiatrist.Eye exam is not current.     Past Medical History:  Diagnosis Date  . Anxiety   . Brain aneurysm    a. h/o intracranial  aneurysm rupture (clipped [facility] 2007).  . Essential hypertension   . Fibroids   . Hyperlipidemia   . Hypertensive heart disease   . Liver nodule   . Menorrhagia   . Microcytic anemia   . Non-ST elevation myocardial infarction (NSTEMI), type 2    a. demand ischemia 01/2016 in setting of accelerated HTN, anemia - normal cors by cath.  . Obesity   . Pre-diabetes   . Stroke [facility])      Family History  Problem Relation Age of Onset  . Heart attack Father 70  . Stroke Father   . Diabetes Mother   . Hypertension Mother   . Heart disease Maternal Grandmother   . Stroke Maternal Grandfather   . Heart attack Paternal Grandmother   . Heart disease Paternal Grandmother   . Heart failure Paternal Grandmother   . Heart failure Paternal Grandfather   . Heart disease Paternal Grandfather   . Heart attack Paternal Grandfather   . Stroke Paternal Grandfather      Current Outpatient Medications:  .  acetaminophen (TYLENOL) 650 MG CR tablet, Take 650 mg by mouth 2 (two) times daily as needed for pain., Disp: , Rfl:  .  aspirin 325 MG tablet, Take 1 tablet (325 mg total) by mouth daily., Disp: 30 tablet, Rfl: 0 .  blood glucose meter kit and supplies KIT, Dispense based on patient and insurance preference. Use up to four times daily as directed. (FOR ICD-9 250.00, 250.01)., Disp: 1 each, Rfl: 0 .  Glucagon (GVOKE HYPOPEN 1-PACK) 1 MG/0.2ML SOAJ, Inject 1 mg into the skin as  needed., Disp: 0.2 mL, Rfl: 3 .  Insulin Glargine (BASAGLAR KWIKPEN) 100 UNIT/ML SOPN, Inject 25 Units into the skin at bedtime., Disp: , Rfl:  .  Insulin Syringe-Needle U-100 (INSULIN SYRINGE .5CC/30GX1/2") 30G X 1/2" 0.5 ML MISC, As needed, Disp: 100 each, Rfl: 0 .  metFORMIN (GLUCOPHAGE) 500 MG tablet, Take 1 tablet (500 mg total) by mouth 2 (two) times daily with a meal., Disp: 180 tablet, Rfl: 1 .  albuterol (VENTOLIN HFA) 108 (90 Base) MCG/ACT inhaler, Inhale 2 puffs into the lungs every 6 (six) hours as needed for wheezing  or shortness of breath. (Patient not taking: Reported on 04/29/2020), Disp: 8 g, Rfl: 2 .  atorvastatin (LIPITOR) 40 MG tablet, Take 1 tablet (40 mg total) by mouth daily., Disp: 90 tablet, Rfl: 0 .  carvedilol (COREG) 12.5 MG tablet, Take 1 tablet (12.5 mg total) by mouth 2 (two) times daily with a meal., Disp: 180 tablet, Rfl: 0 .  gabapentin (NEURONTIN) 300 MG capsule, Take 1 tablet by mouth two times a day, Disp: 180 capsule, Rfl: 0 .  hydrALAZINE (APRESOLINE) 50 MG tablet, Take 1 tablet (50 mg total) by mouth every 8 (eight) hours., Disp: 270 tablet, Rfl: 0 .  insulin lispro (HUMALOG) 100 UNIT/ML injection, Inject 0.06 mLs (6 Units total) into the skin 3 (three) times daily before meals. PRN Sliding scale, Disp: 10 mL, Rfl:  .  losartan (COZAAR) 50 MG tablet, Take 1 tablet (50 mg total) by mouth daily., Disp: 90 tablet, Rfl: 0   Allergies  Allergen Reactions  . Pineapple Itching and Swelling    TONGUE AND THROAT AFFECTED  . Shellfish Allergy Itching and Swelling    THROAT AND TONGUE ARE AFFECTED     Review of Systems  Constitutional: Negative.  Negative for fatigue.  HENT: Negative.   Respiratory: Negative.  Negative for cough.   Cardiovascular: Negative.  Negative for chest pain, palpitations and leg swelling.  Endocrine: Negative for polydipsia, polyphagia and polyuria.  Musculoskeletal: Negative.   Skin: Negative.   Neurological: Negative for dizziness and headaches.  Psychiatric/Behavioral: Negative.  The patient is not nervous/anxious.      Today's Vitals   04/29/20 1619  BP: 128/82  Pulse: 86  Temp: 98.3 F (36.8 C)  TempSrc: Oral  Weight: 206 lb (93.4 kg)  Height: 5' 2.4" (1.585 m)   Body mass index is 37.2 kg/m.  Wt Readings from Last 3 Encounters:  04/29/20 206 lb (93.4 kg)  09/22/19 205 lb 9.6 oz (93.3 kg)  02/11/19 213 lb (96.6 kg)   Objective:  Physical Exam Constitutional:      General: She is not in acute distress.    Appearance: Normal appearance.  She is obese.  Cardiovascular:     Rate and Rhythm: Normal rate and regular rhythm.     Pulses: Normal pulses.     Heart sounds: Normal heart sounds. No murmur heard.   Pulmonary:     Effort: Pulmonary effort is normal. No respiratory distress.     Breath sounds: Normal breath sounds. No wheezing.  Skin:    Capillary Refill: Capillary refill takes less than 2 seconds.  Neurological:     General: No focal deficit present.     Mental Status: She is alert and oriented to person, place, and time.     Cranial Nerves: No cranial nerve deficit.  Psychiatric:        Mood and Affect: Mood normal.        Behavior: Behavior normal.  Thought Content: Thought content normal.        Judgment: Judgment normal.         Assessment And Plan:     1. Essential hypertension . B/P is well controlled.  . CMP ordered to check renal function.  . The importance of regular exercise and dietary modification was stressed to the patient.  - CMP14+EGFR  2. Uncontrolled type 2 diabetes mellitus with hyperglycemia (HCC)  Chronic, she has not had her HgbA1c checked in almost a year, they were unable to get her blood last visit and she did not return due to being sick   She has been having some low blood sugars will send rx for gvoke  Will check HgbA1c she may need some adjusting on her insulins  I have stressed with her the importance of adhering to care and not missing appts and to have consistent follow up - Glucagon (GVOKE HYPOPEN 1-PACK) 1 MG/0.2ML SOAJ; Inject 1 mg into the skin as needed.  Dispense: 0.2 mL; Refill: 3 - Hemoglobin A1c - gabapentin (NEURONTIN) 300 MG capsule; Take 1 tablet by mouth two times a day  Dispense: 180 capsule; Refill: 0  3. Hypertensive heart disease without heart failure  Chronic, she has not follow up with cardiology as I have stressed the importance of doing this with her significant heart history.  She has been taking 2 of each of her blood pressure  medications from when she had remote health visiting her, which has been effective.   Will try to clarify from remote health - carvedilol (COREG) 12.5 MG tablet; Take 1 tablet (12.5 mg total) by mouth 2 (two) times daily with a meal.  Dispense: 180 tablet; Refill: 0 - atorvastatin (LIPITOR) 40 MG tablet; Take 1 tablet (40 mg total) by mouth daily.  Dispense: 90 tablet; Refill: 0 - losartan (COZAAR) 50 MG tablet; Take 1 tablet (50 mg total) by mouth daily.  Dispense: 90 tablet; Refill: 0 - hydrALAZINE (APRESOLINE) 50 MG tablet; Take 1 tablet (50 mg total) by mouth every 8 (eight) hours.  Dispense: 270 tablet; Refill: 0  4. Hyperlipidemia, unspecified hyperlipidemia type Chronic, stable Continue with current medications - CMP14+EGFR - Lipid panel  5. Neuropathy  Chronic, stable. - gabapentin (NEURONTIN) 300 MG capsule; Take 1 tablet by mouth two times a day  Dispense: 180 capsule; Refill: 0  6. Tingling in extremities Chronic, stable Continue with gabapentin - gabapentin (NEURONTIN) 300 MG capsule; Take 1 tablet by mouth two times a day  Dispense: 180 capsule; Refill: 0  7. Restless leg - gabapentin (NEURONTIN) 300 MG capsule; Take 1 tablet by mouth two times a day  Dispense: 180 capsule; Refill: 0  8. COVID-19 vaccination declined Declines covid 19 vaccine. Discussed risk of covid 25 and if she changes her mind about the vaccine to call the office.  Encouraged to take multivitamin, vitamin d, vitamin c and zinc to increase immune system. Aware can call office if would like to have vaccine here at office.     Patient was given opportunity to ask questions. Patient verbalized understanding of the plan and was able to repeat key elements of the plan. All questions were answered to their satisfaction.  [name], FNP   I, [name], FNP, have reviewed all documentation for this visit. The documentation on 04/29/20 for the exam, diagnosis, procedures, and orders are all accurate and  complete.    THE PATIENT IS ENCOURAGED TO PRACTICE SOCIAL DISTANCING DUE TO THE COVID-19 PANDEMIC.

## 2020-04-30 LAB — LIPID PANEL
Chol/HDL Ratio: 5.3 ratio — ABNORMAL HIGH (ref 0.0–4.4)
Cholesterol, Total: 239 mg/dL — ABNORMAL HIGH (ref 100–199)
HDL: 45 mg/dL (ref >39)
LDL Chol Calc (NIH): 174 mg/dL — ABNORMAL HIGH (ref 0–99)
Triglycerides: 110 mg/dL (ref 0–149)
VLDL Cholesterol Cal: 20 mg/dL (ref 5–40)

## 2020-04-30 LAB — CMP14+EGFR
ALT: 12 IU/L (ref 0–32)
AST: 17 IU/L (ref 0–40)
Albumin/Globulin Ratio: 1.3 (ref 1.2–2.2)
Albumin: 4.3 g/dL (ref 3.8–4.9)
Alkaline Phosphatase: 65 IU/L (ref 44–121)
BUN/Creatinine Ratio: 16 (ref 9–23)
BUN: 14 mg/dL (ref 6–24)
Bilirubin Total: 0.3 mg/dL (ref 0.0–1.2)
CO2: 22 mmol/L (ref 20–29)
Calcium: 9.5 mg/dL (ref 8.7–10.2)
Chloride: 107 mmol/L — ABNORMAL HIGH (ref 96–106)
Creatinine, Ser: 0.87 mg/dL (ref 0.57–1.00)
GFR calc Af Amer: 89 mL/min/{1.73_m2} (ref >59)
GFR calc non Af Amer: 77 mL/min/{1.73_m2} (ref >59)
Globulin, Total: 3.2 g/dL (ref 1.5–4.5)
Glucose: 101 mg/dL — ABNORMAL HIGH (ref 65–99)
Potassium: 4.5 mmol/L (ref 3.5–5.2)
Sodium: 144 mmol/L (ref 134–144)
Total Protein: 7.5 g/dL (ref 6.0–8.5)

## 2020-04-30 LAB — HEMOGLOBIN A1C
Est. average glucose Bld gHb Est-mCnc: 117 mg/dL
Hgb A1c MFr Bld: 5.7 % — ABNORMAL HIGH (ref 4.8–5.6)

## 2020-04-30 MED ORDER — METFORMIN HCL 500 MG PO TABS: 500.0000 mg | ORAL_TABLET | Freq: Two times a day (BID) | ORAL | 1 refills | Status: DC

## 2020-04-30 MED ORDER — INSULIN LISPRO 100 UNIT/ML ~~LOC~~ SOLN: 6.0000 [IU] | Freq: Three times a day (TID) | SUBCUTANEOUS | Status: DC

## 2020-05-01 ENCOUNTER — Encounter: Payer: Self-pay | Admitting: Nurse Practitioner

## 2020-05-05 ENCOUNTER — Ambulatory Visit: Payer: Self-pay

## 2020-05-07 ENCOUNTER — Telehealth: Payer: Self-pay

## 2020-05-07 ENCOUNTER — Ambulatory Visit: Payer: Self-pay

## 2020-05-07 NOTE — Chronic Care Management (AMB) (Signed)
Chronic Care Management Pharmacy Assistant   Name: Jessica Cruz  MRN: [id number] DOB: [birth year]  Reason for Encounter: Patient Assistance Coordination  05/07/20-Attempted to outreach the patient regarding [employer] patient assistance application. No answer; left a voicemail for a call back.  05/17/20-Unsuccessful telephone outreach to the patient regarding [employer] patient assistance application. Left a message with a call back number to discuss.  PCP : [name], FNP  Allergies:   Allergies  Allergen Reactions   Pineapple Itching and Swelling    TONGUE AND THROAT AFFECTED   Shellfish Allergy Itching and Swelling    THROAT AND TONGUE ARE AFFECTED    Medications: Outpatient Encounter Medications as of 05/07/2020  Medication Sig   acetaminophen (TYLENOL) 650 MG CR tablet Take 650 mg by mouth 2 (two) times daily as needed for pain.   albuterol (VENTOLIN HFA) 108 (90 Base) MCG/ACT inhaler Inhale 2 puffs into the lungs every 6 (six) hours as needed for wheezing or shortness of breath. (Patient not taking: Reported on 04/29/2020)   aspirin 325 MG tablet Take 1 tablet (325 mg total) by mouth daily.   atorvastatin (LIPITOR) 40 MG tablet Take 1 tablet (40 mg total) by mouth daily.   blood glucose meter kit and supplies KIT Dispense based on patient and insurance preference. Use up to four times daily as directed. (FOR ICD-9 250.00, 250.01).   carvedilol (COREG) 12.5 MG tablet Take 1 tablet (12.5 mg total) by mouth 2 (two) times daily with a meal.   gabapentin (NEURONTIN) 300 MG capsule Take 1 tablet by mouth two times a day   Glucagon (GVOKE HYPOPEN 1-PACK) 1 MG/0.2ML SOAJ Inject 1 mg into the skin as needed.   hydrALAZINE (APRESOLINE) 50 MG tablet Take 1 tablet (50 mg total) by mouth every 8 (eight) hours.   Insulin Glargine (BASAGLAR KWIKPEN) 100 UNIT/ML SOPN Inject 25 Units into the skin at bedtime.   insulin lispro (HUMALOG) 100 UNIT/ML injection Inject 0.06 mLs  (6 Units total) into the skin 3 (three) times daily before meals. PRN Sliding scale   Insulin Syringe-Needle U-100 (INSULIN SYRINGE .5CC/30GX1/2") 30G X 1/2" 0.5 ML MISC As needed   losartan (COZAAR) 50 MG tablet Take 1 tablet (50 mg total) by mouth daily.   metFORMIN (GLUCOPHAGE) 500 MG tablet Take 1 tablet (500 mg total) by mouth 2 (two) times daily with a meal.   No facility-administered encounter medications on file as of 05/07/2020.    Current Diagnosis: Patient Active Problem List   Diagnosis Date Noted   DKA (diabetic ketoacidoses) 01/28/2019   DKA, type 2 ([place]) 01/27/2019   Hyperkalemia 01/27/2019   AKI (acute kidney injury) ([place]) 01/27/2019   Weight loss 01/27/2019   Vaginal discharge 04/05/2018   Abnormal glucose 04/05/2018   Controlled type 2 diabetes mellitus with hyperglycemia ([place]) 01/02/2018   Anxiety 01/02/2018   TIA (transient ischemic attack) 10/08/2016   Expressive aphasia 10/08/2016   Right hemiparesis ([place]) 10/08/2016   Chest discomfort 10/08/2016   Hypertensive urgency 10/08/2016   History of cerebral aneurysm repair 10/08/2016   Pre-diabetes    Obesity    Hypertensive heart disease    Microcytic anemia    Liver nodule    Essential hypertension    Hyperlipidemia    Iron deficiency anemia due to chronic blood loss    ACS (acute coronary syndrome) ([place]) 02/16/2016   Hypertension, uncontrolled 02/16/2016   Hypertensive emergency    Hypoxemia    Chest pain    NSTEMI (  non-ST elevated myocardial infarction) [facility])      Follow-Up:  Patient Assistance Coordination-Notified [name], CPP.  Jessica Cruz, [place] Pharmacist Assistant [phone]

## 2020-06-04 ENCOUNTER — Other Ambulatory Visit: Payer: Self-pay | Admitting: Nurse Practitioner

## 2020-06-04 DIAGNOSIS — G2581 Restless legs syndrome: Secondary | ICD-10-CM

## 2020-06-04 DIAGNOSIS — E1165 Type 2 diabetes mellitus with hyperglycemia: Secondary | ICD-10-CM

## 2020-06-04 DIAGNOSIS — G629 Polyneuropathy, unspecified: Secondary | ICD-10-CM

## 2020-06-04 DIAGNOSIS — R202 Paresthesia of skin: Secondary | ICD-10-CM

## 2020-06-17 ENCOUNTER — Telehealth: Payer: Self-pay

## 2020-06-17 NOTE — Chronic Care Management (AMB) (Signed)
Chronic Care Management Pharmacy Assistant   Name: Jessica Cruz  MRN: [id number] DOB: November 14, 1965  Reason for Encounter: Diabetes Adherence Call  Patient Questions:  1.  Have you seen any other providers since your last visit? No    2.  Any changes in your medicines or health? No    PCP : [name], FNP  Allergies:   Allergies  Allergen Reactions  . Pineapple Itching and Swelling    TONGUE AND THROAT AFFECTED  . Shellfish Allergy Itching and Swelling    THROAT AND TONGUE ARE AFFECTED    Medications: Outpatient Encounter Medications as of 06/17/2020  Medication Sig  . acetaminophen (TYLENOL) 650 MG CR tablet Take 650 mg by mouth 2 (two) times daily as needed for pain.  [name] Kitchen albuterol (VENTOLIN HFA) 108 (90 Base) MCG/ACT inhaler Inhale 2 puffs into the lungs every 6 (six) hours as needed for wheezing or shortness of breath. (Patient not taking: Reported on 04/29/2020)  . aspirin 325 MG tablet Take 1 tablet (325 mg total) by mouth daily.  [name] Kitchen atorvastatin (LIPITOR) 40 MG tablet Take 1 tablet (40 mg total) by mouth daily.  . blood glucose meter kit and supplies KIT Dispense based on patient and insurance preference. Use up to four times daily as directed. (FOR ICD-9 250.00, 250.01).  . carvedilol (COREG) 12.5 MG tablet Take 1 tablet (12.5 mg total) by mouth 2 (two) times daily with a meal.  . gabapentin (NEURONTIN) 300 MG capsule Take 1 capsule by mouth twice daily  . Glucagon (GVOKE HYPOPEN 1-PACK) 1 MG/0.2ML SOAJ Inject 1 mg into the skin as needed.  . hydrALAZINE (APRESOLINE) 50 MG tablet Take 1 tablet (50 mg total) by mouth every 8 (eight) hours.  . Insulin Glargine (BASAGLAR KWIKPEN) 100 UNIT/ML SOPN Inject 25 Units into the skin at bedtime.  . insulin lispro (HUMALOG) 100 UNIT/ML injection Inject 0.06 mLs (6 Units total) into the skin 3 (three) times daily before meals. PRN Sliding scale  . Insulin Syringe-Needle U-100 (INSULIN SYRINGE .5CC/30GX1/2") 30G X 1/2" 0.5 ML MISC As  needed  . losartan (COZAAR) 50 MG tablet Take 1 tablet (50 mg total) by mouth daily.  . metFORMIN (GLUCOPHAGE) 500 MG tablet Take 1 tablet (500 mg total) by mouth 2 (two) times daily with a meal.   No facility-administered encounter medications on file as of 06/17/2020.    Current Diagnosis: Patient Active Problem List   Diagnosis Date Noted  . DKA (diabetic ketoacidoses) 01/28/2019  . DKA, type 2 ([place]) 01/27/2019  . Hyperkalemia 01/27/2019  . AKI (acute kidney injury) ([place]) 01/27/2019  . Weight loss 01/27/2019  . Vaginal discharge 04/05/2018  . Abnormal glucose 04/05/2018  . Controlled type 2 diabetes mellitus with hyperglycemia ([place]) 01/02/2018  . Anxiety 01/02/2018  . TIA (transient ischemic attack) 10/08/2016  . Expressive aphasia 10/08/2016  . Right hemiparesis ([place]) 10/08/2016  . Chest discomfort 10/08/2016  . Hypertensive urgency 10/08/2016  . History of cerebral aneurysm repair 10/08/2016  . Pre-diabetes   . Obesity   . Hypertensive heart disease   . Microcytic anemia   . Liver nodule   . Essential hypertension   . Hyperlipidemia   . Iron deficiency anemia due to chronic blood loss   . ACS (acute coronary syndrome) ([place]) 02/16/2016  . Hypertension, uncontrolled 02/16/2016  . Hypertensive emergency   . Hypoxemia   . Chest pain   . NSTEMI (non-ST elevated myocardial infarction) [facility])    Recent Relevant Labs: Lab Results  Component Value Date/Time   HGBA1C 5.7 (H) 04/29/2020 04:57 PM   HGBA1C 17.3 (H) 01/28/2019 02:48 AM    Kidney Function Lab Results  Component Value Date/Time   CREATININE 0.87 04/29/2020 04:57 PM   CREATININE 0.78 01/17/2020 01:30 PM   CREATININE 0.98 02/25/2016 09:08 AM   GFRNONAA 77 04/29/2020 04:57 PM   GFRAA 89 04/29/2020 04:57 PM    . Current antihyperglycemic regimen:   metFORMIN (GLUCOPHAGE) 500 MG tablet  insulin lispro (HUMALOG) 100 UNIT/ML injection  Glucagon (GVOKE HYPOPEN 1-PACK) 1 MG/0.2ML  Insulin Glargine (BASAGLAR  KWIKPEN) 100 UNIT/ML  . What recent interventions/DTPs have been made to improve glycemic control:  o None.  . Have there been any recent hospitalizations or ED visits since last visit with CPP? No    Adherence Review: Is the patient currently on a STATIN medication? Yes Is the patient currently on ACE/ARB medication? Yes     Follow-Up:  Pharmacist Review   [name], CPP Notified.  Raynelle Highland, [place] Pharmacist Assistant [phone] Non-CCM Total Time:33 Minutes

## 2020-07-16 ENCOUNTER — Emergency Department (HOSPITAL_COMMUNITY)
Admission: EM | Admit: 2020-07-16 | Discharge: 2020-07-16 | Disposition: A | Discharge status: Home / Self Care | Payer: Self-pay | Attending: Emergency Medicine | Admitting: Emergency Medicine

## 2020-07-16 ENCOUNTER — Other Ambulatory Visit: Payer: Self-pay

## 2020-07-16 ENCOUNTER — Encounter (HOSPITAL_COMMUNITY): Payer: Self-pay | Admitting: Emergency Medicine

## 2020-07-16 DIAGNOSIS — I509 Heart failure, unspecified: Secondary | ICD-10-CM | POA: Insufficient documentation

## 2020-07-16 DIAGNOSIS — I110 Hypertensive heart disease with heart failure: Secondary | ICD-10-CM | POA: Insufficient documentation

## 2020-07-16 DIAGNOSIS — Z7982 Long term (current) use of aspirin: Secondary | ICD-10-CM | POA: Insufficient documentation

## 2020-07-16 DIAGNOSIS — Z79899 Other long term (current) drug therapy: Secondary | ICD-10-CM | POA: Insufficient documentation

## 2020-07-16 DIAGNOSIS — S161XXA Strain of muscle, fascia and tendon at neck level, initial encounter: Secondary | ICD-10-CM | POA: Insufficient documentation

## 2020-07-16 DIAGNOSIS — Z7984 Long term (current) use of oral hypoglycemic drugs: Secondary | ICD-10-CM | POA: Insufficient documentation

## 2020-07-16 DIAGNOSIS — Z794 Long term (current) use of insulin: Secondary | ICD-10-CM | POA: Insufficient documentation

## 2020-07-16 DIAGNOSIS — Y92410 Unspecified street and highway as the place of occurrence of the external cause: Secondary | ICD-10-CM | POA: Insufficient documentation

## 2020-07-16 DIAGNOSIS — E119 Type 2 diabetes mellitus without complications: Secondary | ICD-10-CM | POA: Insufficient documentation

## 2020-07-16 DIAGNOSIS — S39012A Strain of muscle, fascia and tendon of lower back, initial encounter: Secondary | ICD-10-CM | POA: Insufficient documentation

## 2020-07-16 DIAGNOSIS — Z8673 Personal history of transient ischemic attack (TIA), and cerebral infarction without residual deficits: Secondary | ICD-10-CM | POA: Insufficient documentation

## 2020-07-16 DIAGNOSIS — M5416 Radiculopathy, lumbar region: Secondary | ICD-10-CM | POA: Insufficient documentation

## 2020-07-16 MED ORDER — LIDOCAINE 5 % EX PTCH: 1.0000 | MEDICATED_PATCH | CUTANEOUS | 0 refills | Status: DC

## 2020-07-16 MED ORDER — IBUPROFEN 800 MG PO TABS: 800.0000 mg | ORAL_TABLET | Freq: Three times a day (TID) | ORAL | 0 refills | Status: DC

## 2020-07-16 MED ORDER — CYCLOBENZAPRINE HCL 10 MG PO TABS: 10.0000 mg | ORAL_TABLET | Freq: Three times a day (TID) | ORAL | 0 refills | Status: DC | PRN
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
## 2020-07-22 ENCOUNTER — Ambulatory Visit: Payer: Self-pay | Admitting: Nurse Practitioner

## 2020-07-29 ENCOUNTER — Ambulatory Visit (INDEPENDENT_AMBULATORY_CARE_PROVIDER_SITE_OTHER): Payer: Self-pay | Admitting: Nurse Practitioner

## 2020-07-29 ENCOUNTER — Other Ambulatory Visit: Payer: Self-pay

## 2020-07-29 ENCOUNTER — Encounter: Payer: Self-pay | Admitting: Nurse Practitioner

## 2020-07-29 VITALS — BP 130/84 | HR 78 | Temp 98.0°F | Ht 64.0 in | Wt 205.8 lb

## 2020-07-29 DIAGNOSIS — Z6835 Body mass index (BMI) 35.0-35.9, adult: Secondary | ICD-10-CM

## 2020-07-29 DIAGNOSIS — M5442 Lumbago with sciatica, left side: Secondary | ICD-10-CM

## 2020-07-29 DIAGNOSIS — M542 Cervicalgia: Secondary | ICD-10-CM

## 2020-07-29 DIAGNOSIS — Z794 Long term (current) use of insulin: Secondary | ICD-10-CM

## 2020-07-29 DIAGNOSIS — E119 Type 2 diabetes mellitus without complications: Secondary | ICD-10-CM

## 2020-07-29 DIAGNOSIS — I10 Essential (primary) hypertension: Secondary | ICD-10-CM

## 2020-07-29 MED ORDER — DICLOFENAC SODIUM 1 % EX GEL: 2.0000 g | Freq: Four times a day (QID) | CUTANEOUS | 2 refills | Status: DC

## 2020-07-29 NOTE — Patient Instructions (Addendum)
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
## 2020-09-02 ENCOUNTER — Telehealth: Payer: Self-pay
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
## 2020-11-08 ENCOUNTER — Telehealth: Payer: Self-pay

## 2020-11-08 NOTE — Chronic Care Management (AMB) (Signed)
Patient is no longer enrolled in CCM services. Updated panel  [facility] Clinical Pharmacist Assistant [phone]

## 2020-12-13 ENCOUNTER — Other Ambulatory Visit: Payer: Self-pay | Admitting: Nurse Practitioner

## 2020-12-13 DIAGNOSIS — I119 Hypertensive heart disease without heart failure: Secondary | ICD-10-CM

## 2020-12-25 ENCOUNTER — Other Ambulatory Visit: Payer: Self-pay | Admitting: Nurse Practitioner

## 2020-12-25 DIAGNOSIS — E1165 Type 2 diabetes mellitus with hyperglycemia: Secondary | ICD-10-CM

## 2020-12-25 DIAGNOSIS — G2581 Restless legs syndrome: Secondary | ICD-10-CM

## 2020-12-25 DIAGNOSIS — R202 Paresthesia of skin: Secondary | ICD-10-CM

## 2020-12-25 DIAGNOSIS — G629 Polyneuropathy, unspecified: Secondary | ICD-10-CM

## 2020-12-25 DIAGNOSIS — I119 Hypertensive heart disease without heart failure: Secondary | ICD-10-CM

## 2020-12-27 MED ORDER — ATORVASTATIN CALCIUM 40 MG PO TABS: 40.0000 mg | ORAL_TABLET | Freq: Every day | ORAL | 0 refills | Status: DC

## 2020-12-27 MED ORDER — GABAPENTIN 300 MG PO CAPS: ORAL_CAPSULE | ORAL | 0 refills | Status: DC

## 2020-12-27 MED ORDER — INSULIN LISPRO 100 UNIT/ML ~~LOC~~ SOLN: 6.0000 [IU] | Freq: Three times a day (TID) | SUBCUTANEOUS | Status: DC

## 2020-12-27 MED ORDER — LOSARTAN POTASSIUM 50 MG PO TABS: 50.0000 mg | ORAL_TABLET | Freq: Every day | ORAL | 0 refills | Status: DC

## 2020-12-27 MED ORDER — CARVEDILOL 12.5 MG PO TABS: 12.5000 mg | ORAL_TABLET | Freq: Two times a day (BID) | ORAL | 0 refills | Status: DC

## 2020-12-27 MED ORDER — HYDRALAZINE HCL 50 MG PO TABS: 50.0000 mg | ORAL_TABLET | Freq: Three times a day (TID) | ORAL | 0 refills | Status: DC

## 2021-02-04 ENCOUNTER — Ambulatory Visit
Admission: EM | Admit: 2021-02-04 | Discharge: 2021-02-04 | Disposition: A | Discharge status: Home / Self Care | Payer: Self-pay

## 2021-02-04 ENCOUNTER — Other Ambulatory Visit: Payer: Self-pay

## 2021-02-04 ENCOUNTER — Encounter: Payer: Self-pay | Admitting: Nurse Practitioner

## 2021-02-04 DIAGNOSIS — E118 Type 2 diabetes mellitus with unspecified complications: Secondary | ICD-10-CM

## 2021-02-04 DIAGNOSIS — I10 Essential (primary) hypertension: Secondary | ICD-10-CM

## 2021-02-04 DIAGNOSIS — E1165 Type 2 diabetes mellitus with hyperglycemia: Secondary | ICD-10-CM

## 2021-02-04 DIAGNOSIS — Z9114 Patient's other noncompliance with medication regimen: Secondary | ICD-10-CM

## 2021-02-04 DIAGNOSIS — R0981 Nasal congestion: Secondary | ICD-10-CM

## 2021-02-04 DIAGNOSIS — Z8673 Personal history of transient ischemic attack (TIA), and cerebral infarction without residual deficits: Secondary | ICD-10-CM

## 2021-02-04 DIAGNOSIS — I519 Heart disease, unspecified: Secondary | ICD-10-CM

## 2021-02-04 HISTORY — DX: Essential (primary) hypertension: I10

## 2021-02-04 LAB — POCT FASTING CBG KUC MANUAL ENTRY: POCT Glucose (KUC): 104 mg/dL — AB (ref 70–99)

## 2021-02-04 MED ORDER — GLIPIZIDE 5 MG PO TABS: 5.0000 mg | ORAL_TABLET | Freq: Every day | ORAL | 0 refills | Status: DC

## 2021-02-04 MED ORDER — METHYLPREDNISOLONE SODIUM SUCC 125 MG IJ SOLR
125.0000 mg | Freq: Once | INTRAMUSCULAR | Status: AC
  Administered 2021-02-04: 125 mg via INTRAMUSCULAR

## 2021-02-04 MED ORDER — FLUTICASONE PROPIONATE 50 MCG/ACT NA SUSP: 2.0000 | Freq: Every day | NASAL | 2 refills | Status: DC

## 2021-02-04 NOTE — ED Provider Notes (Addendum)
[facility]    CSN: [id number] Arrival date & time: 02/04/21  1002      History   Chief Complaint Chief Complaint  Patient presents with   Nasal Congestion    HPI Jessica Cruz is a 55 y.o. female.   Patient states that since March she has been dealing with clear nasal drainage and congestion, states she is been intermittently using Afrin nasal spray to treat her congestion which used to provide her with some temporary relief, states in the past 3 days she has used it multiple times daily.  Patient has bottle with her today, patient is using a 12-hour extended release oxymetazoline spray.  Patient states that the spray is no longer working at all, has not been able to sleep for the past several nights.  Patient denies cough, headache, fever, chills, nausea, vomiting, diarrhea.  Patient states has been taking her blood pressure medicines as prescribed, reports history of stroke, heart condition, diabetes.  Patient states she recently lost 60 pounds after being diagnosed with diabetes, used to be on insulin which was discontinued when her A1c came down to below 6.  As for the past year she has has not been taking metformin because it upsets her stomach, patient states she is also been noncompliant with 90-day follow-up visits with her primary care provider.  Patient is asking to have her blood sugar checked today.  Patient was advised that we can do a fingerstick we will check A1c's at urgent care because we do not provide follow-up for diabetes.  The history is provided by the patient.   Past Medical History:  Diagnosis Date   Hypertension     There are no problems to display for this patient.   History reviewed. No pertinent surgical history.  OB History   No obstetric history on file.      Home Medications    Prior to Admission medications   Medication Sig Start Date End Date Taking? Authorizing Provider  aspirin EC 81 MG tablet Take 81 mg by mouth daily. Swallow  whole.   Yes [provider]  atorvastatin (LIPITOR) 40 MG tablet Take 40 mg by mouth daily.   Yes [provider]  carvedilol (COREG) 12.5 MG tablet Take 12.5 mg by mouth 2 (two) times daily with a meal.   Yes [provider]  fluticasone (FLONASE) 50 MCG/ACT nasal spray Place 2 sprays into both nostrils daily. 02/04/21 03/06/21 Yes Lynden Oxford Scales, PA-C  gabapentin (NEURONTIN) 300 MG capsule Take 300 mg by mouth as needed (At bedtime).   Yes [provider]  glipiZIDE (GLUCOTROL) 5 MG tablet Take 1 tablet (5 mg total) by mouth daily before breakfast. 02/04/21 03/06/21 Yes Lynden Oxford Scales, PA-C  hydrALAZINE (APRESOLINE) 50 MG tablet Take 50 mg by mouth 3 (three) times daily.   Yes [provider]  losartan (COZAAR) 50 MG tablet Take 50 mg by mouth daily.   Yes [provider]  oxymetazoline (AFRIN) 0.05 % nasal spray Place 2 sprays into both nostrils 2 (two) times daily.   Yes [provider]    Family History No family history on file.  Social History Social History   Tobacco Use   Smoking status: Never   Smokeless tobacco: Never  Vaping Use   Vaping Use: Never used  Substance Use Topics   Alcohol use: Never   Drug use: Never     Allergies   Patient has no allergy information on record.   Review  of Systems Review of Systems Pertinent findings noted in history of present illness.    Physical Exam Triage Vital Signs ED Triage Vitals  Enc Vitals Group     BP 02/04/21 1027 (!) 190/101     Pulse Rate 02/04/21 1027 73     Resp 02/04/21 1027 20     Temp 02/04/21 1027 97.8 F (36.6 C)     Temp Source 02/04/21 1027 Oral     SpO2 02/04/21 1027 98 %     Weight --      Height --      Head Circumference --      Peak Flow --      Pain Score 02/04/21 1020 0     Pain Loc --      Pain Edu? --      Excl. in [place]? --    No data found.  Updated Vital Signs BP (!) 152/100 (BP Location: Left Arm)    Pulse 73   Temp 97.8 F (36.6 C) (Oral)   Resp 20   SpO2 98%   Visual Acuity Right Eye Distance:   Left Eye Distance:   Bilateral Distance:    Right Eye Near:   Left Eye Near:    Bilateral Near:     Physical Exam Vitals and nursing note reviewed.  Constitutional:      Appearance: Normal appearance.  HENT:     Head: Normocephalic and atraumatic.     Right Ear: Hearing, tympanic membrane, ear canal and external ear normal.     Left Ear: Hearing, tympanic membrane, ear canal and external ear normal.     Nose: Mucosal edema present.     Right Nostril: Occlusion present.     Left Nostril: Occlusion present.     Right Turbinates: Enlarged and swollen.     Left Turbinates: Enlarged and swollen.     Mouth/Throat:     Mouth: Mucous membranes are moist.     Pharynx: Oropharynx is clear.  Eyes:     Extraocular Movements: Extraocular movements intact.     Conjunctiva/sclera: Conjunctivae normal.     Pupils: Pupils are equal, round, and reactive to light.  Cardiovascular:     Rate and Rhythm: Normal rate and regular rhythm.     Pulses: Normal pulses.     Heart sounds: Normal heart sounds.  Pulmonary:     Effort: Pulmonary effort is normal.     Breath sounds: Normal breath sounds.  Musculoskeletal:     Cervical back: Normal range of motion and neck supple.  Neurological:     General: No focal deficit present.     Mental Status: She is alert and oriented to person, place, and time. Mental status is at baseline.  Psychiatric:        Mood and Affect: Mood normal.        Behavior: Behavior normal.     UC Treatments / Results  Labs (all labs ordered are listed, but only abnormal results are displayed) Labs Reviewed  POCT FASTING CBG [facility] MANUAL ENTRY - Abnormal; Notable for the following components:      Result Value   POCT Glucose ([facility]) 104 (*)    All other components within normal limits    EKG   Radiology No results found.  Procedures Procedures (including  critical care time)  Medications Ordered in UC Medications  methylPREDNISolone sodium succinate (SOLU-MEDROL) 125 mg/2 mL injection 125 mg (125 mg Intramuscular Given 02/04/21 1126)    Initial Impression /  Assessment and Plan / UC Course  I have reviewed the triage vital signs and the nursing notes.  Pertinent labs & imaging results that were available during my care of the patient were reviewed by me and considered in my medical decision making (see chart for details).     Patient advised to discontinue over-the-counter nasal spray, oxymetazoline.  We will begin patient on Flonase, 2 sprays in each nare twice daily.  Patient was also provided with an injection of Solu-Medrol in the office, advised to carefully monitor her carb intake for the next 24 hours.  I also provided her with a prescription for glipizide that she can start taking to lower her blood sugar until she gets in with her provider for follow-up of her diabetes and hypertension.  Patient verbalized understanding and agreement of plan as discussed.  All questions were addressed during visit.  Please see discharge instructions below for further details of plan.  Final Clinical Impressions(s) / UC Diagnoses   Final diagnoses:  Congestion of nasal sinus  Uncontrolled type 2 diabetes mellitus with hyperglycemia, without long-term current use of insulin (HCC)  Essential hypertension  History of stroke  Type 2 diabetes mellitus with complications [facility])  Cardiac disease  Noncompliance with medication regimen   Discharge Instructions   None    ED Prescriptions     Medication Sig Dispense Auth. Provider   glipiZIDE (GLUCOTROL) 5 MG tablet Take 1 tablet (5 mg total) by mouth daily before breakfast. 30 tablet Lynden Oxford Scales, PA-C   fluticasone (FLONASE) 50 MCG/ACT nasal spray Place 2 sprays into both nostrils daily. 18 mL Lynden Oxford Scales, PA-C      PDMP not reviewed this encounter.   Lynden Oxford  Scales, PA-C 02/04/21 1108    Lynden Oxford [place], [place] 02/04/21 [phone]

## 2021-02-04 NOTE — ED Triage Notes (Addendum)
Pt reports since March she has had congestion. She states she had been using a lot of nasal spray. Patient states she has been using a lot of Afrin spray.

## 2021-03-04 ENCOUNTER — Ambulatory Visit
Admission: EM | Admit: 2021-03-04 | Discharge: 2021-03-04 | Disposition: A | Discharge status: Home / Self Care | Payer: Self-pay

## 2021-03-04 DIAGNOSIS — R0981 Nasal congestion: Secondary | ICD-10-CM

## 2021-03-04 NOTE — ED Triage Notes (Signed)
Pt reports since March she has had congestion, pt states congestion has not improved since last visit.

## 2021-03-04 NOTE — ED Provider Notes (Addendum)
[facility]    CSN: [id number] Arrival date & time: 03/04/21  1328   History   Chief Complaint Chief Complaint  Patient presents with   Nasal Congestion   HPI Jessica Cruz is a 55 y.o. female. Pt reports since March she has had congestion, pt states congestion has not improved since last visit here at the [facility] with me on February 04, 2021 at which time she was prescribed Flonase and told to follow-up with her primary care provider.   The history is provided by the patient.   Past Medical History:  Diagnosis Date   Anxiety    Brain aneurysm    a. h/o intracranial aneurysm rupture (clipped [facility] 2007).   Essential hypertension    Fibroids    Hyperlipidemia    Hypertension    Hypertensive heart disease    Liver nodule    Menorrhagia    Microcytic anemia    Non-ST elevation myocardial infarction (NSTEMI), type 2    a. demand ischemia 01/2016 in setting of accelerated HTN, anemia - normal cors by cath.   Obesity    Pre-diabetes    Stroke [facility])    Patient Active Problem List   Diagnosis Date Noted   DKA (diabetic ketoacidoses) 01/28/2019   DKA, type 2 ([place]) 01/27/2019   Hyperkalemia 01/27/2019   AKI (acute kidney injury) ([place]) 01/27/2019   Weight loss 01/27/2019   Vaginal discharge 04/05/2018   Abnormal glucose 04/05/2018   Controlled type 2 diabetes mellitus with hyperglycemia ([place]) 01/02/2018   Anxiety 01/02/2018   TIA (transient ischemic attack) 10/08/2016   Expressive aphasia 10/08/2016   Chest discomfort 10/08/2016   Hypertensive urgency 10/08/2016   History of cerebral aneurysm repair 10/08/2016   Pre-diabetes    Obesity    Hypertensive heart disease    Microcytic anemia    Liver nodule    Essential hypertension    Hyperlipidemia    Iron deficiency anemia due to chronic blood loss    ACS (acute coronary syndrome) ([place]) 02/16/2016   Hypertension, uncontrolled 02/16/2016   Hypertensive emergency    Hypoxemia    Chest pain     NSTEMI (non-ST elevated myocardial infarction) [facility])    Past Surgical History:  Procedure Laterality Date   CARDIAC CATHETERIZATION N/A 02/16/2016   Procedure: Left Heart Cath and Coronary Angiography;  Surgeon: Wellington Hampshire, MD;  Location: [facility];  Service: Cardiovascular;  Laterality: N/A;   CEREBRAL ANEURYSM REPAIR     OB History   No obstetric history on file.    Home Medications    Prior to Admission medications   Medication Sig Start Date End Date Taking? Authorizing Provider  acetaminophen (TYLENOL) 650 MG CR tablet Take 650 mg by mouth 2 (two) times daily as needed for pain.    [provider]  aspirin 325 MG tablet Take 1 tablet (325 mg total) by mouth daily. 10/10/16   Thurnell Lose, MD  aspirin EC 81 MG tablet Take 81 mg by mouth daily. Swallow whole.    [provider]  atorvastatin (LIPITOR) 40 MG tablet Take 1 tablet (40 mg total) by mouth daily. 12/27/20   [name], FNP  atorvastatin (LIPITOR) 40 MG tablet Take 40 mg by mouth daily.    [provider]  blood glucose meter kit and supplies KIT Dispense based on patient and insurance preference. Use up to four times daily as directed. (FOR ICD-9 250.00, 250.01). 01/29/19   Rowe Clack  N, MD  carvedilol (COREG) 12.5 MG tablet Take 1 tablet (12.5 mg total) by mouth 2 (two) times daily with a meal. 12/27/20   [name], FNP  carvedilol (COREG) 12.5 MG tablet Take 12.5 mg by mouth 2 (two) times daily with a meal.    [provider]  diclofenac Sodium (VOLTAREN) 1 % GEL Apply 2 g topically 4 (four) times daily. 07/29/20   [name], FNP  fluticasone (FLONASE) 50 MCG/ACT nasal spray Place 2 sprays into both nostrils daily. 02/04/21 03/06/21  Lynden Oxford Scales, PA-C  gabapentin (NEURONTIN) 300 MG capsule Take 1 capsule by mouth twice daily 12/27/20   [name], FNP  gabapentin (NEURONTIN) 300 MG capsule Take 300 mg by mouth as needed (At bedtime).     [provider]  glipiZIDE (GLUCOTROL) 5 MG tablet Take 1 tablet (5 mg total) by mouth daily before breakfast. 02/04/21 03/06/21  Lynden Oxford Scales, PA-C  hydrALAZINE (APRESOLINE) 50 MG tablet Take 1 tablet (50 mg total) by mouth every 8 (eight) hours. 12/27/20   [name], FNP  hydrALAZINE (APRESOLINE) 50 MG tablet Take 50 mg by mouth 3 (three) times daily.    [provider]  insulin lispro (HUMALOG) 100 UNIT/ML injection Inject 0.06 mLs (6 Units total) into the skin 3 (three) times daily before meals. PRN Sliding scale 12/27/20   [name], FNP  Insulin Syringe-Needle U-100 (INSULIN SYRINGE .5CC/30GX1/2") 30G X 1/2" 0.5 ML MISC As needed 01/29/19   Buriev, Arie Sabina, MD  lidocaine (LIDODERM) 5 % Place 1 patch onto the skin daily. Remove & Discard patch within 12 hours or as directed by MD 07/16/20   Montine Circle, PA-C  losartan (COZAAR) 50 MG tablet Take 1 tablet (50 mg total) by mouth daily. 12/27/20   [name], FNP  losartan (COZAAR) 50 MG tablet Take 50 mg by mouth daily.    [provider]  metFORMIN (GLUCOPHAGE) 500 MG tablet Take 1 tablet (500 mg total) by mouth 2 (two) times daily with a meal. 04/30/20 04/30/21  [name], FNP  oxymetazoline (AFRIN) 0.05 % nasal spray Place 2 sprays into both nostrils 2 (two) times daily.    [provider]   Family History Family History  Problem Relation Age of Onset   Heart attack Father 61   Stroke Father    Diabetes Mother    Hypertension Mother    Heart disease Maternal Grandmother    Stroke Maternal Grandfather    Heart attack Paternal Grandmother    Heart disease Paternal Grandmother    Heart failure Paternal Grandmother    Heart failure Paternal Grandfather    Heart disease Paternal Grandfather    Heart attack Paternal Grandfather    Stroke Paternal Grandfather    Social History Social History   Tobacco Use   Smoking status: Never   Smokeless tobacco: Never  Vaping Use    Vaping Use: Never used  Substance Use Topics   Alcohol use: Never    Comment: rare   Drug use: Never   Allergies   Bee pollen, Pineapple, and Shellfish allergy  Review of Systems Review of Systems Pertinent findings noted in history of present illness.   Physical Exam Triage Vital Signs ED Triage Vitals  Enc Vitals Group     BP 02/11/21 0827 (!) 147/82     Pulse Rate 02/11/21 0827 72     Resp 02/11/21 0827 18     Temp 02/11/21 0827 98.3 F (36.8 C)  Temp Source 02/11/21 0827 Oral     SpO2 02/11/21 0827 98 %     Weight --      Height --      Head Circumference --      Peak Flow --      Pain Score 02/11/21 0826 5     Pain Loc --      Pain Edu? --      Excl. in [place]? --    No data found.  Updated Vital Signs BP (!) 135/94 (BP Location: Left Arm)   Pulse 64   Temp 98.6 F (37 C) (Oral)   Resp 18   LMP 10/30/2018 (Approximate)   SpO2 98%   Visual Acuity Right Eye Distance:   Left Eye Distance:   Bilateral Distance:    Right Eye Near:   Left Eye Near:    Bilateral Near:     Physical Exam Vitals and nursing note reviewed.  Constitutional:      Appearance: Normal appearance.  HENT:     Head: Normocephalic and atraumatic.     Right Ear: Hearing, tympanic membrane, ear canal and external ear normal.     Left Ear: Hearing, tympanic membrane, ear canal and external ear normal.     Nose: Mucosal edema present.     Right Nostril: Occlusion present.     Left Nostril: Occlusion present.     Right Turbinates: Enlarged and swollen.     Left Turbinates: Enlarged and swollen.     Mouth/Throat:     Mouth: Mucous membranes are moist.     Pharynx: Oropharynx is clear.  Eyes:     Extraocular Movements: Extraocular movements intact.     Conjunctiva/sclera: Conjunctivae normal.     Pupils: Pupils are equal, round, and reactive to light.  Cardiovascular:     Rate and Rhythm: Normal rate and regular rhythm.     Pulses: Normal pulses.     Heart sounds: Normal heart  sounds.  Pulmonary:     Effort: Pulmonary effort is normal.     Breath sounds: Normal breath sounds.  Musculoskeletal:     Cervical back: Normal range of motion and neck supple.  Neurological:     General: No focal deficit present.     Mental Status: She is alert and oriented to person, place, and time. Mental status is at baseline.  Psychiatric:        Mood and Affect: Mood normal.        Behavior: Behavior normal.   UC Treatments / Results  Labs (all labs ordered are listed, but only abnormal results are displayed)  Labs Reviewed - No data to display  EKG  Radiology No results found.  Procedures Procedures (including critical care time)  Medications Ordered in UC Medications - No data to display  Initial Impression / Assessment and Plan / UC Course  I have reviewed the triage vital signs and the nursing notes.  Pertinent labs & imaging results that were available during my care of the patient were reviewed by me and considered in my medical decision making (see chart for details).      Vital signs are stable today.  Patient is in no acute distress.  Patient advised to follow-up with her primary care physician as advised previously.  Final Clinical Impressions(s) / UC Diagnoses   Final diagnoses:  Nasal congestion   Discharge Instructions   None    ED Prescriptions   None    PDMP not reviewed this encounter.  Lynden Oxford Scales, PA-C 03/04/21 [place], [place], [place] 03/07/21 [phone]

## 2021-03-16 ENCOUNTER — Encounter: Payer: Self-pay | Admitting: Nurse Practitioner

## 2021-03-17 ENCOUNTER — Ambulatory Visit (INDEPENDENT_AMBULATORY_CARE_PROVIDER_SITE_OTHER): Payer: Self-pay | Admitting: Nurse Practitioner

## 2021-03-17 ENCOUNTER — Encounter: Payer: Self-pay | Admitting: Nurse Practitioner

## 2021-03-17 ENCOUNTER — Other Ambulatory Visit: Payer: Self-pay

## 2021-03-17 VITALS — BP 124/80 | HR 93 | Ht 64.0 in | Wt 205.0 lb

## 2021-03-17 DIAGNOSIS — R051 Acute cough: Secondary | ICD-10-CM

## 2021-03-17 DIAGNOSIS — J0110 Acute frontal sinusitis, unspecified: Secondary | ICD-10-CM

## 2021-03-17 DIAGNOSIS — R0981 Nasal congestion: Secondary | ICD-10-CM

## 2021-03-17 DIAGNOSIS — J029 Acute pharyngitis, unspecified: Secondary | ICD-10-CM

## 2021-03-17 LAB — POCT RAPID STREP A (OFFICE): Rapid Strep A Screen: NEGATIVE

## 2021-03-17 LAB — POC COVID19 BINAXNOW: SARS Coronavirus 2 Ag: NEGATIVE

## 2021-03-17 LAB — POC INFLUENZA A&B (BINAX/QUICKVUE)
Influenza A, POC: NEGATIVE
Influenza B, POC: NEGATIVE

## 2021-03-17 MED ORDER — GUAIFENESIN-DM 100-10 MG/5ML PO SYRP: 5.0000 mL | ORAL_SOLUTION | ORAL | 0 refills | Status: DC | PRN

## 2021-03-17 MED ORDER — BENZONATATE 100 MG PO CAPS: 100.0000 mg | ORAL_CAPSULE | Freq: Three times a day (TID) | ORAL | 1 refills | Status: DC | PRN

## 2021-03-17 MED ORDER — AMOXICILLIN-POT CLAVULANATE 875-125 MG PO TABS: 1.0000 | ORAL_TABLET | Freq: Two times a day (BID) | ORAL | 0 refills | Status: AC

## 2021-03-17 NOTE — Progress Notes (Signed)
I,Jameka J Llittleton,acting as a Education administrator for [employer], NP.,have documented all relevant documentation on the behalf of [employer], NP,as directed by  Bary Castilla, NP while in the presence of Bary Castilla, NP.  This visit occurred during the SARS-CoV-2 public health emergency.  Safety protocols were in place, including screening questions prior to the visit, additional usage of staff PPE, and extensive cleaning of exam room while observing appropriate contact time as indicated for disinfecting solutions.  Subjective:     Patient ID: Jessica Cruz , female    DOB: 10-10-1965 , 55 y.o.   MRN: [id number]   Chief Complaint  Patient presents with   URI    HPI  Patient did 2 covid tests one a couple weeks ago and another one 2 days ago they were both negative.  She hs  congestion, cough, HA, sinus pain, sore throat. Denies chest pain or SOB. No fever just very congested and her sinus hurts.   URI  This is a recurrent problem. The current episode started 1 to 4 weeks ago. The problem has been gradually worsening. There has been no fever. Associated symptoms include congestion, coughing, ear pain, headaches, sinus pain and a sore throat. Pertinent negatives include no chest pain. She has tried decongestant for the symptoms. The treatment provided no relief.    Past Medical History:  Diagnosis Date   Anxiety    Brain aneurysm    a. h/o intracranial aneurysm rupture (clipped [facility] 2007).   Essential hypertension    Fibroids    Hyperlipidemia    Hypertension    Hypertensive heart disease    Liver nodule    Menorrhagia    Microcytic anemia    Non-ST elevation myocardial infarction (NSTEMI), type 2    a. demand ischemia 01/2016 in setting of accelerated HTN, anemia - normal cors by cath.   Obesity    Pre-diabetes    Stroke [facility])      Family History  Problem Relation Age of Onset   Heart attack Father 72   Stroke Father    Diabetes Mother    Hypertension Mother     Heart disease Maternal Grandmother    Stroke Maternal Grandfather    Heart attack Paternal Grandmother    Heart disease Paternal Grandmother    Heart failure Paternal Grandmother    Heart failure Paternal Grandfather    Heart disease Paternal Grandfather    Heart attack Paternal Grandfather    Stroke Paternal Grandfather      Current Outpatient Medications:    acetaminophen (TYLENOL) 650 MG CR tablet, Take 650 mg by mouth 2 (two) times daily as needed for pain., Disp: , Rfl:    amoxicillin-clavulanate (AUGMENTIN) 875-125 MG tablet, Take 1 tablet by mouth 2 (two) times daily for 7 days., Disp: 14 tablet, Rfl: 0   aspirin 325 MG tablet, Take 1 tablet (325 mg total) by mouth daily., Disp: 30 tablet, Rfl: 0   atorvastatin (LIPITOR) 40 MG tablet, Take 1 tablet (40 mg total) by mouth daily., Disp: 90 tablet, Rfl: 0   benzonatate (TESSALON PERLES) 100 MG capsule, Take 1 capsule (100 mg total) by mouth 3 (three) times daily as needed for cough., Disp: 30 capsule, Rfl: 1   blood glucose meter kit and supplies KIT, Dispense based on patient and insurance preference. Use up to four times daily as directed. (FOR ICD-9 250.00, 250.01)., Disp: 1 each, Rfl: 0   carvedilol (COREG) 12.5 MG tablet, Take 1 tablet (12.5 mg total) by  mouth 2 (two) times daily with a meal., Disp: 180 tablet, Rfl: 0   gabapentin (NEURONTIN) 300 MG capsule, Take 1 capsule by mouth twice daily, Disp: 180 capsule, Rfl: 0   glipiZIDE (GLUCOTROL) 5 MG tablet, Take 1 tablet (5 mg total) by mouth daily before breakfast., Disp: 30 tablet, Rfl: 0   guaiFENesin-dextromethorphan (ROBITUSSIN DM) 100-10 MG/5ML syrup, Take 5 mLs by mouth every 4 (four) hours as needed for cough., Disp: 118 mL, Rfl: 0   hydrALAZINE (APRESOLINE) 50 MG tablet, Take 1 tablet (50 mg total) by mouth every 8 (eight) hours., Disp: 270 tablet, Rfl: 0   Insulin Syringe-Needle U-100 (INSULIN SYRINGE .5CC/30GX1/2") 30G X 1/2" 0.5 ML MISC, As needed, Disp: 100 each, Rfl:  0   losartan (COZAAR) 50 MG tablet, Take 1 tablet (50 mg total) by mouth daily., Disp: 90 tablet, Rfl: 0   Allergies  Allergen Reactions   Shrimp (Diagnostic) Anaphylaxis   Bee Pollen    Pineapple Itching and Swelling    TONGUE AND THROAT AFFECTED   Shellfish Allergy Itching and Swelling    THROAT AND TONGUE ARE AFFECTED     Review of Systems  HENT:  Positive for congestion, ear pain, sinus pain and sore throat.   Respiratory:  Positive for cough. Negative for shortness of breath.   Cardiovascular:  Negative for chest pain and palpitations.  Musculoskeletal:  Negative for arthralgias and myalgias.  Neurological:  Positive for headaches.    Today's Vitals   03/17/21 1128  BP: 124/80  Pulse: 93  Weight: 205 lb (93 kg)  Height: '5\' 4"'  (1.626 m)  PainSc: 0-No pain   Body mass index is 35.19 kg/m.   Objective:  Physical Exam Constitutional:      Appearance: Normal appearance.  HENT:     Head: Normocephalic and atraumatic.     Right Ear: Tympanic membrane, ear canal and external ear normal. There is no impacted cerumen.     Left Ear: Tympanic membrane, ear canal and external ear normal. There is no impacted cerumen.     Nose: Congestion present.  Cardiovascular:     Rate and Rhythm: Normal rate and regular rhythm.     Pulses: Normal pulses.     Heart sounds: Normal heart sounds. No murmur heard. Pulmonary:     Effort: Pulmonary effort is normal. No respiratory distress.     Breath sounds: Normal breath sounds. No wheezing.  Skin:    General: Skin is warm and dry.     Capillary Refill: Capillary refill takes less than 2 seconds.  Neurological:     Mental Status: She is alert.        Assessment And Plan:     1. Nasal congestion - POC Influenza A&B(BINAX/QUICKVUE) - POC COVID-19 - POCT rapid strep A  2. Acute cough - POC Influenza A&B(BINAX/QUICKVUE) - POC COVID-19 - POCT rapid strep A - benzonatate (TESSALON PERLES) 100 MG capsule; Take 1 capsule (100 mg  total) by mouth 3 (three) times daily as needed for cough.  Dispense: 30 capsule; Refill: 1 - guaiFENesin-dextromethorphan (ROBITUSSIN DM) 100-10 MG/5ML syrup; Take 5 mLs by mouth every 4 (four) hours as needed for cough.  Dispense: 118 mL; Refill: 0  3. Sore throat - POC Influenza A&B(BINAX/QUICKVUE) - POC COVID-19 - POCT rapid strep A  4. Subacute frontal sinusitis - amoxicillin-clavulanate (AUGMENTIN) 875-125 MG tablet; Take 1 tablet by mouth 2 (two) times daily for 7 days.  Dispense: 14 tablet; Refill: 0    Patient was  given opportunity to ask questions. Patient verbalized understanding of the plan and was able to repeat key elements of the plan. All questions were answered to their satisfaction.  Bary Castilla, NP   I, Bary Castilla, NP, have reviewed all documentation for this visit. The documentation on 03/17/21 for the exam, diagnosis, procedures, and orders are all accurate and complete.   IF YOU HAVE BEEN REFERRED TO A SPECIALIST, IT MAY TAKE 1-2 WEEKS TO SCHEDULE/PROCESS THE REFERRAL. IF YOU HAVE NOT HEARD FROM US/SPECIALIST IN TWO WEEKS, PLEASE GIVE [place] A CALL AT [phone].   THE PATIENT IS ENCOURAGED TO PRACTICE SOCIAL DISTANCING DUE TO THE COVID-19 PANDEMIC.

## 2021-03-18 LAB — NOVEL CORONAVIRUS, NAA: SARS-CoV-2, NAA: NOT DETECTED

## 2021-03-21 ENCOUNTER — Ambulatory Visit: Payer: Self-pay | Admitting: Nurse Practitioner

## 2021-06-30 ENCOUNTER — Other Ambulatory Visit: Payer: Self-pay | Admitting: Nurse Practitioner

## 2021-08-02 ENCOUNTER — Other Ambulatory Visit: Payer: Self-pay | Admitting: Nurse Practitioner

## 2021-08-04 ENCOUNTER — Encounter (HOSPITAL_COMMUNITY): Payer: Self-pay | Admitting: Pharmacy Technician

## 2021-08-04 ENCOUNTER — Emergency Department (HOSPITAL_COMMUNITY)
Admission: EM | Admit: 2021-08-04 | Discharge: 2021-08-04 | Disposition: A | Discharge status: Home / Self Care | Payer: Self-pay | Attending: Emergency Medicine | Admitting: Emergency Medicine

## 2021-08-04 ENCOUNTER — Other Ambulatory Visit: Payer: Self-pay

## 2021-08-04 ENCOUNTER — Emergency Department (HOSPITAL_COMMUNITY): Payer: Self-pay

## 2021-08-04 DIAGNOSIS — R0981 Nasal congestion: Secondary | ICD-10-CM | POA: Insufficient documentation

## 2021-08-04 DIAGNOSIS — J3489 Other specified disorders of nose and nasal sinuses: Secondary | ICD-10-CM | POA: Insufficient documentation

## 2021-08-04 DIAGNOSIS — K1370 Unspecified lesions of oral mucosa: Secondary | ICD-10-CM | POA: Insufficient documentation

## 2021-08-04 DIAGNOSIS — Z794 Long term (current) use of insulin: Secondary | ICD-10-CM | POA: Insufficient documentation

## 2021-08-04 DIAGNOSIS — Z7982 Long term (current) use of aspirin: Secondary | ICD-10-CM | POA: Insufficient documentation

## 2021-08-04 LAB — I-STAT CHEM 8, ED
BUN: 14 mg/dL (ref 6–20)
Calcium, Ion: 1.13 mmol/L — ABNORMAL LOW (ref 1.15–1.40)
Chloride: 105 mmol/L (ref 98–111)
Creatinine, Ser: 0.8 mg/dL (ref 0.44–1.00)
Glucose, Bld: 89 mg/dL (ref 70–99)
HCT: 35 % — ABNORMAL LOW (ref 36.0–46.0)
Hemoglobin: 11.9 g/dL — ABNORMAL LOW (ref 12.0–15.0)
Potassium: 3.9 mmol/L (ref 3.5–5.1)
Sodium: 141 mmol/L (ref 135–145)
TCO2: 25 mmol/L (ref 22–32)

## 2021-08-04 MED ORDER — ACETAMINOPHEN 325 MG PO TABS
650.0000 mg | ORAL_TABLET | Freq: Once | ORAL | Status: AC
Start: 2021-08-04 — End: 2021-08-04
  Administered 2021-08-04: 650 mg via ORAL
  Filled 2021-08-04: qty 2

## 2021-08-04 NOTE — Progress Notes (Addendum)
U/S to entire Rt arm - nothing visualized for a # 20 for CT scan. Attempted access with Ultrasound in Lt FA - pt c/o pain asked to stop. Pt stated at one time she had a PICC in her left arm - visualized a vein near 2 cm depth - too deep for attempt. PA aware of inability to provide access. Noted and asked for 2nd assess.  ?

## 2021-08-04 NOTE — ED Notes (Signed)
Unsuccessful PIV attempt to right AC. Patient tolerated well. IV team consulted.  ?

## 2021-08-04 NOTE — ED Provider Notes (Signed)
?[facility] ?Provider Note ? ? ?CSN: [id number] ?Arrival date & time: 08/04/21  1149 ? ?  ? ?History ? ?No chief complaint on file. ? ? ?Jessica Cruz is a 56 y.o. female who presents to the Emergency Department complaining of rhinorrhea onset 1 year. Pt notes that she has had an issue with her sinuses since March 2022. She has been evaluated by urgent care and the emergency department for her symptoms and treated with antibiotics. She has also tried OTC cold medications, Netti-pot without relief of her symptoms. Denies shortness of breath, cough, fever, trouble swallowing. Denies sick contacts. Patient is not a current smoker.  ? ?The history is provided by the patient. No language interpreter was used.  ? ?  ? ?Home Medications ?Prior to Admission medications   ?Medication Sig Start Date End Date Taking? Authorizing Provider  ?acetaminophen (TYLENOL) 500 MG tablet Take 1,000 mg by mouth 2 (two) times daily as needed for mild pain or headache.   Yes [provider]  ?metFORMIN (GLUCOPHAGE) 500 MG tablet Take 500 mg by mouth 2 (two) times daily with a meal.   Yes [provider]  ?aspirin 325 MG tablet Take 1 tablet (325 mg total) by mouth daily. ?Patient not taking: Reported on 08/04/2021 10/10/16   Thurnell Lose, MD  ?atorvastatin (LIPITOR) 40 MG tablet Take 1 tablet (40 mg total) by mouth daily. ?Patient not taking: Reported on 08/04/2021 12/27/20   [name], FNP  ?benzonatate (TESSALON PERLES) 100 MG capsule Take 1 capsule (100 mg total) by mouth 3 (three) times daily as needed for cough. ?Patient not taking: Reported on 08/04/2021 03/17/21 03/17/22  Bary Castilla, NP  ?blood glucose meter kit and supplies KIT Dispense based on patient and insurance preference. Use up to four times daily as directed. (FOR ICD-9 250.00, 250.01). 01/29/19   Kinnie Feil, MD  ?carvedilol (COREG) 12.5 MG tablet Take 1 tablet (12.5 mg total) by mouth 2 (two) times  daily with a meal. ?Patient not taking: Reported on 08/04/2021 12/27/20   [name], FNP  ?gabapentin (NEURONTIN) 300 MG capsule Take 1 capsule by mouth twice daily ?Patient not taking: Reported on 08/04/2021 12/27/20   [name], [place]  ?glipiZIDE (GLUCOTROL) 5 MG tablet Take 1 tablet (5 mg total) by mouth daily before breakfast. ?Patient not taking: Reported on 08/04/2021 02/04/21 09/24/21  Lynden Oxford Scales, PA-C  ?guaiFENesin-dextromethorphan (ROBITUSSIN DM) 100-10 MG/5ML syrup Take 5 mLs by mouth every 4 (four) hours as needed for cough. ?Patient not taking: Reported on 08/04/2021 03/17/21   Bary Castilla, NP  ?hydrALAZINE (APRESOLINE) 50 MG tablet Take 1 tablet (50 mg total) by mouth every 8 (eight) hours. ?Patient not taking: Reported on 08/04/2021 12/27/20   [name], [place]  ?Insulin Syringe-Needle U-100 (INSULIN SYRINGE .5CC/30GX1/2") 30G X 1/2" 0.5 ML MISC As needed 01/29/19   Buriev, Arie Sabina, MD  ?losartan (COZAAR) 50 MG tablet Take 1 tablet (50 mg total) by mouth daily. ?Patient not taking: Reported on 08/04/2021 12/27/20   [name], [place]  ?   ? ?Allergies    ?Shrimp (diagnostic), Pineapple, and Shellfish allergy   ? ?Review of Systems   ?Review of Systems  ?Constitutional:  Negative for fever.  ?HENT:  Positive for congestion and rhinorrhea. Negative for trouble swallowing.   ?Respiratory:  Negative for cough and shortness of breath.   ?All other systems reviewed and are negative. ? ?Physical Exam ?Updated Vital Signs ?BP (!) 152/114 (BP Location:  Right Arm)   Pulse 95   Temp 98.7 ?F (37.1 ?C) (Oral)   Resp 18   LMP 10/30/2018 (Approximate)   SpO2 99%  ?Physical Exam ?Vitals and nursing note reviewed.  ?Constitutional:   ?   General: She is not in acute distress. ?   Appearance: She is not diaphoretic.  ?HENT:  ?   Head: Normocephalic and atraumatic.  ?   Nose: No septal deviation, congestion or rhinorrhea.  ?   Right Sinus: No maxillary sinus tenderness or frontal sinus tenderness.  ?    Left Sinus: No maxillary sinus tenderness or frontal sinus tenderness.  ?   Mouth/Throat:  ?   Mouth: Mucous membranes are moist.  ?   Palate: Mass present.  ?   Pharynx: Oropharynx is clear. Uvula midline. No oropharyngeal exudate, posterior oropharyngeal erythema or uvula swelling.  ?   Tonsils: No tonsillar exudate.  ?   Comments: Mass noted to left hard palate without obvious area of fluctuance or drainage.  ?Eyes:  ?   General: No scleral icterus. ?   Conjunctiva/sclera: Conjunctivae normal.  ?Cardiovascular:  ?   Rate and Rhythm: Normal rate and regular rhythm.  ?   Pulses: Normal pulses.  ?   Heart sounds: Normal heart sounds.  ?Pulmonary:  ?   Effort: Pulmonary effort is normal. No respiratory distress.  ?   Breath sounds: Normal breath sounds. No wheezing.  ?Abdominal:  ?   General: Bowel sounds are normal.  ?   Palpations: Abdomen is soft. There is no mass.  ?   Tenderness: There is no abdominal tenderness. There is no guarding or rebound.  ?Musculoskeletal:     ?   General: Normal range of motion.  ?   Cervical back: Normal range of motion and neck supple.  ?Skin: ?   General: Skin is warm and dry.  ?Neurological:  ?   Mental Status: She is alert.  ?Psychiatric:     ?   Behavior: Behavior normal.  ? ? ?ED Results / Procedures / Treatments   ?Labs ?(all labs ordered are listed, but only abnormal results are displayed) ?Labs Reviewed  ?I-STAT CHEM 8, ED - Abnormal; Notable for the following components:  ?    Result Value  ? Calcium, Ion 1.13 (*)   ? Hemoglobin 11.9 (*)   ? HCT 35.0 (*)   ? All other components within normal limits  ? ? ?EKG ?None ? ?Radiology ?CT Maxillofacial Wo Contrast ? ?Result Date: 08/04/2021 ?CLINICAL DATA:  Palate mass EXAM: CT MAXILLOFACIAL WITHOUT CONTRAST TECHNIQUE: Multidetector CT imaging of the maxillofacial structures was performed. Multiplanar CT image reconstructions were also generated. RADIATION DOSE REDUCTION: This exam was performed according to the departmental  dose-optimization program which includes automated exposure control, adjustment of the mA and/or kV according to patient size and/or use of iterative reconstruction technique. COMPARISON:  CTA neck 10/08/2016 FINDINGS: Osseous: There is irregularity and destructive change of the hard palate posteriorly in the region of an approximately 3.7 cm AP x 2.6 cm TV x 2.2 cm solid mass which is centered in the left aspect of the hard palate, increased in size from 3.1 cm by 2.3 cm by 2.1 cm in 2018. The mass extends into the inferior aspect of the posterior nasal cavity. There is soft tissue density in the nasal cavity centered at the midline measuring 2.6 cm TV x 2.6 cm AP x 1.9 cm cc with smooth expansile bony remodeling of the nasal septum (10-50,  3-50). This is new since 2018. There is cystic change in the left maxillary alveolar ridge which is new since 2018 measuring up to approximately 2.1 cm cc associated with root of the posterior most remaining maxillary tooth (10-59). The density of the cystic changes just above that of simple fluid. The surrounding bone is smoothly remodeled without osseous destruction. Postsurgical changes are noted in the left lateral orbital wall. There is no acute osseous abnormality. Orbits: The globes and orbits are unremarkable. Sinuses: There is mild mucosal thickening along the floors of the maxillary sinuses and in the ethmoid air cells. Soft tissues: The soft tissues are otherwise unremarkable. Limited intracranial: Postsurgical changes reflecting aneurysm clipping are noted. Imaged portions of the intracranial compartment are otherwise unremarkable. IMPRESSION: 1. 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 2. Soft tissue lesion in the midline nasal cavity with smooth expansile bony remodeling of the nasal septum is new since 2018. 3. Recommend tissue sampling and ENT referral for both of the above  lesions, if not already referred. 4. Cystic lesion in the left maxillary alveolar ridge is favored to reflect a radicular cyst related to the posterior most remaining maxillary tooth as the root of the tooth protrude

## 2021-08-04 NOTE — ED Notes (Signed)
Patient transported to CT 

## 2021-08-04 NOTE — Discharge Instructions (Addendum)
It was a pleasure taking care of you today! ? ?Your CT scan was notable for area of concern to the roof of your mouth as well as to the nasal cavity.  Attached is information for the on-call ENT specialist, call tomorrow and set up a follow-up appointment regarding today's ED visit.  Call your primary care provider to set up a follow-up appointment regarding today's visit.  Return to the emergency department for experiencing increasing/worsening nasal pain, trouble breathing, worsening symptoms. ?

## 2021-08-04 NOTE — ED Triage Notes (Signed)
Pt here with runny nose for "a long time now". Reports progressive worsening. Pt seen previously for same and given abx without improvement. ?

## 2021-08-08 ENCOUNTER — Ambulatory Visit: Payer: Self-pay | Admitting: Nurse Practitioner

## 2021-08-24 ENCOUNTER — Encounter: Payer: Self-pay | Admitting: Nurse Practitioner

## 2021-08-24 ENCOUNTER — Ambulatory Visit (INDEPENDENT_AMBULATORY_CARE_PROVIDER_SITE_OTHER): Payer: Self-pay | Admitting: Nurse Practitioner

## 2021-08-24 VITALS — BP 138/88 | HR 87 | Temp 97.7°F | Ht 64.0 in | Wt 194.0 lb

## 2021-08-24 DIAGNOSIS — I152 Hypertension secondary to endocrine disorders: Secondary | ICD-10-CM

## 2021-08-24 DIAGNOSIS — Z794 Long term (current) use of insulin: Secondary | ICD-10-CM

## 2021-08-24 DIAGNOSIS — I119 Hypertensive heart disease without heart failure: Secondary | ICD-10-CM

## 2021-08-24 DIAGNOSIS — E669 Obesity, unspecified: Secondary | ICD-10-CM

## 2021-08-24 DIAGNOSIS — R202 Paresthesia of skin: Secondary | ICD-10-CM

## 2021-08-24 DIAGNOSIS — I10 Essential (primary) hypertension: Secondary | ICD-10-CM

## 2021-08-24 DIAGNOSIS — G629 Polyneuropathy, unspecified: Secondary | ICD-10-CM

## 2021-08-24 DIAGNOSIS — J3489 Other specified disorders of nose and nasal sinuses: Secondary | ICD-10-CM

## 2021-08-24 DIAGNOSIS — E1169 Type 2 diabetes mellitus with other specified complication: Secondary | ICD-10-CM

## 2021-08-24 DIAGNOSIS — E1159 Type 2 diabetes mellitus with other circulatory complications: Secondary | ICD-10-CM

## 2021-08-24 DIAGNOSIS — G2581 Restless legs syndrome: Secondary | ICD-10-CM

## 2021-08-24 DIAGNOSIS — E119 Type 2 diabetes mellitus without complications: Secondary | ICD-10-CM

## 2021-08-24 MED ORDER — ATORVASTATIN CALCIUM 40 MG PO TABS: 40.0000 mg | ORAL_TABLET | Freq: Every day | ORAL | 0 refills | Status: AC

## 2021-08-24 MED ORDER — GABAPENTIN 300 MG PO CAPS: ORAL_CAPSULE | ORAL | 0 refills | Status: AC

## 2021-08-24 MED ORDER — ATORVASTATIN CALCIUM 40 MG PO TABS: 40.0000 mg | ORAL_TABLET | Freq: Every day | ORAL | 0 refills | Status: DC

## 2021-08-24 MED ORDER — METFORMIN HCL 500 MG PO TABS: 500.0000 mg | ORAL_TABLET | Freq: Two times a day (BID) | ORAL | 1 refills | Status: DC

## 2021-08-24 MED ORDER — CARVEDILOL 12.5 MG PO TABS: 12.5000 mg | ORAL_TABLET | Freq: Two times a day (BID) | ORAL | 0 refills | Status: AC

## 2021-08-24 MED ORDER — METFORMIN HCL 500 MG PO TABS: 500.0000 mg | ORAL_TABLET | Freq: Two times a day (BID) | ORAL | 1 refills | Status: AC

## 2021-08-24 MED ORDER — GABAPENTIN 300 MG PO CAPS: ORAL_CAPSULE | ORAL | 0 refills | Status: DC

## 2021-08-24 MED ORDER — CARVEDILOL 12.5 MG PO TABS: 12.5000 mg | ORAL_TABLET | Freq: Two times a day (BID) | ORAL | 0 refills | Status: DC

## 2021-08-24 NOTE — Patient Instructions (Addendum)
Hypertension, Adult ?High blood pressure (hypertension) is when the force of blood pumping through the arteries is too strong. The arteries are the blood vessels that carry blood from the heart throughout the body. Hypertension forces the heart to work harder to pump blood and may cause arteries to become narrow or stiff. Untreated or uncontrolled hypertension can lead to a heart attack, heart failure, a stroke, kidney disease, and other problems. ?A blood pressure reading consists of a higher number over a lower number. Ideally, your blood pressure should be below 120/80. The first ("top") number is called the systolic pressure. It is a measure of the pressure in your arteries as your heart beats. The second ("bottom") number is called the diastolic pressure. It is a measure of the pressure in your arteries as the heart relaxes. ?What are the causes? ?The exact cause of this condition is not known. There are some conditions that result in high blood pressure. ?What increases the risk? ?Certain factors may make you more likely to develop high blood pressure. Some of these risk factors are under your control, including: ?Smoking. ?Not getting enough exercise or physical activity. ?Being overweight. ?Having too much fat, sugar, calories, or salt (sodium) in your diet. ?Drinking too much alcohol. ?Other risk factors include: ?Having a personal history of heart disease, diabetes, high cholesterol, or kidney disease. ?Stress. ?Having a family history of high blood pressure and high cholesterol. ?Having obstructive sleep apnea. ?Age. The risk increases with age. ?What are the signs or symptoms? ?High blood pressure may not cause symptoms. Very high blood pressure (hypertensive crisis) may cause: ?Headache. ?Fast or irregular heartbeats (palpitations). ?Shortness of breath. ?Nosebleed. ?Nausea and vomiting. ?Vision changes. ?Severe chest pain, dizziness, and seizures. ?How is this diagnosed? ?This condition is diagnosed by  measuring your blood pressure while you are seated, with your arm resting on a flat surface, your legs uncrossed, and your feet flat on the floor. The cuff of the blood pressure monitor will be placed directly against the skin of your upper arm at the level of your heart. Blood pressure should be measured at least twice using the same arm. Certain conditions can cause a difference in blood pressure between your right and left arms. ?If you have a high blood pressure reading during one visit or you have normal blood pressure with other risk factors, you may be asked to: ?Return on a different day to have your blood pressure checked again. ?Monitor your blood pressure at home for 1 week or longer. ?If you are diagnosed with hypertension, you may have other blood or imaging tests to help your health care provider understand your overall risk for other conditions. ?How is this treated? ?This condition is treated by making healthy lifestyle changes, such as eating healthy foods, exercising more, and reducing your alcohol intake. You may be referred for counseling on a healthy diet and physical activity. ?Your health care provider may prescribe medicine if lifestyle changes are not enough to get your blood pressure under control and if: ?Your systolic blood pressure is above 130. ?Your diastolic blood pressure is above 80. ?Your personal target blood pressure may vary depending on your medical conditions, your age, and other factors. ?Follow these instructions at home: ?Eating and drinking ? ?Eat a diet that is high in fiber and potassium, and low in sodium, added sugar, and fat. An example of this eating plan is called the DASH diet. DASH stands for Dietary Approaches to Stop Hypertension. To eat this way: ?Eat  plenty of fresh fruits and vegetables. Try to fill one half of your plate at each meal with fruits and vegetables. ?Eat whole grains, such as whole-wheat pasta, brown rice, or whole-grain bread. Fill about one  fourth of your plate with whole grains. ?Eat or drink low-fat dairy products, such as skim milk or low-fat yogurt. ?Avoid fatty cuts of meat, processed or cured meats, and poultry with skin. Fill about one fourth of your plate with lean proteins, such as fish, chicken without skin, beans, eggs, or tofu. ?Avoid pre-made and processed foods. These tend to be higher in sodium, added sugar, and fat. ?Reduce your daily sodium intake. Many people with hypertension should eat less than 1,500 mg of sodium a day. ?Do not drink alcohol if: ?Your health care provider tells you not to drink. ?You are pregnant, may be pregnant, or are planning to become pregnant. ?If you drink alcohol: ?Limit how much you have to: ?0-1 drink a day for women. ?0-2 drinks a day for men. ?Know how much alcohol is in your drink. In the [place] one drink equals one 12 oz bottle of beer (355 mL), one 5 oz glass of wine (148 mL), or one 1? oz glass of hard liquor (44 mL). ?Lifestyle ? ?Work with your health care provider to maintain a healthy body weight or to lose weight. Ask what an ideal weight is for you. ?Get at least 30 minutes of exercise that causes your heart to beat faster (aerobic exercise) most days of the week. Activities may include walking, swimming, or biking. ?Include exercise to strengthen your muscles (resistance exercise), such as Pilates or lifting weights, as part of your weekly exercise routine. Try to do these types of exercises for 30 minutes at least 3 days a week. ?Do not use any products that contain nicotine or tobacco. These products include cigarettes, chewing tobacco, and vaping devices, such as e-cigarettes. If you need help quitting, ask your health care provider. ?Monitor your blood pressure at home as told by your health care provider. ?Keep all follow-up visits. This is important. ?Medicines ?Take over-the-counter and prescription medicines only as told by your health care provider. Follow directions carefully. Blood  pressure medicines must be taken as prescribed. ?Do not skip doses of blood pressure medicine. Doing this puts you at risk for problems and can make the medicine less effective. ?Ask your health care provider about side effects or reactions to medicines that you should watch for. ?Contact a health care provider if you: ?Think you are having a reaction to a medicine you are taking. ?Have headaches that keep coming back (recurring). ?Feel dizzy. ?Have swelling in your ankles. ?Have trouble with your vision. ?Get help right away if you: ?Develop a severe headache or confusion. ?Have unusual weakness or numbness. ?Feel faint. ?Have severe pain in your chest or abdomen. ?Vomit repeatedly. ?Have trouble breathing. ?These symptoms may be an emergency. Get help right away. Call 911. ?Do not wait to see if the symptoms will go away. ?Do not drive yourself to the hospital. ?Summary ?Hypertension is when the force of blood pumping through your arteries is too strong. If this condition is not controlled, it may put you at risk for serious complications. ?Your personal target blood pressure may vary depending on your medical conditions, your age, and other factors. For most people, a normal blood pressure is less than 120/80. ?Hypertension is treated with lifestyle changes, medicines, or a combination of both. Lifestyle changes include losing weight, eating a healthy,  low-sodium diet, exercising more, and limiting alcohol. ?This information is not intended to replace advice given to you by your health care provider. Make sure you discuss any questions you have with your health care provider. ?Document Revised: 02/08/2021 Document Reviewed: 02/08/2021 ?Elsevier Patient Education ? [place]. ? ? ?[facility] [address] [facility]   ?[place], [place] [address]   ?Phone: [phone]   ?Fax: [phone]   ?

## 2021-08-24 NOTE — Progress Notes (Signed)
I,Tianna Badgett,acting as a Education administrator for [employer], FNP.,have documented all relevant documentation on the behalf of [name], FNP,as directed by  [name], FNP while in the presence of [name], [place].  This visit occurred during the SARS-CoV-2 public health emergency.  Safety protocols were in place, including screening questions prior to the visit, additional usage of staff PPE, and extensive cleaning of exam room while observing appropriate contact time as indicated for disinfecting solutions.  Subjective:     Patient ID: Jessica Cruz , female    DOB: 05-14-1965 , 56 y.o.   MRN: [id number]   Chief Complaint  Patient presents with   Hypertension    HPI  Patient presents today for a diabetes and hypertension f/u. She has been to the ER for persistent sinus. She has a mass in her hard palate and found something new in the nasal. She has seen Dr Constance Holster and would like her to go to [facility] she is awaiting a call. She is only taking metformin only due to her husband having the same medication  Hypertension This is a chronic problem. The current episode started more than 1 year ago. The problem is unchanged. The problem is controlled. Associated symptoms include neck pain. Pertinent negatives include no blurred vision or palpitations. Past treatments include beta blockers and direct vasodilators. Compliance problems include exercise.  There is no history of chronic renal disease.  Diabetes She presents for her follow-up diabetic visit. She has type 2 diabetes mellitus. Her disease course has been improving. There are no hypoglycemic associated symptoms. Pertinent negatives for diabetes include no blurred vision, no polydipsia, no polyphagia and no polyuria. There are no hypoglycemic complications. There are no diabetic complications. Current diabetic treatment includes oral agent (monotherapy). She is compliant with treatment some of the time. (Blood sugars are not more than 150. She  continues to take Humalog 6 units ) She does not see a podiatrist.Eye exam is not current.    Past Medical History:  Diagnosis Date   Anxiety    Brain aneurysm    a. h/o intracranial aneurysm rupture (clipped [facility] 2007).   Essential hypertension    Fibroids    Hyperlipidemia    Hypertension    Hypertensive heart disease    Liver nodule    Menorrhagia    Microcytic anemia    Non-ST elevation myocardial infarction (NSTEMI), type 2    a. demand ischemia 01/2016 in setting of accelerated HTN, anemia - normal cors by cath.   Obesity    Pre-diabetes    Stroke [facility])      Family History  Problem Relation Age of Onset   Heart attack Father 27   Stroke Father    Diabetes Mother    Hypertension Mother    Heart disease Maternal Grandmother    Stroke Maternal Grandfather    Heart attack Paternal Grandmother    Heart disease Paternal Grandmother    Heart failure Paternal Grandmother    Heart failure Paternal Grandfather    Heart disease Paternal Grandfather    Heart attack Paternal Grandfather    Stroke Paternal Grandfather      Current Outpatient Medications:    acetaminophen (TYLENOL) 500 MG tablet, Take 1,000 mg by mouth 2 (two) times daily as needed for mild pain or headache., Disp: , Rfl:    aspirin 325 MG tablet, Take 1 tablet (325 mg total) by mouth daily. (Patient not taking: Reported on 08/04/2021), Disp: 30 tablet, Rfl: 0   atorvastatin (  LIPITOR) 40 MG tablet, Take 1 tablet (40 mg total) by mouth daily., Disp: 90 tablet, Rfl: 0   benzonatate (TESSALON PERLES) 100 MG capsule, Take 1 capsule (100 mg total) by mouth 3 (three) times daily as needed for cough. (Patient not taking: Reported on 08/04/2021), Disp: 30 capsule, Rfl: 1   blood glucose meter kit and supplies KIT, Dispense based on patient and insurance preference. Use up to four times daily as directed. (FOR ICD-9 250.00, 250.01)., Disp: 1 each, Rfl: 0   carvedilol (COREG) 12.5 MG tablet, Take 1 tablet (12.5 mg total) by  mouth 2 (two) times daily with a meal., Disp: 180 tablet, Rfl: 0   gabapentin (NEURONTIN) 300 MG capsule, Take 1 capsule by mouth twice daily, Disp: 180 capsule, Rfl: 0   guaiFENesin-dextromethorphan (ROBITUSSIN DM) 100-10 MG/5ML syrup, Take 5 mLs by mouth every 4 (four) hours as needed for cough. (Patient not taking: Reported on 08/04/2021), Disp: 118 mL, Rfl: 0   hydrALAZINE (APRESOLINE) 50 MG tablet, Take 1 tablet (50 mg total) by mouth every 8 (eight) hours. (Patient not taking: Reported on 08/04/2021), Disp: 270 tablet, Rfl: 0   Insulin Syringe-Needle U-100 (INSULIN SYRINGE .5CC/30GX1/2") 30G X 1/2" 0.5 ML MISC, As needed, Disp: 100 each, Rfl: 0   losartan (COZAAR) 50 MG tablet, Take 1 tablet (50 mg total) by mouth daily. (Patient not taking: Reported on 08/04/2021), Disp: 90 tablet, Rfl: 0   metFORMIN (GLUCOPHAGE) 500 MG tablet, Take 1 tablet (500 mg total) by mouth 2 (two) times daily with a meal., Disp: 180 tablet, Rfl: 1   Allergies  Allergen Reactions   Shrimp (Diagnostic) Anaphylaxis   Pineapple Itching and Swelling    Tongue and throat affected    Shellfish Allergy Itching and Swelling    Tongue and throat affected     Review of Systems  Constitutional: Negative.   HENT:  Positive for rhinorrhea.   Eyes:  Negative for blurred vision.  Respiratory: Negative.    Cardiovascular: Negative.  Negative for palpitations.  Gastrointestinal: Negative.   Endocrine: Negative for polydipsia, polyphagia and polyuria.  Musculoskeletal:  Positive for neck pain.  Neurological: Negative.   Psychiatric/Behavioral: Negative.      Today's Vitals   08/24/21 1620  BP: 138/88  Pulse: 87  Temp: 97.7 F (36.5 C)  TempSrc: Oral  Weight: 194 lb (88 kg)  Height: '5\' 4"'  (1.626 m)   Body mass index is 33.3 kg/m.   Objective:  Physical Exam Vitals reviewed.  Constitutional:      General: She is not in acute distress.    Appearance: Normal appearance. She is obese.  Cardiovascular:     Rate  and Rhythm: Normal rate and regular rhythm.     Pulses: Normal pulses.     Heart sounds: Normal heart sounds. No murmur heard. Pulmonary:     Effort: Pulmonary effort is normal. No respiratory distress.     Breath sounds: Normal breath sounds. No wheezing.  Musculoskeletal:        General: Tenderness: posterior neck and low back .     Comments: Muscles to posterior neck and shoulders are tense  Skin:    General: Skin is warm and dry.     Capillary Refill: Capillary refill takes less than 2 seconds.  Neurological:     General: No focal deficit present.     Mental Status: She is alert and oriented to person, place, and time.     Cranial Nerves: No cranial nerve deficit.  Motor: No weakness.  Psychiatric:        Mood and Affect: Mood normal.        Behavior: Behavior normal.        Thought Content: Thought content normal.        Judgment: Judgment normal.        Assessment And Plan:     1. Essential hypertension Comments: Blood pressure is fairly controlled, continue current medications  2. Type 2 diabetes mellitus with other specified complication, with long-term current use of insulin (HCC) - Hemoglobin A1c - Urine Albumin-Creatinine with uACR - Renal function panel with eGFR  3. Hypertensive heart disease without heart failure Comments: Blood pressure is fairly controlled, I have had a long discussion with her again to consider her treatment with the free clinic - atorvastatin (LIPITOR) 40 MG tablet; Take 1 tablet (40 mg total) by mouth daily.  Dispense: 90 tablet; Refill: 0 - carvedilol (COREG) 12.5 MG tablet; Take 1 tablet (12.5 mg total) by mouth 2 (two) times daily with a meal.  Dispense: 180 tablet; Refill: 0  4. Obesity, diabetes, and hypertension syndrome ([place])  5. Neuropathy - gabapentin (NEURONTIN) 300 MG capsule; Take 1 capsule by mouth twice daily  Dispense: 180 capsule; Refill: 0  6. Tingling in extremities - gabapentin (NEURONTIN) 300 MG capsule; Take 1  capsule by mouth twice daily  Dispense: 180 capsule; Refill: 0  7. Restless leg Comments: She is to continue taking gabapentin - gabapentin (NEURONTIN) 300 MG capsule; Take 1 capsule by mouth twice daily  Dispense: 180 capsule; Refill: 0  8. Nasal mass Comments: She is being followed by ENT     Patient was given opportunity to ask questions. Patient verbalized understanding of the plan and was able to repeat key elements of the plan. All questions were answered to their satisfaction.  [name], FNP   I, [name], FNP, have reviewed all documentation for this visit. The documentation on 08/24/21 for the exam, diagnosis, procedures, and orders are all accurate and complete.   IF YOU HAVE BEEN REFERRED TO A SPECIALIST, IT MAY TAKE 1-2 WEEKS TO SCHEDULE/PROCESS THE REFERRAL. IF YOU HAVE NOT HEARD FROM US/SPECIALIST IN TWO WEEKS, PLEASE GIVE [place] A CALL AT [phone].   THE PATIENT IS ENCOURAGED TO PRACTICE SOCIAL DISTANCING DUE TO THE COVID-19 PANDEMIC.

## 2021-08-25 ENCOUNTER — Other Ambulatory Visit: Payer: Self-pay | Admitting: Nurse Practitioner

## 2021-08-25 LAB — HEMOGLOBIN A1C
Est. average glucose Bld gHb Est-mCnc: 120 mg/dL
Hgb A1c MFr Bld: 5.8 % — ABNORMAL HIGH (ref 4.8–5.6)

## 2021-08-25 LAB — RENAL FUNCTION PANEL
Albumin: 4.5 g/dL (ref 3.8–4.9)
BUN/Creatinine Ratio: 17 (ref 9–23)
BUN: 15 mg/dL (ref 6–24)
CO2: 22 mmol/L (ref 20–29)
Calcium: 9.6 mg/dL (ref 8.7–10.2)
Chloride: 103 mmol/L (ref 96–106)
Creatinine, Ser: 0.89 mg/dL (ref 0.57–1.00)
Glucose: 86 mg/dL (ref 70–99)
Phosphorus: 3.3 mg/dL (ref 3.0–4.3)
Potassium: 4.3 mmol/L (ref 3.5–5.2)
Sodium: 141 mmol/L (ref 134–144)
eGFR: 77 mL/min/{1.73_m2} (ref >59)

## 2021-08-25 LAB — MICROALBUMIN / CREATININE URINE RATIO
Creatinine, Urine: 59 mg/dL
Microalb/Creat Ratio: <5 mg/g creat (ref 0–29)
Microalbumin, Urine: <3 ug/mL

## 2021-12-29 ENCOUNTER — Other Ambulatory Visit: Payer: Self-pay

## 2021-12-29 ENCOUNTER — Emergency Department (HOSPITAL_COMMUNITY)
Admission: EM | Admit: 2021-12-29 | Discharge: 2021-12-30 | Disposition: A | Discharge status: Home / Self Care | Payer: Medicaid Other | Attending: Student | Admitting: Student

## 2021-12-29 ENCOUNTER — Ambulatory Visit
Admission: EM | Admit: 2021-12-29 | Discharge: 2021-12-29 | Disposition: A | Discharge status: Home / Self Care | Payer: Self-pay

## 2021-12-29 ENCOUNTER — Encounter (HOSPITAL_COMMUNITY): Payer: Self-pay | Admitting: Emergency Medicine

## 2021-12-29 DIAGNOSIS — R197 Diarrhea, unspecified: Secondary | ICD-10-CM | POA: Insufficient documentation

## 2021-12-29 DIAGNOSIS — R112 Nausea with vomiting, unspecified: Secondary | ICD-10-CM | POA: Insufficient documentation

## 2021-12-29 DIAGNOSIS — Z5321 Procedure and treatment not carried out due to patient leaving prior to being seen by health care provider: Secondary | ICD-10-CM | POA: Insufficient documentation

## 2021-12-29 DIAGNOSIS — R109 Unspecified abdominal pain: Secondary | ICD-10-CM | POA: Diagnosis not present

## 2021-12-29 DIAGNOSIS — Z20822 Contact with and (suspected) exposure to covid-19: Secondary | ICD-10-CM | POA: Diagnosis not present

## 2021-12-29 LAB — COMPREHENSIVE METABOLIC PANEL
ALT: 16 U/L (ref 0–44)
AST: 15 U/L (ref 15–41)
Albumin: 3.3 g/dL — ABNORMAL LOW (ref 3.5–5.0)
Alkaline Phosphatase: 53 U/L (ref 38–126)
Anion gap: 9 (ref 5–15)
BUN: 9 mg/dL (ref 6–20)
CO2: 26 mmol/L (ref 22–32)
Calcium: 9 mg/dL (ref 8.9–10.3)
Chloride: 106 mmol/L (ref 98–111)
Creatinine, Ser: 0.75 mg/dL (ref 0.44–1.00)
GFR, Estimated: >60 mL/min (ref >60)
Glucose, Bld: 102 mg/dL — ABNORMAL HIGH (ref 70–99)
Potassium: 3.7 mmol/L (ref 3.5–5.1)
Sodium: 141 mmol/L (ref 135–145)
Total Bilirubin: 0.9 mg/dL (ref 0.3–1.2)
Total Protein: 6.5 g/dL (ref 6.5–8.1)

## 2021-12-29 LAB — SARS CORONAVIRUS 2 BY RT PCR: SARS Coronavirus 2 by RT PCR: NEGATIVE

## 2021-12-29 LAB — CBC WITH DIFFERENTIAL/PLATELET
Abs Immature Granulocytes: 0.01 10*3/uL (ref 0.00–0.07)
Basophils Absolute: 0 10*3/uL (ref 0.0–0.1)
Basophils Relative: 1 %
Eosinophils Absolute: 0.3 10*3/uL (ref 0.0–0.5)
Eosinophils Relative: 6 %
HCT: 31.6 % — ABNORMAL LOW (ref 36.0–46.0)
Hemoglobin: 9.3 g/dL — ABNORMAL LOW (ref 12.0–15.0)
Immature Granulocytes: 0 %
Lymphocytes Relative: 31 %
Lymphs Abs: 1.7 10*3/uL (ref 0.7–4.0)
MCH: 21.3 pg — ABNORMAL LOW (ref 26.0–34.0)
MCHC: 29.4 g/dL — ABNORMAL LOW (ref 30.0–36.0)
MCV: 72.5 fL — ABNORMAL LOW (ref 80.0–100.0)
Monocytes Absolute: 0.4 10*3/uL (ref 0.1–1.0)
Monocytes Relative: 7 %
Neutro Abs: 3 10*3/uL (ref 1.7–7.7)
Neutrophils Relative %: 55 %
Platelets: 227 10*3/uL (ref 150–400)
RBC: 4.36 MIL/uL (ref 3.87–5.11)
RDW: 19.2 % — ABNORMAL HIGH (ref 11.5–15.5)
WBC: 5.4 10*3/uL (ref 4.0–10.5)
nRBC: 0 % (ref 0.0–0.2)

## 2021-12-29 LAB — LIPASE, BLOOD: Lipase: 23 U/L (ref 11–51)

## 2021-12-29 LAB — URINALYSIS, ROUTINE W REFLEX MICROSCOPIC
Bilirubin Urine: NEGATIVE
Glucose, UA: NEGATIVE mg/dL
Hgb urine dipstick: NEGATIVE
Ketones, ur: NEGATIVE mg/dL
Leukocytes,Ua: NEGATIVE
Nitrite: NEGATIVE
Protein, ur: NEGATIVE mg/dL
Specific Gravity, Urine: 1.015 (ref 1.005–1.030)
pH: 7 (ref 5.0–8.0)

## 2021-12-29 NOTE — ED Provider Notes (Signed)
Patient presents to the emergency room complaining of acute onset of abdominal pain that has been gradually worsening since it began this morning.  Patient has a history of mucoepidermoid carcinoma of hard palate, type 2 diabetes and essential hypertension.  Patient's blood pressure was 193/130 on arrival today.  Patient reports diarrhea this afternoon prior to arrival.  Patient advised that I recommend she seek a higher level of care for more advanced imaging that we can provide here at urgent care.  Per my observation, patient was mentating well during our conversation.  I feel she is stable for transport by private vehicle to the emergency room.  Patient agreed to go now.   Lynden Oxford Scales, PA-C 12/29/21 1756

## 2021-12-29 NOTE — ED Provider Triage Note (Signed)
Emergency Medicine Provider Triage Evaluation Note  Jessica Cruz , a 56 y.o. female  was evaluated in triage.  Pt complains of abdominal pain which started this morning.  Associated with nausea, vomiting and loose stool but not frank diarrhea.  Initially went to urgent care, told to go to ED due to history of recent tumor removal for hard palate.  She is not undergoing any chemo or radiation, no history of previous abdominal surgeries.  Review of Systems  Per HPI  Physical Exam  LMP 10/30/2018 (Approximate)  Gen:   Awake, no distress   Resp:  Normal effort  MSK:   Moves extremities without difficulty  Other:  Upper abdominal tenderness.  Medical Decision Making  Medically screening exam initiated at 9:46 PM.  Appropriate orders placed.  ASUKA DUSSEAU was informed that the remainder of the evaluation will be completed by another provider, this initial triage assessment does not replace that evaluation, and the importance of remaining in the ED until their evaluation is complete.     Sherrill Raring, PA-C 12/29/21 2148

## 2021-12-29 NOTE — ED Notes (Signed)
Patient is being discharged from the Urgent Care and sent to the Emergency Department via [place] . Per L. Morgan-Scales PA-C , patient is in need of higher level of care due to need for further evaluation . Patient is aware and verbalizes understanding of plan of care.  Vitals:   12/29/21 1743 12/29/21 1745  BP: (!) 193/134 (!) 193/130  Pulse: 62   Resp: 16   Temp:  98.2 F (36.8 C)  SpO2: 98%

## 2021-12-29 NOTE — ED Triage Notes (Signed)
Pt c/o generalized abd pain that started this morning, cough and runny nose. The patient denies having loose stools but they are frequent.   Home interventions: tylenol

## 2021-12-29 NOTE — ED Triage Notes (Signed)
Pt reported to ED with c/o generalized abdominal pain, cough and running nose.

## 2021-12-30 NOTE — ED Notes (Signed)
Patient left ama.

## 2022-01-18 ENCOUNTER — Other Ambulatory Visit: Payer: Self-pay | Admitting: Nurse Practitioner

## 2022-01-18 DIAGNOSIS — I119 Hypertensive heart disease without heart failure: Secondary | ICD-10-CM

## 2022-02-20 ENCOUNTER — Other Ambulatory Visit: Payer: Self-pay | Admitting: Nurse Practitioner

## 2022-02-20 DIAGNOSIS — I119 Hypertensive heart disease without heart failure: Secondary | ICD-10-CM

## 2022-06-14 ENCOUNTER — Other Ambulatory Visit: Payer: Self-pay | Admitting: Nurse Practitioner

## 2022-06-14 DIAGNOSIS — I119 Hypertensive heart disease without heart failure: Secondary | ICD-10-CM

## 2023-01-31 ENCOUNTER — Ambulatory Visit: Payer: Medicaid Other | Admitting: Internal Medicine

## 2023-09-26 IMAGING — CT CT MAXILLOFACIAL W/O CM
3 series · 14 of 47 positions shown, 16 images · non-contrast
Comparison: CTA neck 10/08/2016

CLINICAL DATA: Palate mass



[Series 3: facial/ orbits 2.0 h30s · axial · 0.36mm/px · z∈[-207,-71]mm · 8 of 80 slices shown, 10 images]
[im 6/80  brain]
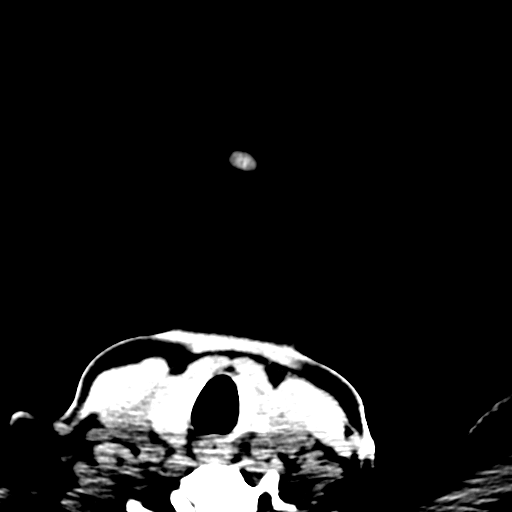
[im 6/80  bone]
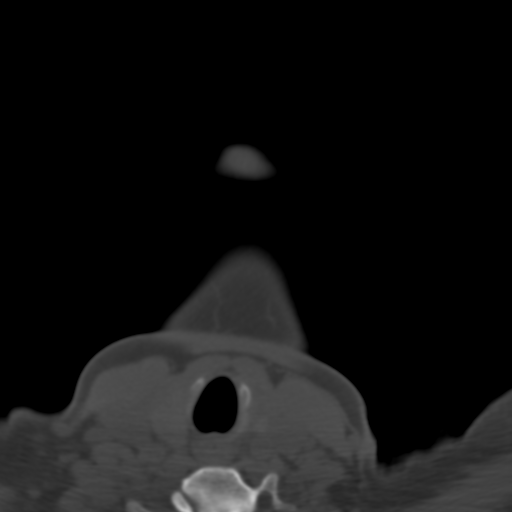
[im 17/80  bone]
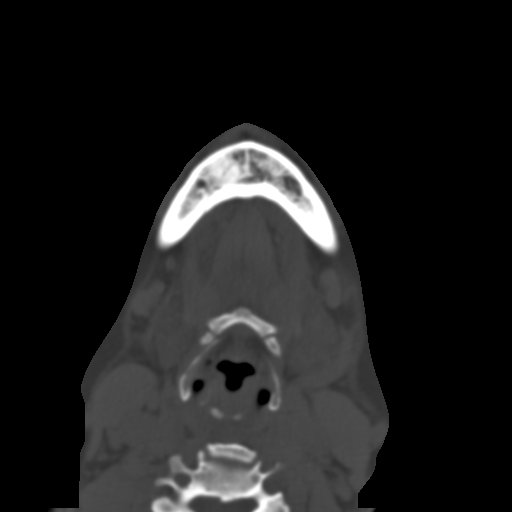
[im 25/80  bone]
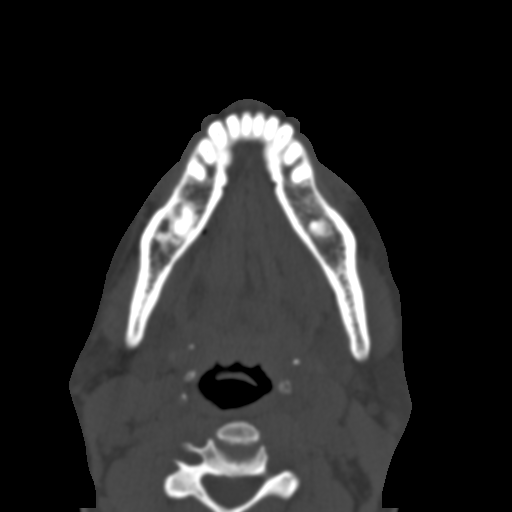
[im 36/80  bone]
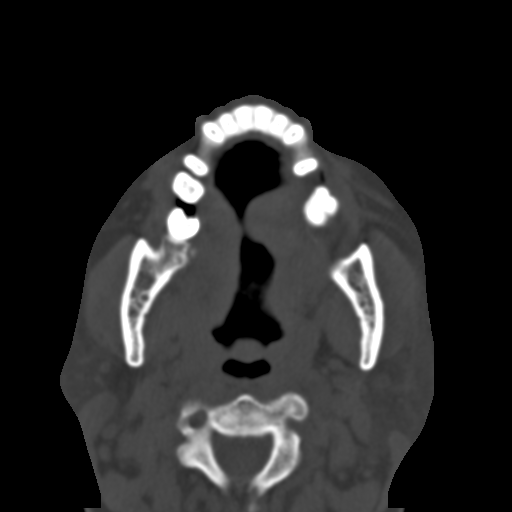
[im 44/80  brain]
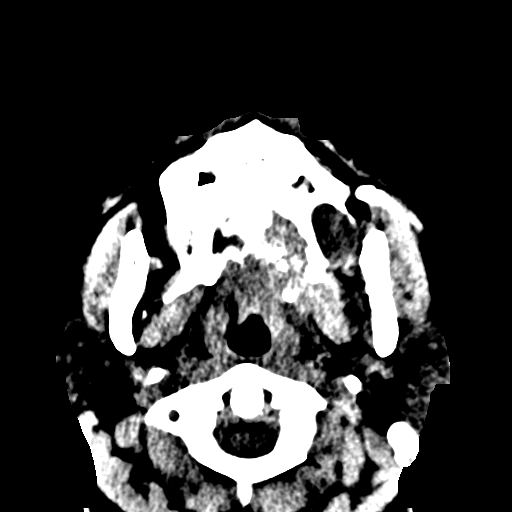
[im 44/80  bone]
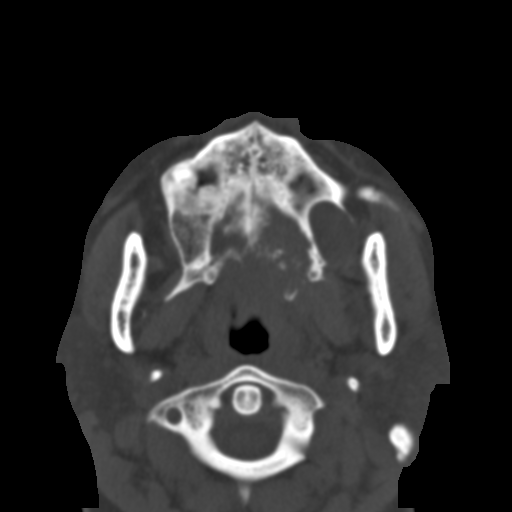
[im 55/80  bone]
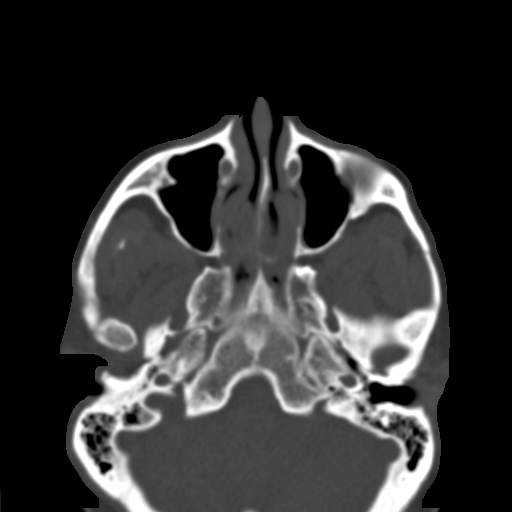
[im 63/80  bone]
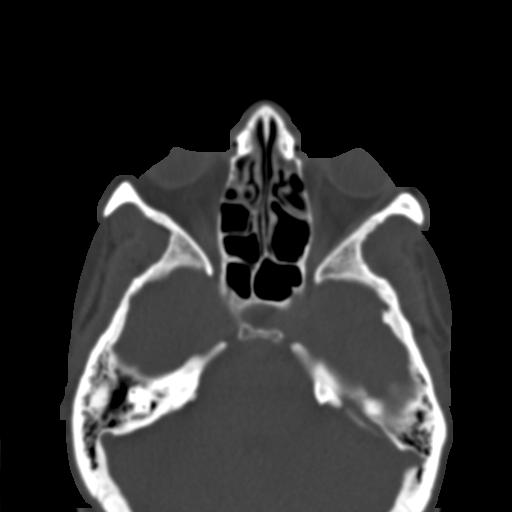
[im 74/80  bone]
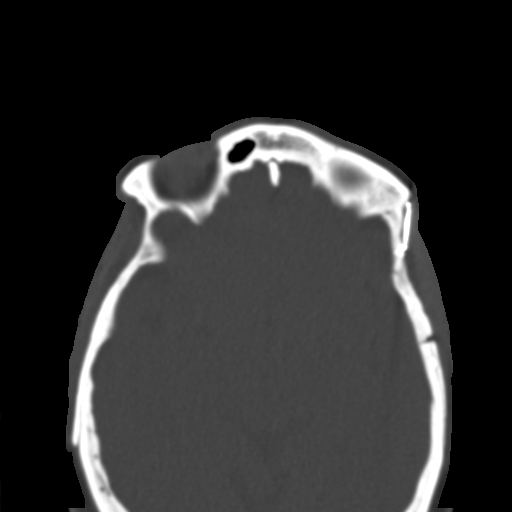

[Series 7: coronal soft tissue · coronal · 0.33mm/px · 3 of 76 slices shown]
[im 26/76  bone]
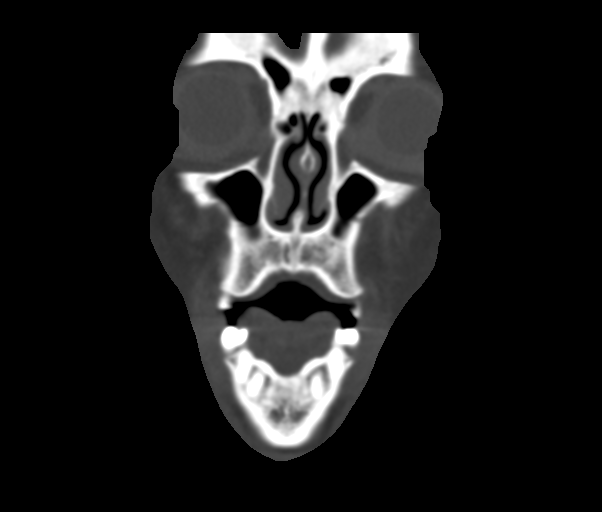
[im 34/76  bone]
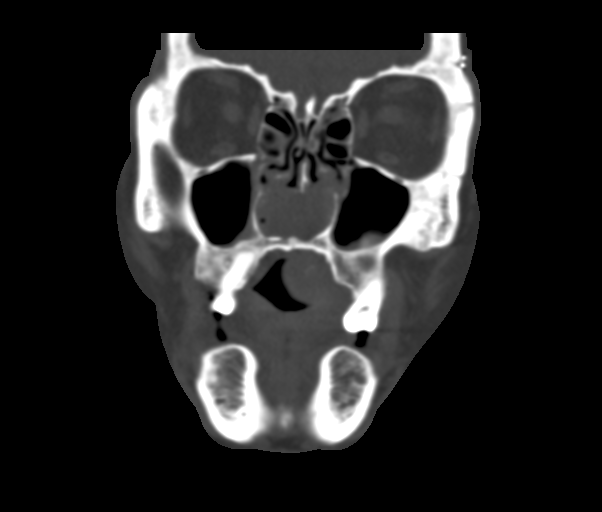
[im 42/76  bone]
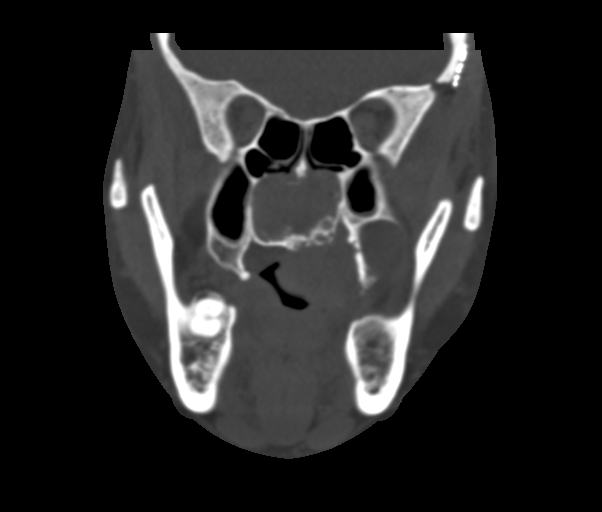

[Series 8: sagittal soft tissue · sagittal · 0.30mm/px · 3 of 92 slices shown]
[im 31/92  bone]
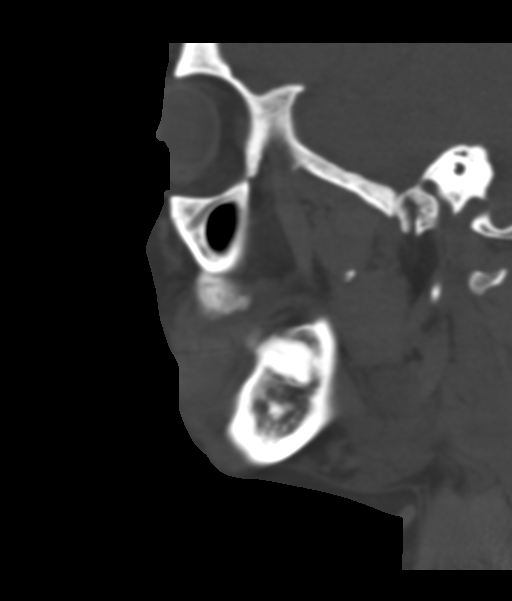
[im 46/92  bone]
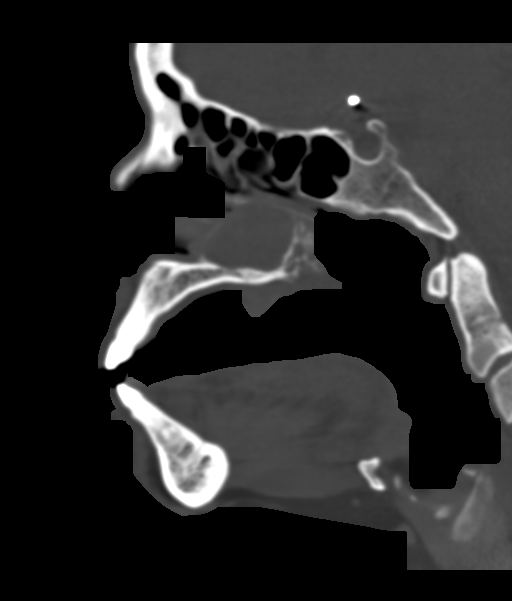
[im 61/92  bone]
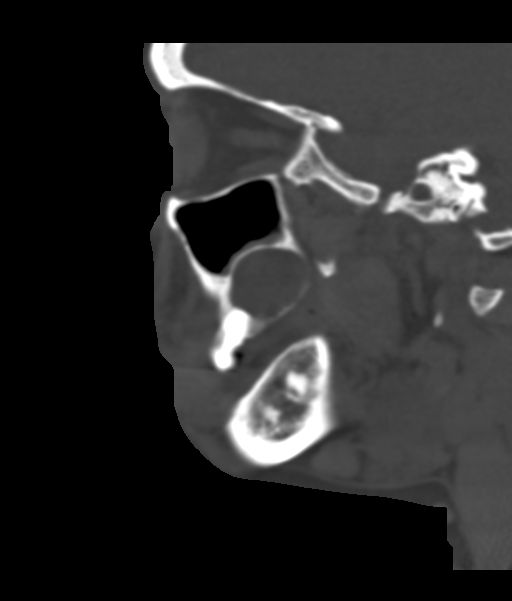

[14 of 47 positions shown; findings below may reference images not displayed]

FINDINGS: Osseous: There is irregularity and destructive change of the hard
palate posteriorly in the region of an approximately 3.7 cm AP x
cm TV x 2.2 cm solid mass which is centered in the left aspect of
the hard palate, increased in size from 3.1 cm by 2.3 cm by 2.1 cm
in 8725. The mass extends into the inferior aspect of the posterior
nasal cavity.

There is soft tissue density in the nasal cavity centered at the
midline measuring 2.6 cm TV x 2.6 cm AP x 1.9 cm cc with smooth
expansile bony remodeling of the nasal septum (10-50, 3-50). This is
new since 8725.

There is cystic change in the left maxillary alveolar ridge which is
new since 8725 measuring up to approximately 2.1 cm cc associated
with root of the posterior most remaining maxillary tooth (10-59).
The density of the cystic changes just above that of simple fluid.
The surrounding bone is smoothly remodeled without osseous
destruction.

Postsurgical changes are noted in the left lateral orbital wall.
There is no acute osseous abnormality.

Orbits: The globes and orbits are unremarkable.

Sinuses: There is mild mucosal thickening along the floors of the
maxillary sinuses and in the ethmoid air cells.

Soft tissues: The soft tissues are otherwise unremarkable.

Limited intracranial: Postsurgical changes reflecting aneurysm
clipping are noted. Imaged portions of the intracranial compartment
are otherwise unremarkable.
IMPRESSION: 1. Solid mass centered in the left hard palate with associated
osseous destruction and remodeling is increased in size since [DATE]. Soft tissue lesion in the midline nasal cavity with smooth
expansile bony remodeling of the nasal septum is new since [DATE]. Recommend tissue sampling and ENT referral for both of the above
lesions, if not already referred.
4. Cystic lesion in the left maxillary alveolar ridge is favored to
reflect a radicular cyst related to the posterior most remaining
maxillary tooth as the root of the tooth protrudes into the cyst

## 2023-11-02 ENCOUNTER — Encounter: Payer: Self-pay | Admitting: Advanced Practice Midwife
